# Patient Record
Sex: Female | Born: 1985 | Race: White | Hispanic: No | Marital: Single | Smoking: Current some day smoker
Health system: Southern US, Community
[De-identification: ages and names within clinical notes are randomized; demographics above are authoritative.]

## PROBLEM LIST (undated history)

## (undated) DIAGNOSIS — F319 Bipolar disorder, unspecified: Secondary | ICD-10-CM

## (undated) DIAGNOSIS — J449 Chronic obstructive pulmonary disease, unspecified: Secondary | ICD-10-CM

## (undated) DIAGNOSIS — I509 Heart failure, unspecified: Secondary | ICD-10-CM

## (undated) DIAGNOSIS — F17200 Nicotine dependence, unspecified, uncomplicated: Secondary | ICD-10-CM

## (undated) DIAGNOSIS — M5417 Radiculopathy, lumbosacral region: Secondary | ICD-10-CM

## (undated) DIAGNOSIS — M5030 Other cervical disc degeneration, unspecified cervical region: Secondary | ICD-10-CM

## (undated) DIAGNOSIS — F411 Generalized anxiety disorder: Secondary | ICD-10-CM

## (undated) DIAGNOSIS — J45909 Unspecified asthma, uncomplicated: Secondary | ICD-10-CM

## (undated) DIAGNOSIS — B1920 Unspecified viral hepatitis C without hepatic coma: Secondary | ICD-10-CM

## (undated) DIAGNOSIS — G5600 Carpal tunnel syndrome, unspecified upper limb: Secondary | ICD-10-CM

## (undated) DIAGNOSIS — M5136 Other intervertebral disc degeneration, lumbar region: Secondary | ICD-10-CM

## (undated) HISTORY — PX: TONSILLECTOMY: SUR1361

## (undated) HISTORY — PX: TUBAL LIGATION: SHX77

## (undated) HISTORY — DX: Carpal tunnel syndrome, unspecified upper limb: G56.00

## (undated) HISTORY — DX: Radiculopathy, lumbosacral region: M54.17

## (undated) HISTORY — DX: Generalized anxiety disorder: F41.1

## (undated) HISTORY — DX: Other cervical disc degeneration, unspecified cervical region: M50.30

## (undated) HISTORY — DX: Nicotine dependence, unspecified, uncomplicated: F17.200

## (undated) HISTORY — DX: Other intervertebral disc degeneration, lumbar region: M51.36

## (undated) HISTORY — DX: Unspecified asthma, uncomplicated: J45.909

## (undated) HISTORY — DX: Unspecified viral hepatitis C without hepatic coma: B19.20

## (undated) HISTORY — PX: CERVICAL DISC ARTHROPLASTY: SHX587

## (undated) HISTORY — DX: Bipolar disorder, unspecified: F31.9

---
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
## 2009-09-03 DIAGNOSIS — F411 Generalized anxiety disorder: Secondary | ICD-10-CM

## 2009-09-03 HISTORY — DX: Generalized anxiety disorder: F41.1

## 2012-09-06 DIAGNOSIS — F17200 Nicotine dependence, unspecified, uncomplicated: Secondary | ICD-10-CM

## 2012-09-06 HISTORY — DX: Nicotine dependence, unspecified, uncomplicated: F17.200

## 2012-11-23 DIAGNOSIS — J45909 Unspecified asthma, uncomplicated: Secondary | ICD-10-CM | POA: Insufficient documentation

## 2012-11-23 DIAGNOSIS — M436 Torticollis: Secondary | ICD-10-CM | POA: Insufficient documentation

## 2012-11-23 DIAGNOSIS — M542 Cervicalgia: Secondary | ICD-10-CM | POA: Insufficient documentation

## 2012-12-18 DIAGNOSIS — B1920 Unspecified viral hepatitis C without hepatic coma: Secondary | ICD-10-CM | POA: Insufficient documentation

## 2013-02-09 DIAGNOSIS — F319 Bipolar disorder, unspecified: Secondary | ICD-10-CM

## 2013-02-09 HISTORY — DX: Bipolar disorder, unspecified: F31.9

## 2013-03-02 DIAGNOSIS — F329 Major depressive disorder, single episode, unspecified: Secondary | ICD-10-CM | POA: Insufficient documentation

## 2013-03-02 DIAGNOSIS — G8929 Other chronic pain: Secondary | ICD-10-CM | POA: Insufficient documentation

## 2013-04-20 ENCOUNTER — Emergency Department: Payer: Self-pay | Admitting: Emergency Medicine

## 2013-04-20 LAB — CBC WITH DIFFERENTIAL/PLATELET
Basophil #: 0.1 10*3/uL (ref 0.0–0.1)
Basophil %: 0.5 %
Eosinophil #: 0.1 10*3/uL (ref 0.0–0.7)
Eosinophil %: 1 %
HCT: 38.2 % (ref 35.0–47.0)
HGB: 13.5 g/dL (ref 12.0–16.0)
Lymphocyte #: 3 10*3/uL (ref 1.0–3.6)
Lymphocyte %: 21.9 %
MCH: 33.5 pg (ref 26.0–34.0)
MCHC: 35.2 g/dL (ref 32.0–36.0)
MCV: 95 fL (ref 80–100)
Monocyte #: 0.9 x10 3/mm (ref 0.2–0.9)
Monocyte %: 6.4 %
Neutrophil #: 9.4 10*3/uL — ABNORMAL HIGH (ref 1.4–6.5)
Neutrophil %: 70.2 %
Platelet: 169 10*3/uL (ref 150–440)
RBC: 4.02 10*6/uL (ref 3.80–5.20)
RDW: 12.8 % (ref 11.5–14.5)
WBC: 13.4 10*3/uL — ABNORMAL HIGH (ref 3.6–11.0)

## 2013-04-20 LAB — BASIC METABOLIC PANEL
Anion Gap: 4 — ABNORMAL LOW (ref 7–16)
BUN: 10 mg/dL (ref 7–18)
Calcium, Total: 8.6 mg/dL (ref 8.5–10.1)
Chloride: 108 mmol/L — ABNORMAL HIGH (ref 98–107)
Co2: 27 mmol/L (ref 21–32)
Creatinine: 0.86 mg/dL (ref 0.60–1.30)
EGFR (African American): >60
EGFR (Non-African Amer.): >60
Glucose: 101 mg/dL — ABNORMAL HIGH (ref 65–99)
Osmolality: 277 (ref 275–301)
Potassium: 3.8 mmol/L (ref 3.5–5.1)
Sodium: 139 mmol/L (ref 136–145)

## 2013-04-20 LAB — GC/CHLAMYDIA PROBE AMP

## 2013-04-21 ENCOUNTER — Emergency Department: Payer: Self-pay | Admitting: Emergency Medicine

## 2013-04-25 LAB — CULTURE, BLOOD (SINGLE)

## 2013-09-03 ENCOUNTER — Ambulatory Visit: Payer: Self-pay | Admitting: Physician Assistant

## 2013-12-27 ENCOUNTER — Other Ambulatory Visit: Payer: Self-pay

## 2013-12-27 LAB — SEDIMENTATION RATE: ERYTHROCYTE SED RATE: 5 mm/h (ref 0–20)

## 2013-12-27 LAB — CBC WITH DIFFERENTIAL/PLATELET
BASOS ABS: 0.1 10*3/uL (ref 0.0–0.1)
Basophil %: 0.6 %
EOS ABS: 0.2 10*3/uL (ref 0.0–0.7)
Eosinophil %: 1.4 %
HCT: 39.6 % (ref 35.0–47.0)
HGB: 13.4 g/dL (ref 12.0–16.0)
LYMPHS ABS: 3.8 10*3/uL — AB (ref 1.0–3.6)
Lymphocyte %: 32.8 %
MCH: 33 pg (ref 26.0–34.0)
MCHC: 33.9 g/dL (ref 32.0–36.0)
MCV: 97 fL (ref 80–100)
MONO ABS: 0.6 x10 3/mm (ref 0.2–0.9)
Monocyte %: 5.4 %
Neutrophil #: 7 10*3/uL — ABNORMAL HIGH (ref 1.4–6.5)
Neutrophil %: 59.8 %
Platelet: 210 10*3/uL (ref 150–440)
RBC: 4.08 10*6/uL (ref 3.80–5.20)
RDW: 13.2 % (ref 11.5–14.5)
WBC: 11.6 10*3/uL — ABNORMAL HIGH (ref 3.6–11.0)

## 2014-03-15 ENCOUNTER — Emergency Department: Payer: Self-pay | Admitting: Emergency Medicine

## 2014-03-22 ENCOUNTER — Emergency Department: Payer: Self-pay | Admitting: Internal Medicine

## 2014-04-25 ENCOUNTER — Emergency Department: Payer: Self-pay | Admitting: Emergency Medicine

## 2014-05-08 ENCOUNTER — Ambulatory Visit: Payer: Self-pay | Admitting: Pain Medicine

## 2014-05-16 ENCOUNTER — Emergency Department: Payer: Self-pay | Admitting: Emergency Medicine

## 2014-06-03 ENCOUNTER — Ambulatory Visit: Payer: Self-pay | Admitting: Pain Medicine

## 2014-06-04 ENCOUNTER — Ambulatory Visit: Payer: Self-pay | Admitting: Pain Medicine

## 2014-07-09 ENCOUNTER — Ambulatory Visit: Payer: Self-pay | Admitting: Pain Medicine

## 2014-07-24 ENCOUNTER — Ambulatory Visit: Payer: Self-pay | Admitting: Pain Medicine

## 2014-08-12 ENCOUNTER — Emergency Department: Payer: Self-pay | Admitting: Student

## 2014-08-12 LAB — CBC WITH DIFFERENTIAL/PLATELET
BASOS ABS: 0 10*3/uL (ref 0.0–0.1)
Basophil %: 0.3 %
EOS ABS: 0.3 10*3/uL (ref 0.0–0.7)
Eosinophil %: 1.7 %
HCT: 41.1 % (ref 35.0–47.0)
HGB: 14.1 g/dL (ref 12.0–16.0)
Lymphocyte #: 3.3 10*3/uL (ref 1.0–3.6)
Lymphocyte %: 20.6 %
MCH: 32.8 pg (ref 26.0–34.0)
MCHC: 34.3 g/dL (ref 32.0–36.0)
MCV: 96 fL (ref 80–100)
MONO ABS: 1.1 x10 3/mm — AB (ref 0.2–0.9)
Monocyte %: 6.6 %
NEUTROS ABS: 11.3 10*3/uL — AB (ref 1.4–6.5)
Neutrophil %: 70.8 %
Platelet: 173 10*3/uL (ref 150–440)
RBC: 4.3 10*6/uL (ref 3.80–5.20)
RDW: 12.3 % (ref 11.5–14.5)
WBC: 16 10*3/uL — ABNORMAL HIGH (ref 3.6–11.0)

## 2014-08-12 LAB — COMPREHENSIVE METABOLIC PANEL
ALK PHOS: 64 U/L (ref 46–116)
ANION GAP: 8 (ref 7–16)
AST: 60 U/L — AB (ref 15–37)
Albumin: 3.8 g/dL (ref 3.4–5.0)
BILIRUBIN TOTAL: 0.6 mg/dL (ref 0.2–1.0)
BUN: 16 mg/dL (ref 7–18)
CALCIUM: 9.3 mg/dL (ref 8.5–10.1)
Chloride: 107 mmol/L (ref 98–107)
Co2: 27 mmol/L (ref 21–32)
Creatinine: 0.9 mg/dL (ref 0.60–1.30)
EGFR (African American): >60
EGFR (Non-African Amer.): >60
Glucose: 98 mg/dL (ref 65–99)
Osmolality: 284 (ref 275–301)
Potassium: 4 mmol/L (ref 3.5–5.1)
SGPT (ALT): 172 U/L — ABNORMAL HIGH (ref 14–63)
Sodium: 142 mmol/L (ref 136–145)
Total Protein: 7.2 g/dL (ref 6.4–8.2)

## 2014-08-12 LAB — WET PREP, GENITAL

## 2014-08-12 LAB — HCG, QUANTITATIVE, PREGNANCY: Beta Hcg, Quant.: <1 m[IU]/mL — ABNORMAL LOW

## 2014-08-12 LAB — TROPONIN I

## 2014-08-13 LAB — GC/CHLAMYDIA PROBE AMP

## 2014-09-19 ENCOUNTER — Ambulatory Visit: Payer: Self-pay | Admitting: Pain Medicine

## 2014-09-30 ENCOUNTER — Ambulatory Visit: Payer: Self-pay | Admitting: Pain Medicine

## 2014-10-10 DIAGNOSIS — G5600 Carpal tunnel syndrome, unspecified upper limb: Secondary | ICD-10-CM | POA: Insufficient documentation

## 2014-10-10 HISTORY — DX: Carpal tunnel syndrome, unspecified upper limb: G56.00

## 2014-10-15 ENCOUNTER — Other Ambulatory Visit
Admit: 2014-10-15 | Disposition: A | Discharge status: Home / Self Care | Payer: Self-pay | Attending: Urgent Care | Admitting: Urgent Care

## 2014-10-15 LAB — CBC WITH DIFFERENTIAL/PLATELET
BASOS ABS: 0.1 10*3/uL (ref 0.0–0.1)
Basophil %: 0.6 %
EOS ABS: 0.2 10*3/uL (ref 0.0–0.7)
EOS PCT: 1.3 %
HCT: 39.1 % (ref 35.0–47.0)
HGB: 13 g/dL (ref 12.0–16.0)
LYMPHS PCT: 23.7 %
Lymphocyte #: 3 10*3/uL (ref 1.0–3.6)
MCH: 32.7 pg (ref 26.0–34.0)
MCHC: 33.4 g/dL (ref 32.0–36.0)
MCV: 98 fL (ref 80–100)
MONOS PCT: 5.5 %
Monocyte #: 0.7 x10 3/mm (ref 0.2–0.9)
Neutrophil #: 8.8 10*3/uL — ABNORMAL HIGH (ref 1.4–6.5)
Neutrophil %: 68.9 %
PLATELETS: 175 10*3/uL (ref 150–440)
RBC: 3.99 10*6/uL (ref 3.80–5.20)
RDW: 13 % (ref 11.5–14.5)
WBC: 12.7 10*3/uL — ABNORMAL HIGH (ref 3.6–11.0)

## 2014-10-15 LAB — HEPATIC FUNCTION PANEL A (ARMC)
AST: 27 U/L
Albumin: 3.8 g/dL
Alkaline Phosphatase: 70 U/L
Bilirubin,Total: 0.2 mg/dL — ABNORMAL LOW
Indirect Bilirubin: 0.2
SGPT (ALT): 40 U/L
Total Protein: 6.8 g/dL

## 2014-10-31 ENCOUNTER — Ambulatory Visit
Admit: 2014-10-31 | Disposition: A | Discharge status: Home / Self Care | Payer: Self-pay | Attending: Urgent Care | Admitting: Urgent Care

## 2014-10-31 ENCOUNTER — Ambulatory Visit
Admit: 2014-10-31 | Disposition: A | Discharge status: Home / Self Care | Payer: Self-pay | Attending: Pain Medicine | Admitting: Pain Medicine

## 2014-11-13 ENCOUNTER — Encounter: Payer: Self-pay | Admitting: Pain Medicine

## 2014-11-13 ENCOUNTER — Ambulatory Visit: Payer: BLUE CROSS/BLUE SHIELD | Attending: Pain Medicine | Admitting: Pain Medicine

## 2014-11-13 VITALS — BP 107/70 | HR 75 | Temp 98.4°F | Resp 16 | Ht 63.0 in | Wt 145.0 lb

## 2014-11-13 DIAGNOSIS — R0782 Intercostal pain: Secondary | ICD-10-CM

## 2014-11-13 DIAGNOSIS — M5417 Radiculopathy, lumbosacral region: Secondary | ICD-10-CM

## 2014-11-13 DIAGNOSIS — M19111 Post-traumatic osteoarthritis, right shoulder: Secondary | ICD-10-CM

## 2014-11-13 DIAGNOSIS — M5136 Other intervertebral disc degeneration, lumbar region: Secondary | ICD-10-CM

## 2014-11-13 DIAGNOSIS — M5030 Other cervical disc degeneration, unspecified cervical region: Secondary | ICD-10-CM | POA: Insufficient documentation

## 2014-11-13 DIAGNOSIS — M5442 Lumbago with sciatica, left side: Secondary | ICD-10-CM | POA: Diagnosis not present

## 2014-11-13 DIAGNOSIS — M51369 Other intervertebral disc degeneration, lumbar region without mention of lumbar back pain or lower extremity pain: Secondary | ICD-10-CM

## 2014-11-13 DIAGNOSIS — M545 Low back pain: Secondary | ICD-10-CM | POA: Diagnosis present

## 2014-11-13 HISTORY — DX: Other intervertebral disc degeneration, lumbar region without mention of lumbar back pain or lower extremity pain: M51.369

## 2014-11-13 HISTORY — DX: Other cervical disc degeneration, unspecified cervical region: M50.30

## 2014-11-13 HISTORY — DX: Radiculopathy, lumbosacral region: M54.17

## 2014-11-13 HISTORY — DX: Other intervertebral disc degeneration, lumbar region: M51.36

## 2014-11-13 MED ORDER — LIDOCAINE HCL (PF) 1 % IJ SOLN
INTRAMUSCULAR | Status: AC
  Administered 2014-11-13: 12:00:00
  Filled 2014-11-13: qty 5

## 2014-11-13 MED ORDER — ORPHENADRINE CITRATE 30 MG/ML IJ SOLN
INTRAMUSCULAR | Status: AC
  Administered 2014-11-13: 12:00:00
  Filled 2014-11-13: qty 2

## 2014-11-13 MED ORDER — TRIAMCINOLONE ACETONIDE 40 MG/ML IJ SUSP
INTRAMUSCULAR | Status: AC
  Administered 2014-11-13: 12:00:00
  Filled 2014-11-13: qty 1

## 2014-11-13 MED ORDER — BUPIVACAINE HCL (PF) 0.25 % IJ SOLN: 30.0000 mL | Freq: Once | INTRAMUSCULAR | Status: DC

## 2014-11-13 MED ORDER — FENTANYL CITRATE (PF) 100 MCG/2ML IJ SOLN: 100.0000 ug | INTRAMUSCULAR | Status: DC

## 2014-11-13 MED ORDER — BUPIVACAINE HCL (PF) 0.25 % IJ SOLN
INTRAMUSCULAR | Status: AC
  Administered 2014-11-13: 12:00:00
  Filled 2014-11-13: qty 30

## 2014-11-13 MED ORDER — FENTANYL CITRATE (PF) 100 MCG/2ML IJ SOLN
INTRAMUSCULAR | Status: AC
  Administered 2014-11-13: 100 ug
  Filled 2014-11-13: qty 2

## 2014-11-13 MED ORDER — LIDOCAINE HCL (PF) 1 % IJ SOLN: 5.0000 mL | Freq: Once | INTRAMUSCULAR | Status: DC

## 2014-11-13 MED ORDER — ORPHENADRINE CITRATE 30 MG/ML IJ SOLN: 60.0000 mg | Freq: Once | INTRAMUSCULAR | Status: DC

## 2014-11-13 MED ORDER — MIDAZOLAM HCL 5 MG/5ML IJ SOLN
INTRAMUSCULAR | Status: AC
  Administered 2014-11-13: 3 mg
  Filled 2014-11-13: qty 5

## 2014-11-13 MED ORDER — SODIUM CHLORIDE 0.9 % IJ SOLN: 10.0000 mL | Freq: Once | INTRAMUSCULAR | Status: DC

## 2014-11-13 MED ORDER — TRIAMCINOLONE ACETONIDE 40 MG/ML IJ SUSP
INTRAMUSCULAR | Status: AC
  Filled 2014-11-13: qty 1

## 2014-11-13 MED ORDER — LACTATED RINGERS IV SOLN: 1000.0000 mL | INTRAVENOUS | Status: DC

## 2014-11-13 MED ORDER — TRIAMCINOLONE ACETONIDE 40 MG/ML IJ SUSP: 40.0000 mg | Freq: Once | INTRAMUSCULAR | Status: DC

## 2014-11-13 MED ORDER — SODIUM CHLORIDE 0.9 % IJ SOLN
INTRAMUSCULAR | Status: AC
  Administered 2014-11-13: 20 mL
  Filled 2014-11-13: qty 20

## 2014-11-13 MED ORDER — MIDAZOLAM HCL 5 MG/5ML IJ SOLN: 5.0000 mg | INTRAMUSCULAR | Status: DC

## 2014-11-13 NOTE — Procedures (Signed)
PROCEDURE PERFORMED: Lumbar epidural steroid injection   NOTE: The patient is a 29 y.o.-year-old female who [facility] and treatment of pain involving the lumbar and lower extremity region. MRI studies have revealed the patient to be withvIMPRESSION 1. L2-3: MILD FACET PROMINENCE. 2. L3-4: BILATERAL FACET AND LIGAMENTAL FLAVAL PROMINENCE. 3. L4-5: BROAD BASED ANNULAR BULGE. 4. L5-S1: CENTRAL BROAD BASED BULGE.IMPRESSION 1. L2-3: MILD FACET PROMINENCE. 2. L3-4: BILATERAL FACET AND LIGAMENTAL FLAVAL PROMINENCE. 3. L4-5: BROAD BASED ANNULAR BULGE. 4. L5-S1: CENTRAL BROAD BASED BULGE. Marland Kitchen The risks, benefits, and expectations of the procedure have been discussed and explained to the patient who was understanding and in agreement with suggested treatment plan. We will proceed with interventional treatment as discussed and explained to the patient who is willing to proceed with procedure as planned.   DESCRIPTION OF PROCEDURE: Lumbar epidural steroid injection with IV Versed, IV fentanyl conscious sedation, EKG, blood pressure, pulse, and pulse oximetry monitoring. The procedure was performed with the patient in the prone position under fluoroscopic guidance. A local anesthetic skin wheal of 1.5% plain lidocaine was accomplished at proposed entry site. An 18-gauge Tuohy epidural needle was inserted at the L 5 vertebral body level left of the midline via loss-of-resistance technique with negative heme and negative CSF return. A total of 4 mL of Preservative-Free normal saline with 40 mg of Kenalog injected incrementally via epidurally placed needle. Needle removed. The patient tolerated the injection well.   PLAN:   1. Medications: We will continue presently prescribed medications. 2. Will consider modification of treatment regimen pending response to treatment rendered on today's visit and follow-up evaluation. 3. The patient is to follow-up  with primary care physician regarding blood pressure and general medical condition status post lumbar epidural steroid injection performed on today's visit. 4. Surgical evaluation. 5. Neurological evaluation. 6. The patient may be a candidate for radiofrequency procedures, implantation device, and other treatment pending response to treatment and follow-up evaluation. 7. The patient has been advised to adhere to proper body mechanics and avoid activities which appear to aggravate condition. 8. The patient has been advised to call the [facility] prior to scheduled return appointment should there be significant change in condition or should there be significant  1. Medications: We will continue presently prescribed medications.  2. Will consider modification of treatment regimen pending response to treatment rendered on today's visit and follow-up evaluation.  3. The patient is to follow-up with primary care physician regarding blood pressure and general medical condition status post lumbar epidural steroid injection performed on today's visit.  4. Surgical evaluation.  5. Neurological evaluation. 6. The patient may be a candidate for radiofrequency procedures, implantation device, and other treatment pending response to treatment and follow-up evaluation.  7. The patient has been advised to adhere to proper body mechanics and avoid activities which appear to aggravate condition.  8. The patient has been advised to call the [facility] prior to scheduled return appointment should there be significant change in condition or should should patient have other concerns regarding condition prior to scheduled return appointment.  The patient is understanding and in agreement with suggested treatment plan.

## 2014-11-13 NOTE — Progress Notes (Signed)
   Subjective:    Patient ID: Jennifer Vasquez, female    DOB: [birth year], 29 y.o.   MRN: [id number]  HPI    Review of Systems     Objective:   Physical Exam        Assessment & Plan:

## 2014-11-13 NOTE — Progress Notes (Signed)
   Subjective:    Patient ID: Jennifer Vasquez, female    DOB: 02/15/1986, 28 y.o.   MRN: [id number]  HPI    Review of Systems     Objective:   Physical Exam        Assessment & Plan:   

## 2014-11-13 NOTE — Patient Instructions (Addendum)
F/U  DR E TURNER FOR EVAL BP AND GENERAL  CONDITIONS  F/U SURGICAL AND NEUROLOGICAL EVAL   AS DISCUSSED  F/U PSYCH EVAL  AS DISCUSSED  F/U EVAL IN 3 - 4 WKS    Epidural Steroid Injection, Care After  Refer to this sheet in the next few weeks. These instructions provide you with information on caring for yourself after your procedure. Your health care provider may also give you more specific instructions. Your treatment has been planned according to current medical practices, but problems sometimes occur. Call your health care provider if you have any problems or questions after your procedure. WHAT TO EXPECT AFTER THE PROCEDURE After your procedure, it is typical to have minimal discomfort at the injection site. HOME CARE INSTRUCTIONS   Avoid the use of heat on the injection site for 1 day.  Do not take a tub bath or soak in water the day of the procedure.  Remove the bandage on the day after the procedure.  Resume your normal activities the day after the procedure.  Apply ice to reduce the soreness around the injection site:  Put ice in a plastic bag.  Place a towel between your skin and the bag.  Leave the ice on for 20 minutes, 2-3 times a day.  Follow up with your health care provider 7-10 days after the procedure. SEEK MEDICAL CARE IF:   You have a fever.  You continue to have pain and soreness around the injection site, even after taking medicines.  You have significant nausea or vomiting.  You have a severe headache. SEEK IMMEDIATE MEDICAL CARE IF:   You have severe pain at the injection site, which is not relieved by medicines.  You have a severe headache, stiff neck, or sensitivity to light.  You have any new numbness or weakness in your legs or arms.  You lose control over your bladder or bowel movements.  You have difficulty breathing. Document Released: 10/13/2010 Document Revised: 02/28/2013 Document Reviewed: 12/15/2012 [facility] Patient Information  2015 [place], [place]. This information is not intended to replace advice given to you by your health care provider. Make sure you discuss any questions you have with your health care provider.

## 2014-11-13 NOTE — Progress Notes (Signed)
Discharged to home via w/c with mother-in-law at 12:35 pm. Tolerating po fluids. Instructions given; teachback x3.

## 2014-11-14 ENCOUNTER — Telehealth: Payer: Self-pay | Admitting: *Deleted

## 2014-11-14 NOTE — Telephone Encounter (Signed)
Left voice mail

## 2014-12-15 ENCOUNTER — Other Ambulatory Visit: Payer: Self-pay | Admitting: Pain Medicine

## 2014-12-15 DIAGNOSIS — M5136 Other intervertebral disc degeneration, lumbar region: Secondary | ICD-10-CM

## 2014-12-15 DIAGNOSIS — M5030 Other cervical disc degeneration, unspecified cervical region: Secondary | ICD-10-CM

## 2014-12-15 DIAGNOSIS — M5417 Radiculopathy, lumbosacral region: Secondary | ICD-10-CM

## 2014-12-17 ENCOUNTER — Emergency Department: Payer: BLUE CROSS/BLUE SHIELD

## 2014-12-17 ENCOUNTER — Encounter: Payer: Self-pay | Admitting: *Deleted

## 2014-12-17 ENCOUNTER — Emergency Department
Admission: EM | Admit: 2014-12-17 | Discharge: 2014-12-17 | Disposition: A | Discharge status: Home / Self Care | Payer: BLUE CROSS/BLUE SHIELD | Attending: Emergency Medicine | Admitting: Emergency Medicine

## 2014-12-17 DIAGNOSIS — Z3202 Encounter for pregnancy test, result negative: Secondary | ICD-10-CM | POA: Diagnosis not present

## 2014-12-17 DIAGNOSIS — Z79899 Other long term (current) drug therapy: Secondary | ICD-10-CM | POA: Diagnosis not present

## 2014-12-17 DIAGNOSIS — Z880 Allergy status to penicillin: Secondary | ICD-10-CM | POA: Insufficient documentation

## 2014-12-17 DIAGNOSIS — Z720 Tobacco use: Secondary | ICD-10-CM | POA: Insufficient documentation

## 2014-12-17 DIAGNOSIS — N760 Acute vaginitis: Secondary | ICD-10-CM | POA: Insufficient documentation

## 2014-12-17 DIAGNOSIS — R109 Unspecified abdominal pain: Secondary | ICD-10-CM

## 2014-12-17 DIAGNOSIS — N938 Other specified abnormal uterine and vaginal bleeding: Secondary | ICD-10-CM | POA: Diagnosis not present

## 2014-12-17 DIAGNOSIS — B9689 Other specified bacterial agents as the cause of diseases classified elsewhere: Secondary | ICD-10-CM

## 2014-12-17 LAB — CHLAMYDIA/NGC RT PCR (ARMC ONLY)
CHLAMYDIA TR: NOT DETECTED
N gonorrhoeae: NOT DETECTED

## 2014-12-17 LAB — WET PREP, GENITAL
TRICH WET PREP: NONE SEEN
Yeast Wet Prep HPF POC: NONE SEEN

## 2014-12-17 LAB — POCT PREGNANCY, URINE: Preg Test, Ur: NEGATIVE

## 2014-12-17 MED ORDER — MORPHINE SULFATE 4 MG/ML IJ SOLN
4.0000 mg | Freq: Once | INTRAMUSCULAR | Status: AC
  Administered 2014-12-17: 4 mg via INTRAMUSCULAR

## 2014-12-17 MED ORDER — METRONIDAZOLE 500 MG PO TABS: 500.0000 mg | ORAL_TABLET | Freq: Two times a day (BID) | ORAL | Status: DC

## 2014-12-17 MED ORDER — NAPROXEN 500 MG PO TABS: 500.0000 mg | ORAL_TABLET | Freq: Two times a day (BID) | ORAL | Status: DC

## 2014-12-17 MED ORDER — OXYCODONE HCL 5 MG PO TABS
5.0000 mg | ORAL_TABLET | Freq: Once | ORAL | Status: AC
  Administered 2014-12-17: 5 mg via ORAL

## 2014-12-17 MED ORDER — MORPHINE SULFATE 4 MG/ML IJ SOLN
INTRAMUSCULAR | Status: AC
  Administered 2014-12-17: 4 mg via INTRAMUSCULAR
  Filled 2014-12-17: qty 1

## 2014-12-17 MED ORDER — OXYCODONE HCL 5 MG PO TABS
ORAL_TABLET | ORAL | Status: AC
  Administered 2014-12-17: 5 mg via ORAL
  Filled 2014-12-17: qty 1

## 2014-12-17 NOTE — Discharge Instructions (Signed)
Pelvic Pain Pelvic pain is pain felt below the belly button and between your hips. It can be caused by many different things. It is important to get help right away. This is especially true for severe, sharp, or unusual pain that comes on suddenly.  HOME CARE  Only take medicine as told by your doctor.  Rest as told by your doctor.  Eat a healthy diet, such as fruits, vegetables, and lean meats.  Drink enough fluids to keep your pee (urine) clear or pale yellow, or as told.  Avoid sex (intercourse) if it causes pain.  Apply warm or cold packs to your lower belly (abdomen). Use the type of pack that helps the pain.  Avoid situations that cause you stress.  Keep a journal to track your pain. Write down:  When the pain started.  Where it is located.  If there are things that seem to be related to the pain, such as food or your period.  Follow up with your doctor as told. GET HELP RIGHT AWAY IF:   You have heavy bleeding from the vagina.  You have more pelvic pain.  You feel lightheaded or pass out (faint).  You have chills.  You have pain when you pee or have blood in your pee.  You cannot stop having watery poop (diarrhea).  You cannot stop throwing up (vomiting).  You have a fever or lasting symptoms for more than 3 days.  You have a fever and your symptoms suddenly get worse.  You are being physically or sexually abused.  Your medicine does not help your pain.  You have fluid (discharge) coming from your vagina that is not normal. MAKE SURE YOU:  Understand these instructions.  Will watch your condition.  Will get help if you are not doing well or get worse. Document Released: 12/15/2007 Document Revised: 12/28/2011 Document Reviewed: 10/18/2011 [employer] Patient Information 2015 [employer]. This information is not intended to replace advice given to you by your health care provider. Make sure you discuss any questions you have with your health care  provider.  

## 2014-12-17 NOTE — ED Notes (Signed)
Pt informed to return if any life threatening symptoms occur.  

## 2014-12-17 NOTE — ED Provider Notes (Signed)
[facility] Emergency Department Provider Note  ____________________________________________  Time seen: Approximately 1250 PM  I have reviewed the triage vital signs and the nursing notes.   HISTORY  Chief Complaint Vaginal Bleeding    HPI SERI KIMMER is a 29 y.o. female history of chronic back pain presents today with 1 day of vaginal discharge and bleeding with suprapubic abdominal pain. She said she was having a white discharge yesterday and then had cramping to the lower abdomen with bleeding and passing clots today. She says that she has a regular period with only certain the 24th of the month. She usually does not have any abnormal bleeding. She says that her mother had to have her uterus out at about this age but does not know why. Has one sexual partner which she has been exclusive with for the past 10 years. No history of STDs.Mild pain with urination.   Past Medical History  Diagnosis Date  . Asthma   . Hepatitis C     Patient Active Problem List   Diagnosis Date Noted  . DDD (degenerative disc disease), lumbar 11/13/2014  . DDD (degenerative disc disease), cervical 11/13/2014  . Lumbosacral radiculopathy 11/13/2014    Past Surgical History  Procedure Laterality Date  . Tonsillectomy    . Tubal ligation      Current Outpatient Rx  Name  Route  Sig  Dispense  Refill  . albuterol-ipratropium (COMBIVENT) 18-103 MCG/ACT inhaler   Inhalation   Inhale 2 puffs into the lungs 3 (three) times daily.         Marland Kitchen ibuprofen (ADVIL,MOTRIN) 200 MG tablet   Oral   Take 800 mg by mouth every 6 (six) hours as needed.         . traZODone (DESYREL) 50 MG tablet   Oral   Take 50 mg by mouth at bedtime as needed for sleep.           Allergies Acetaminophen; Amoxicillin-pot clavulanate; Gabapentin; Tramadol; and Shellfish allergy  Family History  Problem Relation Age of Onset  . Arthritis Mother   . Asthma Mother   . Diabetes Mother    . Hyperlipidemia Mother   . Vision loss Mother   . Alcohol abuse Father   . Asthma Father   . Diabetes Father   . Heart disease Father   . Depression Father   . COPD Father   . Cancer Father   . Hypertension Father   . Kidney disease Father   . Vision loss Father     Social History History  Substance Use Topics  . Smoking status: Current Some Day Smoker -- 5.00 packs/day    Types: Cigarettes  . Smokeless tobacco: Not on file  . Alcohol Use: No    Review of Systems Constitutional: No fever/chills Eyes: No visual changes. ENT: No sore throat. Cardiovascular: Denies chest pain. Respiratory: Denies shortness of breath. Gastrointestinal: No abdominal pain.  No nausea, no vomiting.  No diarrhea.  No constipation. Genitourinary: As above  Musculoskeletal: Negative for back pain. Skin: Negative for rash. Neurological: Negative for headaches, focal weakness or numbness.  10-point ROS otherwise negative.  ____________________________________________   PHYSICAL EXAM:  VITAL SIGNS: ED Triage Vitals  Enc Vitals Group     BP 12/17/14 0902 138/78 mmHg     Pulse Rate 12/17/14 0902 92     Resp 12/17/14 0902 18     Temp 12/17/14 0902 98.2 F (36.8 C)     Temp Source 12/17/14 0902  Oral     SpO2 12/17/14 0902 97 %     Weight 12/17/14 0902 145 lb (65.772 kg)     Height 12/17/14 0902 5\' 3"  (1.6 m)     Head Cir --      Peak Flow --      Pain Score 12/17/14 0905 6     Pain Loc --      Pain Edu? --      Excl. in GC? --     Constitutional: Alert and oriented. Well appearing and in no acute distress. Eyes: Conjunctivae are normal. PERRL. EOMI. Head: Atraumatic. Nose: No congestion/rhinnorhea. Mouth/Throat: Mucous membranes are moist.  Oropharynx non-erythematous. Neck: No stridor.   Cardiovascular: Normal rate, regular rhythm. Grossly normal heart sounds.  Good peripheral circulation. Respiratory: Normal respiratory effort.  No retractions. Lungs CTAB. Gastrointestinal:  Soft with tenderness to the suprapubic area. No right lower quadrant left lower quadrant tenderness. There is no tenderness over McBurney's point.. No distention. No abdominal bruits. No CVA tenderness. Genitourinary: Normal external exam. Speculum exam with small amount of blood in the vault. There is no active bleeding. Bimanual exam without CMT. There is uterine tenderness without any adnexal tenderness. No masses. Musculoskeletal: No lower extremity tenderness nor edema.  No joint effusions. Neurologic:  Normal speech and language. No gross focal neurologic deficits are appreciated. Speech is normal. No gait instability. Skin:  Skin is warm, dry and intact. No rash noted. Psychiatric: Mood and affect are normal. Speech and behavior are normal.  ____________________________________________   LABS (all labs ordered are listed, but only abnormal results are displayed)  Labs Reviewed  WET PREP, GENITAL - Abnormal; Notable for the following:    Clue Cells Wet Prep HPF POC MODERATE (*)    WBC, Wet Prep HPF POC FEW (*)    All other components within normal limits  CHLAMYDIA/NGC RT PCR ([facility] ONLY)  POC URINE PREG, ED  POCT PREGNANCY, URINE   ____________________________________________  EKG   ____________________________________________  RADIOLOGY  Pending ultrasound of the pelvis. ____________________________________________   PROCEDURES    ____________________________________________   INITIAL IMPRESSION / ASSESSMENT AND PLAN / ED COURSE  Pertinent labs & imaging results that were available during my care of the patient were reviewed by me and considered in my medical decision making (see chart for details).  ----------------------------------------- 3:45 PM on 12/17/2014 -----------------------------------------  Patient with likely bacterial vaginosis which is symptomatic. Patient pending ultrasound rate at this time. Dr. 02/16/2015 to follow-up with ultrasound  reviewed and reassess and disposition accordingly. He did require IM morphine in ultrasound to tolerate the procedure. ____________________________________________   FINAL CLINICAL IMPRESSION(S) / ED DIAGNOSES  Acute bacterial vaginosis. Acute suprapubic pain. Acute vaginal bleeding. Initial visit.    Cyril Loosen, MD 12/17/14 [phone]

## 2014-12-17 NOTE — ED Provider Notes (Signed)
Ultrasound results reviewed with the patient. Discussed ovarian cysts and bacterial vaginosis. She will be treated with antibiotics and pain medication. Discussed return precautions.  Jene Every, MD 12/17/14 [phone]

## 2014-12-17 NOTE — ED Notes (Signed)
Pt complains of vaginal bleeding/ pink/white discharge, pt reports pelvic pain, last cycle ended on 12/10/14

## 2014-12-19 ENCOUNTER — Ambulatory Visit: Payer: BLUE CROSS/BLUE SHIELD | Admitting: Pain Medicine

## 2015-02-24 ENCOUNTER — Ambulatory Visit
Admission: RE | Admit: 2015-02-24 | Discharge: 2015-02-24 | Disposition: A | Discharge status: Home / Self Care | Payer: Disability Insurance | Source: Ambulatory Visit | Attending: Thoracic Surgery | Admitting: Thoracic Surgery

## 2015-02-24 ENCOUNTER — Other Ambulatory Visit: Payer: Self-pay | Admitting: Thoracic Surgery

## 2015-02-24 DIAGNOSIS — M549 Dorsalgia, unspecified: Secondary | ICD-10-CM

## 2015-02-24 DIAGNOSIS — M542 Cervicalgia: Secondary | ICD-10-CM | POA: Diagnosis present

## 2015-02-24 DIAGNOSIS — M5146 Schmorl's nodes, lumbar region: Secondary | ICD-10-CM | POA: Insufficient documentation

## 2015-02-24 DIAGNOSIS — M069 Rheumatoid arthritis, unspecified: Secondary | ICD-10-CM

## 2015-03-12 ENCOUNTER — Emergency Department
Admission: EM | Admit: 2015-03-12 | Discharge: 2015-03-12 | Disposition: A | Discharge status: Home / Self Care | Payer: BLUE CROSS/BLUE SHIELD | Attending: Emergency Medicine | Admitting: Emergency Medicine

## 2015-03-12 ENCOUNTER — Emergency Department: Payer: BLUE CROSS/BLUE SHIELD

## 2015-03-12 ENCOUNTER — Encounter: Payer: Self-pay | Admitting: Student

## 2015-03-12 DIAGNOSIS — Z792 Long term (current) use of antibiotics: Secondary | ICD-10-CM | POA: Diagnosis not present

## 2015-03-12 DIAGNOSIS — Z791 Long term (current) use of non-steroidal anti-inflammatories (NSAID): Secondary | ICD-10-CM | POA: Diagnosis not present

## 2015-03-12 DIAGNOSIS — Y92008 Other place in unspecified non-institutional (private) residence as the place of occurrence of the external cause: Secondary | ICD-10-CM | POA: Diagnosis not present

## 2015-03-12 DIAGNOSIS — Z880 Allergy status to penicillin: Secondary | ICD-10-CM | POA: Insufficient documentation

## 2015-03-12 DIAGNOSIS — Z720 Tobacco use: Secondary | ICD-10-CM | POA: Diagnosis not present

## 2015-03-12 DIAGNOSIS — Z79899 Other long term (current) drug therapy: Secondary | ICD-10-CM | POA: Diagnosis not present

## 2015-03-12 DIAGNOSIS — W1839XA Other fall on same level, initial encounter: Secondary | ICD-10-CM | POA: Diagnosis not present

## 2015-03-12 DIAGNOSIS — Y9389 Activity, other specified: Secondary | ICD-10-CM | POA: Diagnosis not present

## 2015-03-12 DIAGNOSIS — Y998 Other external cause status: Secondary | ICD-10-CM | POA: Diagnosis not present

## 2015-03-12 DIAGNOSIS — S199XXA Unspecified injury of neck, initial encounter: Secondary | ICD-10-CM | POA: Diagnosis not present

## 2015-03-12 DIAGNOSIS — Z9889 Other specified postprocedural states: Secondary | ICD-10-CM | POA: Diagnosis not present

## 2015-03-12 DIAGNOSIS — M542 Cervicalgia: Secondary | ICD-10-CM

## 2015-03-12 DIAGNOSIS — Y92009 Unspecified place in unspecified non-institutional (private) residence as the place of occurrence of the external cause: Secondary | ICD-10-CM

## 2015-03-12 DIAGNOSIS — W19XXXA Unspecified fall, initial encounter: Secondary | ICD-10-CM

## 2015-03-12 MED ORDER — HYDROMORPHONE HCL 1 MG/ML IJ SOLN
INTRAMUSCULAR | Status: AC
  Filled 2015-03-12: qty 1

## 2015-03-12 MED ORDER — IPRATROPIUM-ALBUTEROL 0.5-2.5 (3) MG/3ML IN SOLN
3.0000 mL | Freq: Once | RESPIRATORY_TRACT | Status: AC
  Administered 2015-03-12: 3 mL via RESPIRATORY_TRACT

## 2015-03-12 MED ORDER — IPRATROPIUM-ALBUTEROL 0.5-2.5 (3) MG/3ML IN SOLN
RESPIRATORY_TRACT | Status: AC
  Filled 2015-03-12: qty 3

## 2015-03-12 MED ORDER — HYDROMORPHONE HCL 1 MG/ML IJ SOLN
1.0000 mg | Freq: Once | INTRAMUSCULAR | Status: AC
  Administered 2015-03-12: 1 mg via INTRAMUSCULAR

## 2015-03-12 NOTE — ED Provider Notes (Signed)
[facility] Emergency Department Provider Note  ____________________________________________  Time seen: Approximately 10:46 AM  I have reviewed the triage vital signs and the nursing notes.   HISTORY  Chief Complaint Fall    HPI Jennifer Vasquez is a 29 y.o. female who reports that she fell about this morning being awakened by the fall. Patient states that she's had previous neck surgery with disc replacement last month. Patient states that she's complaining of neck pain and is concerned about the surgery. In addition she is requesting albuterol treatment because she left her medication on file and will be short of breath.   Past Medical History  Diagnosis Date  . Asthma   . Hepatitis C     Patient Active Problem List   Diagnosis Date Noted  . DDD (degenerative disc disease), lumbar 11/13/2014  . DDD (degenerative disc disease), cervical 11/13/2014  . Lumbosacral radiculopathy 11/13/2014    Past Surgical History  Procedure Laterality Date  . Tonsillectomy    . Tubal ligation    . Cervical disc arthroplasty      Current Outpatient Rx  Name  Route  Sig  Dispense  Refill  . albuterol-ipratropium (COMBIVENT) 18-103 MCG/ACT inhaler   Inhalation   Inhale 2 puffs into the lungs 3 (three) times daily.         Marland Kitchen ibuprofen (ADVIL,MOTRIN) 200 MG tablet   Oral   Take 800 mg by mouth every 6 (six) hours as needed.         . metroNIDAZOLE (FLAGYL) 500 MG tablet   Oral   Take 1 tablet (500 mg total) by mouth 2 (two) times daily after a meal.   14 tablet   0   . naproxen (NAPROSYN) 500 MG tablet   Oral   Take 1 tablet (500 mg total) by mouth 2 (two) times daily with a meal.   20 tablet   2   . traZODone (DESYREL) 50 MG tablet   Oral   Take 50 mg by mouth at bedtime as needed for sleep.           Allergies Acetaminophen; Amoxicillin-pot clavulanate; Gabapentin; Tramadol; and Shellfish allergy  Family History  Problem Relation Age of  Onset  . Arthritis Mother   . Asthma Mother   . Diabetes Mother   . Hyperlipidemia Mother   . Vision loss Mother   . Alcohol abuse Father   . Asthma Father   . Diabetes Father   . Heart disease Father   . Depression Father   . COPD Father   . Cancer Father   . Hypertension Father   . Kidney disease Father   . Vision loss Father     Social History Social History  Substance Use Topics  . Smoking status: Current Some Day Smoker -- 5.00 packs/day    Types: Cigarettes  . Smokeless tobacco: Never Used  . Alcohol Use: No    Review of Systems Constitutional: No fever/chills Eyes: No visual changes. ENT: No sore throat. Cardiovascular: Denies chest pain. Respiratory: Mild shortness of breath with minimal wheezing. Gastrointestinal: No abdominal pain.  No nausea, no vomiting.  No diarrhea.  No constipation. Genitourinary: Negative for dysuria. Musculoskeletal: Positive for cervical spinal pain Skin: Negative for rash. Neurological: Negative for headaches, focal weakness or numbness.  10-point ROS otherwise negative.  ____________________________________________   PHYSICAL EXAM:  VITAL SIGNS: ED Triage Vitals  Enc Vitals Group     BP 03/12/15 1017 121/80 mmHg  Pulse Rate 03/12/15 1017 111     Resp 03/12/15 1017 18     Temp 03/12/15 1017 98.6 F (37 C)     Temp Source 03/12/15 1017 Oral     SpO2 03/12/15 1017 99 %     Weight 03/12/15 1017 155 lb (70.308 kg)     Height 03/12/15 1017 5\' 3"  (1.6 m)     Head Cir --      Peak Flow --      Pain Score 03/12/15 1018 8     Pain Loc --      Pain Edu? --      Excl. in GC? --     Constitutional: Alert and oriented. Well appearing and in no acute distress. Eyes: Conjunctivae are normal. PERRL. EOMI. Head: Atraumatic. Nose: No congestion/rhinnorhea. Mouth/Throat: Mucous membranes are moist.  Oropharynx non-erythematous. Neck: No stridor. Minimal cervical spinal tenderness. Full range of motion   Cardiovascular: Normal  rate, regular rhythm. Grossly normal heart sounds.  Good peripheral circulation. Respiratory: Normal respiratory effort.  No retractions. Lungs coarse breath sounds bilaterally with occasional wheezing noted Musculoskeletal: No lower extremity tenderness nor edema.  No joint effusions. Neurologic:  Normal speech and language. No gross focal neurologic deficits are appreciated. No gait instability. Skin:  Skin is warm, dry and intact. No rash noted. Psychiatric: Mood and affect are normal. Speech and behavior are normal.  ____________________________________________   LABS (all labs ordered are listed, but only abnormal results are displayed)  Labs Reviewed - No data to display ____________________________________________   RADIOLOGY  Cervical CT cage intact with screws intact. No evidence of posttraumatic falls. Interpreted by radiologist. ____________________________________________   PROCEDURES  Procedure(s) performed: None  Critical Care performed: No  ____________________________________________   INITIAL IMPRESSION / ASSESSMENT AND PLAN / ED COURSE  Pertinent labs & imaging results that were available during my care of the patient were reviewed by me and considered in my medical decision making (see chart for details).  Postoperative cervical spinal surgery with fall. CT was negative for trauma. Discharge back to neurosurgery and PCP. ____________________________________________   FINAL CLINICAL IMPRESSION(S) / ED DIAGNOSES  Final diagnoses:  Fall as cause of accidental injury in home as place of occurrence  Acute neck pain      03/14/15, PA-C 03/12/15 1205  03/14/15, MD 03/12/15 [phone]

## 2015-03-12 NOTE — ED Notes (Signed)
Pt reports falling out of bed and being awakened by the fall. States that she had back surgery in July and she is in more pain than in past. She has switched to a soft cervical collar after disc replacement of C4-6. Pt requests albuterol as she left her meds at home. NAD noted.

## 2015-03-12 NOTE — ED Notes (Signed)
Pt states fell out of bed past two nights. Denies any LOC.  Pt had disc replacement surgery 01/23/15 and is wearing neck brace.  Pt states now has a pinching sensation in shoulder. Pt talked to surgeon who recommended she come to ED.

## 2015-03-12 NOTE — Discharge Instructions (Signed)

## 2015-05-23 ENCOUNTER — Ambulatory Visit
Admission: RE | Admit: 2015-05-23 | Discharge: 2015-05-23 | Disposition: A | Discharge status: Home / Self Care | Payer: BLUE CROSS/BLUE SHIELD | Source: Ambulatory Visit | Attending: Neurosurgery | Admitting: Neurosurgery

## 2015-05-23 ENCOUNTER — Other Ambulatory Visit: Payer: Self-pay | Admitting: Neurosurgery

## 2015-05-23 DIAGNOSIS — Z981 Arthrodesis status: Secondary | ICD-10-CM | POA: Diagnosis not present

## 2015-05-23 DIAGNOSIS — M542 Cervicalgia: Secondary | ICD-10-CM | POA: Insufficient documentation

## 2015-08-31 ENCOUNTER — Emergency Department
Admission: EM | Admit: 2015-08-31 | Discharge: 2015-08-31 | Disposition: A | Discharge status: Home / Self Care | Payer: BLUE CROSS/BLUE SHIELD | Attending: Emergency Medicine | Admitting: Emergency Medicine

## 2015-08-31 DIAGNOSIS — Z791 Long term (current) use of non-steroidal anti-inflammatories (NSAID): Secondary | ICD-10-CM | POA: Insufficient documentation

## 2015-08-31 DIAGNOSIS — F17210 Nicotine dependence, cigarettes, uncomplicated: Secondary | ICD-10-CM | POA: Diagnosis not present

## 2015-08-31 DIAGNOSIS — K029 Dental caries, unspecified: Secondary | ICD-10-CM | POA: Insufficient documentation

## 2015-08-31 DIAGNOSIS — K0381 Cracked tooth: Secondary | ICD-10-CM | POA: Insufficient documentation

## 2015-08-31 DIAGNOSIS — K0889 Other specified disorders of teeth and supporting structures: Secondary | ICD-10-CM | POA: Insufficient documentation

## 2015-08-31 DIAGNOSIS — Z880 Allergy status to penicillin: Secondary | ICD-10-CM | POA: Insufficient documentation

## 2015-08-31 DIAGNOSIS — Z79899 Other long term (current) drug therapy: Secondary | ICD-10-CM | POA: Insufficient documentation

## 2015-08-31 MED ORDER — AMOXICILLIN-POT CLAVULANATE 875-125 MG PO TABS
1.0000 | ORAL_TABLET | Freq: Once | ORAL | Status: AC
  Administered 2015-08-31: 1 via ORAL
  Filled 2015-08-31: qty 1

## 2015-08-31 MED ORDER — LIDOCAINE-EPINEPHRINE 2 %-1:100000 IJ SOLN
1.7000 mL | Freq: Once | INTRAMUSCULAR | Status: AC
  Administered 2015-08-31: 1.7 mL
  Filled 2015-08-31: qty 1.7

## 2015-08-31 MED ORDER — LIDOCAINE VISCOUS 2 % MT SOLN
15.0000 mL | Freq: Once | OROMUCOSAL | Status: AC
  Administered 2015-08-31: 15 mL via OROMUCOSAL
  Filled 2015-08-31: qty 15

## 2015-08-31 MED ORDER — PENICILLIN V POTASSIUM 500 MG PO TABS: 500.0000 mg | ORAL_TABLET | Freq: Four times a day (QID) | ORAL | Status: DC

## 2015-08-31 NOTE — ED Notes (Signed)
Pt states that she cracked her bottom rt tooth a couple days ago and states that her top rt tooth is cracked and hurting worse

## 2015-08-31 NOTE — Discharge Instructions (Signed)
Dental Care and Dentist Visits °Dental care supports good overall health. Regular dental visits can also help you avoid dental pain, bleeding, infection, and other more serious health problems in the future. It is important to keep the mouth healthy because diseases in the teeth, gums, and other oral tissues can spread to other areas of the body. Some problems, such as diabetes, heart disease, and pre-term labor have been associated with poor oral health.  °See your dentist every 6 months. If you experience emergency problems such as a toothache or broken tooth, go to the dentist right away. If you see your dentist regularly, you may catch problems early. It is easier to be treated for problems in the early stages.  °WHAT TO EXPECT AT A DENTIST VISIT  °Your dentist will look for many common oral health problems and recommend proper treatment. At your regular dental visit, you can expect: °· Gentle cleaning of the teeth and gums. This includes scraping and polishing. This helps to remove the sticky substance around the teeth and gums (plaque). Plaque forms in the mouth shortly after eating. Over time, plaque hardens on the teeth as tartar. If tartar is not removed regularly, it can cause problems. Cleaning also helps remove stains. °· Periodic X-rays. These pictures of the teeth and supporting bone will help your dentist assess the health of your teeth. °· Periodic fluoride treatments. Fluoride is a natural mineral shown to help strengthen teeth. Fluoride treatment involves applying a fluoride gel or varnish to the teeth. It is most commonly done in children. °· Examination of the mouth, tongue, jaws, teeth, and gums to look for any oral health problems, such as: °· Cavities (dental caries). This is decay on the tooth caused by plaque, sugar, and acid in the mouth. It is best to catch a cavity when it is small. °· Inflammation of the gums caused by plaque buildup (gingivitis). °· Problems with the mouth or malformed  or misaligned teeth. °· Oral cancer or other diseases of the soft tissues or jaws.  °KEEP YOUR TEETH AND GUMS HEALTHY °For healthy teeth and gums, follow these general guidelines as well as your dentist's specific advice: °· Have your teeth professionally cleaned at the dentist every 6 months. °· Brush twice daily with a fluoride toothpaste. °· Floss your teeth daily.  °· Ask your dentist if you need fluoride supplements, treatments, or fluoride toothpaste. °· Eat a healthy diet. Reduce foods and drinks with added sugar. °· Avoid smoking. °TREATMENT FOR ORAL HEALTH PROBLEMS °If you have oral health problems, treatment varies depending on the conditions present in your teeth and gums. °· Your caregiver will most likely recommend good oral hygiene at each visit. °· For cavities, gingivitis, or other oral health disease, your caregiver will perform a procedure to treat the problem. This is typically done at a separate appointment. Sometimes your caregiver will refer you to another dental specialist for specific tooth problems or for surgery. °SEEK IMMEDIATE DENTAL CARE IF: °· You have pain, bleeding, or soreness in the gum, tooth, jaw, or mouth area. °· A permanent tooth becomes loose or separated from the gum socket. °· You experience a blow or injury to the mouth or jaw area. °  °This information is not intended to replace advice given to you by your health care provider. Make sure you discuss any questions you have with your health care provider. °  °Document Released: 03/10/2011 Document Revised: 09/20/2011 Document Reviewed: 03/10/2011 °Elsevier Interactive Patient Education ©2016 Elsevier Inc. ° °Dental Caries °Dental   caries is tooth decay. This decay can cause a hole in teeth (cavity) that can get bigger and deeper over time. HOME CARE  Brush and floss your teeth. Do this at least two times a day.  Use a fluoride toothpaste.  Use a mouth rinse if told by your dentist or doctor.  Eat less sugary and  starchy foods. Drink less sugary drinks.  Avoid snacking often on sugary and starchy foods. Avoid sipping often on sugary drinks.  Keep regular checkups and cleanings with your dentist.  Use fluoride supplements if told by your dentist or doctor.  Allow fluoride to be applied to teeth if told by your dentist or doctor.   This information is not intended to replace advice given to you by your health care provider. Make sure you discuss any questions you have with your health care provider.   Document Released: 04/06/2008 Document Revised: 07/19/2014 Document Reviewed: 06/30/2012 Elsevier Interactive Patient Education 2016 Elsevier Inc.  Dental Pain Dental pain may be caused by many things, including:  Tooth decay (cavities or caries). Cavities cause the nerve of your tooth to be open to air and hot or cold temperatures. This can cause pain or discomfort.  Abscess or infection. A dental abscess is an area that is full of infected pus from a bacterial infection in the inner part of the tooth (pulp). It usually happens at the end of the tooth's root.  Injury.  An unknown reason (idiopathic). Your pain may be mild or severe. It may only happen when:  You are chewing.  You are exposed to hot or cold temperature.  You are eating or drinking sugary foods or beverages, such as:  Soda.  Candy. Your pain may also be there all of the time. HOME CARE Watch your dental pain for any changes. Do these things to lessen your discomfort:  Take medicines only as told by your dentist.  If your dentist tells you to take an antibiotic medicine, finish all of it even if you start to feel better.  Keep all follow-up visits as told by your dentist. This is important.  Do not apply heat to the outside of your face.  Rinse your mouth or gargle with salt water if told by your dentist. This helps with pain and swelling.  You can make salt water by adding  tsp of salt to 1 cup of warm  water.  Apply ice to the painful area of your face:  Put ice in a plastic bag.  Place a towel between your skin and the bag.  Leave the ice on for 20 minutes, 2-3 times per day.  Avoid foods or drinks that cause you pain, such as:  Very hot or very cold foods or drinks.  Sweet or sugary foods or drinks. GET HELP IF:  Your pain is not helped with medicines.  Your symptoms are worse.  You have new symptoms. GET HELP RIGHT AWAY IF:  You cannot open your mouth.  You are having trouble breathing or swallowing.  You have a fever.  Your face, neck, or jaw is puffy (swollen).   This information is not intended to replace advice given to you by your health care provider. Make sure you discuss any questions you have with your health care provider.   Document Released: 12/15/2007 Document Revised: 11/12/2014 Document Reviewed: 06/24/2014 Elsevier Interactive Patient Education 2016 [employer].   You should take the prescription antibiotic as directed until completed. Follow-up with one of the dental clinics  listed below.   OPTIONS FOR DENTAL FOLLOW UP CARE  [place] [facility] - Local Safety Net Dental Clinics [email or web address]   [facility] [phone])  [facility] [phone])  [place] [phone])  [facility] [phone])  [facility] [phone]) This clinic caters to the indigent population and is on a lottery system. Location: [employer] [facility], [employer], [address], [place] Clinic Hours: Wednesdays from 6pm - 9pm, patients seen by a lottery system. For dates, call or go to [email or web address] Services: Cleanings, fillings and simple extractions. Payment Options: DENTAL WORK IS FREE OF CHARGE. Bring proof of income or support. Best way to get seen: Arrive at  5:15 pm - this is a lottery, NOT first come/first serve, so arriving earlier will not increase your chances of being seen.     [facility] [phone] Select option 1 for emergencies   Location: [facility], [place], [address], [place] Clinic Hours: No walk-ins accepted - call the day before to schedule an appointment. Check in times are 9:30 am and 1:30 pm. Services: Simple extractions, temporary fillings, pulpectomy/pulp debridement, uncomplicated abscess drainage. Payment Options: PAYMENT IS DUE AT THE TIME OF SERVICE.  Fee is usually $100-200, additional surgical procedures (e.g. abscess drainage) may be extra. Cash, checks, Visa/MasterCard accepted.  Can file Medicaid if patient is covered for dental - patient should call case worker to check. No discount for [facility] patients. Best way to get seen: MUST call the day before and get onto the schedule. Can usually be seen the next 1-2 days. No walk-ins accepted.     [facility] [phone]   Location: [facility], [address] [place] Clinic Hours: M, W, Th, F 8am or 1:30pm, Tues 9a or 1:30 - first come/first served. Services: Simple extractions, temporary fillings, uncomplicated abscess drainage.  You do not need to be an [facility] resident. Payment Options: PAYMENT IS DUE AT THE TIME OF SERVICE. Dental insurance, otherwise sliding scale - bring proof of income or support. Depending on income and treatment needed, cost is usually $50-200. Best way to get seen: Arrive early as it is first come/first served.     [facility] [phone]   Location: [address] Clinic Hours: Mon-Thu 8a-5p Services: Most basic dental services including extractions and fillings. Payment Options: PAYMENT IS DUE AT THE TIME OF SERVICE. Sliding scale, up to 50% off - bring proof if  income or support. Medicaid with dental option accepted. Best way to get seen: Call to schedule an appointment, can usually be seen within 2 weeks OR they will try to see walk-ins - show up at 8a or 2p (you may have to wait).     [facility] [phone] [place] RESIDENTS ONLY   Location: [facility], [address] [place], [place] [address] Clinic Hours: By appointment only. Monday - Thursday 8am-5pm, Friday 8am-12pm Services: Cleanings, fillings, extractions. Payment Options: PAYMENT IS DUE AT THE TIME OF SERVICE. Cash, Visa or MasterCard. Sliding scale - $30 minimum per service. Best way to get seen: Come in to office, complete packet and make an appointment - need proof of income or support monies for each household member and proof of [facility] residence. Usually takes about a month to get in.     [facility] [phone]  Location: [address], [employer] Clinic Hours: Walk-in Urgent Care Dental Services are offered Monday-Friday mornings only. The numbers of emergencies accepted daily is limited to the number of providers available. Maximum 15 - Mondays, Wednesdays & Thursdays Maximum 10 - Tuesdays & Fridays Services: You do not need to be a [facility] resident to be seen for a dental emergency. Emergencies are defined as pain, swelling, abnormal bleeding, or dental trauma. Walkins will receive x-rays if needed. NOTE: Dental cleaning is not an emergency. Payment Options: PAYMENT IS DUE AT THE TIME OF SERVICE. Minimum co-pay is $40.00 for uninsured patients. Minimum co-pay is $3.00 for Medicaid with dental coverage. Dental Insurance is accepted and must be presented at time of visit. Medicare does not cover dental. Forms of payment: Cash, credit card, checks. Best way to get seen: If not previously registered with the clinic, walk-in dental registration begins at 7:15 am and is on a  first come/first serve basis. If previously registered with the clinic, call to make an appointment.     [facility] [phone] [place] RESIDENTS ONLY   Location: [address] [place], [place] Clinic Hours: Mon-Thu 10a-2p Services: Extractions only! Payment Options: FREE (donations accepted) - bring proof of income or support Best way to get seen: Call and schedule an appointment OR come at 8am on the 1st Monday of every month (except for holidays) when it is first come/first served.     [facility] [phone]   Location: [address] [place], [place] Clinic Hours: Friday mornings Services, Payment Options, Best way to get seen: Call for info

## 2015-08-31 NOTE — ED Provider Notes (Signed)
[facility] Emergency Department Provider Note ____________________________________________  Time seen: 2220  I have reviewed the triage vital signs and the nursing notes.  HISTORY  Chief Complaint  Dental Pain  HPI Jennifer Vasquez is a 30 y.o. female presents to the ED for evaluation of pain to the right lower jaw and the right upper jaw after recent dental trauma. She describes it teeth have been cracked for some time, but about 2 nights ago she actually did into a small piece of bone and a pork chop she was eating, and caused another chip to her molar teeth. Since that time she's had increased pain has not responded well to ibuprofen,, Aleve, and Orajel applications. As any fevers, chills, sweats, or purulent drainage from the gums or teeth.She rates the pain in her teeth at a 10/10 in triage.  Past Medical History  Diagnosis Date  . Asthma   . Hepatitis C     Patient Active Problem List   Diagnosis Date Noted  . DDD (degenerative disc disease), lumbar 11/13/2014  . DDD (degenerative disc disease), cervical 11/13/2014  . Lumbosacral radiculopathy 11/13/2014    Past Surgical History  Procedure Laterality Date  . Tonsillectomy    . Tubal ligation    . Cervical disc arthroplasty      Current Outpatient Rx  Name  Route  Sig  Dispense  Refill  . albuterol-ipratropium (COMBIVENT) 18-103 MCG/ACT inhaler   Inhalation   Inhale 2 puffs into the lungs 3 (three) times daily.         Marland Kitchen ibuprofen (ADVIL,MOTRIN) 200 MG tablet   Oral   Take 800 mg by mouth every 6 (six) hours as needed.         . metroNIDAZOLE (FLAGYL) 500 MG tablet   Oral   Take 1 tablet (500 mg total) by mouth 2 (two) times daily after a meal.   14 tablet   0   . naproxen (NAPROSYN) 500 MG tablet   Oral   Take 1 tablet (500 mg total) by mouth 2 (two) times daily with a meal.   20 tablet   2   . penicillin v potassium (VEETID) 500 MG tablet   Oral   Take 1 tablet (500 mg  total) by mouth 4 (four) times daily.   40 tablet   0   . traZODone (DESYREL) 50 MG tablet   Oral   Take 50 mg by mouth at bedtime as needed for sleep.         Allergies Acetaminophen; Amoxicillin-pot clavulanate; Gabapentin; Tramadol; and Shellfish allergy  Family History  Problem Relation Age of Onset  . Arthritis Mother   . Asthma Mother   . Diabetes Mother   . Hyperlipidemia Mother   . Vision loss Mother   . Alcohol abuse Father   . Asthma Father   . Diabetes Father   . Heart disease Father   . Depression Father   . COPD Father   . Cancer Father   . Hypertension Father   . Kidney disease Father   . Vision loss Father    Social History Social History  Substance Use Topics  . Smoking status: Current Some Day Smoker -- 5.00 packs/day    Types: Cigarettes  . Smokeless tobacco: Never Used  . Alcohol Use: No   Review of Systems  Constitutional: Negative for fever. Eyes: Negative for visual changes. ENT: Negative for sore throat. Dental pain as above. Cardiovascular: Negative for chest pain. Respiratory: Negative for  shortness of breath. Musculoskeletal: Negative for back pain. Skin: Negative for rash. Neurological: Negative for headaches, focal weakness or numbness. ____________________________________________  PHYSICAL EXAM:  VITAL SIGNS: ED Triage Vitals  Enc Vitals Group     BP 08/31/15 2136 149/95 mmHg     Pulse Rate 08/31/15 2136 92     Resp 08/31/15 2136 18     Temp 08/31/15 2136 99.1 F (37.3 C)     Temp Source 08/31/15 2136 Oral     SpO2 08/31/15 2136 97 %     Weight 08/31/15 2136 164 lb (74.39 kg)     Height 08/31/15 2136 5\' 3"  (1.6 m)     Head Cir --      Peak Flow --      Pain Score 08/31/15 2137 10     Pain Loc --      Pain Edu? --      Excl. in GC? --    Constitutional: Alert and oriented. Well appearing and in no distress. Head: Normocephalic and atraumatic.      Eyes: Conjunctivae are normal. PERRL. Normal extraocular movements       Ears: Canals clear. TMs intact bilaterally.   Nose: No congestion/rhinorrhea.   Mouth/Throat: Mucous membranes are moist. Uvula is midline tonsils are flat and without erythema, edema, or exudate. Patient noted to have 2 chronically fractured molars, one to the upper right jaw and one to the lower right jaw. There is a cavity noted at both molars. There is no appreciable gum tenderness, swelling, fluctuance, or pointing.   Neck: Supple. No thyromegaly. Hematological/Lymphatic/Immunological: No cervical lymphadenopathy. Cardiovascular: Normal rate, regular rhythm.  Respiratory: Normal respiratory effort.  Musculoskeletal: Nontender with normal range of motion in all extremities.  Neurologic:  Normal gait without ataxia. Normal speech and language. No gross focal neurologic deficits are appreciated. Skin:  Skin is warm, dry and intact. No rash noted. Psychiatric: Mood and affect are normal. Patient exhibits appropriate insight and judgment. ____________________________________________  PROCEDURES Viscous lidocaine 2% gargle Augmentin 875 mg PO  DENTAL BLOCK  Performed by: 2138 Consent: Verbal consent obtained. Required items: devices and special equipment available Time out: Immediately prior to procedure a "time out" was called to verify the correct patient, procedure, equipment, support staff and site/side marked as required.  Indication: pain Nerve block body site: upper & lower right molars  Preparation: Patient was prepped and draped in the usual sterile fashion. Needle gauge: 27 G Location technique: anatomical landmarks  Local anesthetic: lidocaine-EPINEPHrine (XYLOCAINE W/EPI) 2 %-1:100000   Anesthetic total: 1.5 ml  Outcome: pain improved Patient tolerance: Patient tolerated the procedure well with no immediate complications. ____________________________________________  INITIAL IMPRESSION / ASSESSMENT AND PLAN / ED COURSE  Patient with  acute dental pain secondary to dental caries and fracture to the right upper and lower molars. The fracture secondary to the underlying dental caries in this particular case. Patient is discharged with her prophylactic perception for Pen-Vee K to manage any potential dental infection or abscess. She is provided with a list of dental care providers with which she may follow-up. ____________________________________________  FINAL CLINICAL IMPRESSION(S) / ED DIAGNOSES  Final diagnoses:  Pain due to dental caries  Broken or cracked tooth, nontraumatic      Lissa Hoard, PA-C 08/31/15 2352  09/02/15, MD 09/03/15 1357

## 2015-10-26 ENCOUNTER — Emergency Department
Admission: EM | Admit: 2015-10-26 | Discharge: 2015-10-26 | Disposition: A | Discharge status: Home / Self Care | Payer: BLUE CROSS/BLUE SHIELD | Attending: Emergency Medicine | Admitting: Emergency Medicine

## 2015-10-26 ENCOUNTER — Encounter: Payer: Self-pay | Admitting: Emergency Medicine

## 2015-10-26 DIAGNOSIS — B1920 Unspecified viral hepatitis C without hepatic coma: Secondary | ICD-10-CM | POA: Diagnosis not present

## 2015-10-26 DIAGNOSIS — F17210 Nicotine dependence, cigarettes, uncomplicated: Secondary | ICD-10-CM | POA: Diagnosis not present

## 2015-10-26 DIAGNOSIS — J4520 Mild intermittent asthma, uncomplicated: Secondary | ICD-10-CM | POA: Insufficient documentation

## 2015-10-26 DIAGNOSIS — Z7951 Long term (current) use of inhaled steroids: Secondary | ICD-10-CM | POA: Insufficient documentation

## 2015-10-26 DIAGNOSIS — Z791 Long term (current) use of non-steroidal anti-inflammatories (NSAID): Secondary | ICD-10-CM | POA: Diagnosis not present

## 2015-10-26 DIAGNOSIS — R0981 Nasal congestion: Secondary | ICD-10-CM | POA: Diagnosis present

## 2015-10-26 MED ORDER — PREDNISONE 20 MG PO TABS
20.0000 mg | ORAL_TABLET | Freq: Once | ORAL | Status: AC
  Administered 2015-10-26: 20 mg via ORAL
  Filled 2015-10-26: qty 1

## 2015-10-26 MED ORDER — FLUTICASONE PROPIONATE 50 MCG/ACT NA SUSP: 2.0000 | Freq: Every day | NASAL | Status: DC

## 2015-10-26 MED ORDER — PREDNISONE 10 MG PO TABS: 10.0000 mg | ORAL_TABLET | Freq: Two times a day (BID) | ORAL | Status: DC

## 2015-10-26 NOTE — ED Notes (Signed)
C/O sinus and chest congestion x 1 week.

## 2015-10-26 NOTE — ED Notes (Signed)
NAD noted at time of D/C. Pt denies questions or concerns. Pt ambulatory to the lobby at this time.  

## 2015-10-26 NOTE — Discharge Instructions (Signed)
Asthma, Acute Bronchospasm °Acute bronchospasm caused by asthma is also referred to as an asthma attack. Bronchospasm means your air passages become narrowed. The narrowing is caused by inflammation and tightening of the muscles in the air tubes (bronchi) in your lungs. This can make it hard to breathe or cause you to wheeze and cough. °CAUSES °Possible triggers are: °· Animal dander from the skin, hair, or feathers of animals. °· Dust mites contained in house dust. °· Cockroaches. °· Pollen from trees or grass. °· Mold. °· Cigarette or tobacco smoke. °· Air pollutants such as dust, household cleaners, hair sprays, aerosol sprays, paint fumes, strong chemicals, or strong odors. °· Cold air or weather changes. Cold air may trigger inflammation. Winds increase molds and pollens in the air. °· Strong emotions such as crying or laughing hard. °· Stress. °· Certain medicines such as aspirin or beta-blockers. °· Sulfites in foods and drinks, such as dried fruits and wine. °· Infections or inflammatory conditions, such as a flu, cold, or inflammation of the nasal membranes (rhinitis). °· Gastroesophageal reflux disease (GERD). GERD is a condition where stomach acid backs up into your esophagus. °· Exercise or strenuous activity. °SIGNS AND SYMPTOMS  °· Wheezing. °· Excessive coughing, particularly at night. °· Chest tightness. °· Shortness of breath. °DIAGNOSIS  °Your health care provider will ask you about your medical history and perform a physical exam. A chest X-ray or blood testing may be performed to look for other causes of your symptoms or other conditions that may have triggered your asthma attack.  °TREATMENT  °Treatment is aimed at reducing inflammation and opening up the airways in your lungs.  Most asthma attacks are treated with inhaled medicines. These include quick relief or rescue medicines (such as bronchodilators) and controller medicines (such as inhaled corticosteroids). These medicines are sometimes  given through an inhaler or a nebulizer. Systemic steroid medicine taken by mouth or given through an IV tube also can be used to reduce the inflammation when an attack is moderate or severe. Antibiotic medicines are only used if a bacterial infection is present.  °HOME CARE INSTRUCTIONS  °· Rest. °· Drink plenty of liquids. This helps the mucus to remain thin and be easily coughed up. Only use caffeine in moderation and do not use alcohol until you have recovered from your illness. °· Do not smoke. Avoid being exposed to secondhand smoke. °· You play a critical role in keeping yourself in good health. Avoid exposure to things that cause you to wheeze or to have breathing problems. °· Keep your medicines up-to-date and available. Carefully follow your health care provider's treatment plan. °· Take your medicine exactly as prescribed. °· When pollen or pollution is bad, keep windows closed and use an air conditioner or go to places with air conditioning. °· Asthma requires careful medical care. See your health care provider for a follow-up as advised. If you are more than [redacted] weeks pregnant and you were prescribed any new medicines, let your obstetrician know about the visit and how you are doing. Follow up with your health care provider as directed. °· After you have recovered from your asthma attack, make an appointment with your outpatient doctor to talk about ways to reduce the likelihood of future attacks. If you do not have a doctor who manages your asthma, make an appointment with a primary care doctor to discuss your asthma. °SEEK IMMEDIATE MEDICAL CARE IF:  °· You are getting worse. °· You have trouble breathing. If severe, call your local   emergency services (911 in the [place]  You develop chest pain or discomfort.  You are vomiting.  You are not able to keep fluids down.  You are coughing up yellow, green, brown, or bloody sputum.  You have a fever and your symptoms suddenly get worse.  You have  trouble swallowing. MAKE SURE YOU:   Understand these instructions.  Will watch your condition.  Will get help right away if you are not doing well or get worse.   This information is not intended to replace advice given to you by your health care provider. Make sure you discuss any questions you have with your health care provider.   Document Released: 10/13/2006 Document Revised: 07/03/2013 Document Reviewed: 01/03/2013 Elsevier Interactive Patient Education [employer]   Your exam seems to show asthma flared by seasonal allergies. Take the prednisone as directed. Start the [employer] daily. Also use OTC nasal saline washes for sinus dryness and irritation. Continue a daily allergy medicine as discussed. Follow-up with your provider as needed.

## 2015-10-26 NOTE — ED Notes (Signed)
Pts reports to ED w/ c/o cough/congestion x 1 week.  Pt sts that she has hx of asthma and been using inhaler more freq.  Reports coughing up yellow thick secretions.

## 2015-10-26 NOTE — ED Provider Notes (Signed)
[facility] Emergency Department Provider Note ____________________________________________  Time seen: 1544  I have reviewed the triage vital signs and the nursing notes.  HISTORY  Chief Complaint  sinus congestion  and chest congestion    HPI Jennifer Vasquez is a 30 y.o. female presents to the ED for evaluation of symptoms which she thinks are an early bronchitis. She reports about a week to week and a half of intermittent cough, watery eyes, regular, and sneezing. She's been dosing her regular asthma and bronchitis medications including Combivent and throat air. She's also been using over-the-counter nasal sprays like Afrin and Neo-Synephrine she denies any interim fever, chills, sweats. She also denies any significant shortness of breath or wheezing.  Past Medical History  Diagnosis Date  . Asthma   . Hepatitis C     Patient Active Problem List   Diagnosis Date Noted  . DDD (degenerative disc disease), lumbar 11/13/2014  . DDD (degenerative disc disease), cervical 11/13/2014  . Lumbosacral radiculopathy 11/13/2014    Past Surgical History  Procedure Laterality Date  . Tonsillectomy    . Tubal ligation    . Cervical disc arthroplasty      Current Outpatient Rx  Name  Route  Sig  Dispense  Refill  . albuterol-ipratropium (COMBIVENT) 18-103 MCG/ACT inhaler   Inhalation   Inhale 2 puffs into the lungs 3 (three) times daily.         . fluticasone (FLONASE) 50 MCG/ACT nasal spray   Each Nare   Place 2 sprays into both nostrils daily.   16 g   0   . ibuprofen (ADVIL,MOTRIN) 200 MG tablet   Oral   Take 800 mg by mouth every 6 (six) hours as needed.         . metroNIDAZOLE (FLAGYL) 500 MG tablet   Oral   Take 1 tablet (500 mg total) by mouth 2 (two) times daily after a meal.   14 tablet   0   . naproxen (NAPROSYN) 500 MG tablet   Oral   Take 1 tablet (500 mg total) by mouth 2 (two) times daily with a meal.   20 tablet   2   .  penicillin v potassium (VEETID) 500 MG tablet   Oral   Take 1 tablet (500 mg total) by mouth 4 (four) times daily.   40 tablet   0   . predniSONE (DELTASONE) 10 MG tablet   Oral   Take 1 tablet (10 mg total) by mouth 2 (two) times daily with a meal.   10 tablet   0   . traZODone (DESYREL) 50 MG tablet   Oral   Take 50 mg by mouth at bedtime as needed for sleep.          Allergies Acetaminophen; Amoxicillin-pot clavulanate; Gabapentin; Tramadol; and Shellfish allergy  Family History  Problem Relation Age of Onset  . Arthritis Mother   . Asthma Mother   . Diabetes Mother   . Hyperlipidemia Mother   . Vision loss Mother   . Alcohol abuse Father   . Asthma Father   . Diabetes Father   . Heart disease Father   . Depression Father   . COPD Father   . Cancer Father   . Hypertension Father   . Kidney disease Father   . Vision loss Father     Social History Social History  Substance Use Topics  . Smoking status: Current Some Day Smoker -- 5.00 packs/day  Types: Cigarettes  . Smokeless tobacco: Never Used  . Alcohol Use: No   Review of Systems  Constitutional: Negative for fever. Eyes: Negative for visual changes. Watery & itchy eyes as above. ENT: Negative for sore throat. Runny nose and sneezing as above. Cardiovascular: Negative for chest pain. Respiratory: Negative for shortness of breath. Reports intermittently productive cough. Gastrointestinal: Negative for abdominal pain, vomiting and diarrhea. Genitourinary: Negative for dysuria. Musculoskeletal: Negative for back pain. Skin: Negative for rash. Neurological: Negative for headaches, focal weakness or numbness. ____________________________________________  PHYSICAL EXAM:  VITAL SIGNS: ED Triage Vitals  Enc Vitals Group     BP 10/26/15 1501 130/69 mmHg     Pulse Rate 10/26/15 1501 89     Resp 10/26/15 1501 18     Temp 10/26/15 1501 98.3 F (36.8 C)     Temp Source 10/26/15 1501 Oral     SpO2  10/26/15 1501 97 %     Weight 10/26/15 1501 173 lb (78.472 kg)     Height 10/26/15 1501 5\' 3"  (1.6 m)     Head Cir --      Peak Flow --      Pain Score 10/26/15 1503 0     Pain Loc --      Pain Edu? --      Excl. in GC? --    Constitutional: Alert and oriented. Well appearing and in no distress. Head: Normocephalic and atraumatic.      Eyes: Conjunctivae are normal. PERRL. Normal extraocular movements      Ears: Canals clear. TMs intact bilaterally.   Nose: No congestion/rhinorrhea. Flat, dry, erythematous nasal mucosa.    Mouth/Throat: Mucous membranes are moist. Uvula midline and tonsils are absent.    Neck: Supple. No thyromegaly. Hematological/Lymphatic/Immunological: No cervical lymphadenopathy. Cardiovascular: Normal rate, regular rhythm.  Respiratory: Normal respiratory effort. No rales/rhonchi. Diffuse end-expiratory wheezing from the apices to bases bilaterally.  Musculoskeletal: Nontender with normal range of motion in all extremities.  Neurologic:  Normal gait without ataxia. Normal speech and language. No gross focal neurologic deficits are appreciated. Skin:  Skin is warm, dry and intact. No rash noted. Psychiatric: Mood and affect are normal. Patient exhibits appropriate insight and judgment. ___________________________________________  PROCEDURES  Prednisone 20 mg PO ____________________________________________  INITIAL IMPRESSION / ASSESSMENT AND PLAN / ED COURSE  Patient with an exam does not indicate any acute history distress. She likely swelling with an asthma flare secondary to seasonal allergy triggers including pollen. The patient is encouraged to continue to dose over-the-counter anti-histamines as necessary. She is also going to be encouraged to use nasal saline to wash her sinuses daily. She is advised to discontinue use of over-the-counter nasal decongestants like Neo-Synephrine and Afrin. She'll be provided with a prescription for Flonase and  prednisone pills to dose over the next 5 days. She will follow-up with primary care provider for ongoing symptom management. She should return to the ED for acutely worsening symptoms. ____________________________________________  FINAL CLINICAL IMPRESSION(S) / ED DIAGNOSES  Final diagnoses:  Bronchitis, allergic, mild intermittent, uncomplicated     10/28/15, PA-C 10/26/15 1611  10/28/15, MD 10/27/15 [phone]

## 2015-12-10 ENCOUNTER — Other Ambulatory Visit: Payer: Self-pay | Admitting: Neurosurgery

## 2015-12-10 DIAGNOSIS — M79605 Pain in left leg: Principal | ICD-10-CM

## 2015-12-10 DIAGNOSIS — G8929 Other chronic pain: Secondary | ICD-10-CM

## 2015-12-16 ENCOUNTER — Ambulatory Visit: Payer: Self-pay | Admitting: Gastroenterology

## 2015-12-22 ENCOUNTER — Other Ambulatory Visit: Payer: Self-pay

## 2015-12-30 ENCOUNTER — Ambulatory Visit
Admission: RE | Admit: 2015-12-30 | Discharge: 2015-12-30 | Disposition: A | Discharge status: Home / Self Care | Payer: BLUE CROSS/BLUE SHIELD | Source: Ambulatory Visit | Attending: Neurosurgery | Admitting: Neurosurgery

## 2015-12-30 DIAGNOSIS — G8929 Other chronic pain: Secondary | ICD-10-CM | POA: Insufficient documentation

## 2015-12-30 DIAGNOSIS — M47816 Spondylosis without myelopathy or radiculopathy, lumbar region: Secondary | ICD-10-CM | POA: Insufficient documentation

## 2015-12-30 DIAGNOSIS — M79605 Pain in left leg: Secondary | ICD-10-CM | POA: Diagnosis present

## 2016-01-19 ENCOUNTER — Ambulatory Visit: Payer: Self-pay | Admitting: Gastroenterology

## 2016-02-16 ENCOUNTER — Other Ambulatory Visit: Payer: Self-pay

## 2016-02-17 ENCOUNTER — Ambulatory Visit: Payer: Self-pay | Admitting: Gastroenterology

## 2016-04-23 ENCOUNTER — Emergency Department
Admission: EM | Admit: 2016-04-23 | Discharge: 2016-04-23 | Disposition: A | Discharge status: Home / Self Care | Payer: BLUE CROSS/BLUE SHIELD | Attending: Emergency Medicine | Admitting: Emergency Medicine

## 2016-04-23 ENCOUNTER — Encounter: Payer: Self-pay | Admitting: Emergency Medicine

## 2016-04-23 DIAGNOSIS — Z79899 Other long term (current) drug therapy: Secondary | ICD-10-CM | POA: Insufficient documentation

## 2016-04-23 DIAGNOSIS — F17210 Nicotine dependence, cigarettes, uncomplicated: Secondary | ICD-10-CM | POA: Insufficient documentation

## 2016-04-23 DIAGNOSIS — J45909 Unspecified asthma, uncomplicated: Secondary | ICD-10-CM | POA: Diagnosis not present

## 2016-04-23 DIAGNOSIS — R112 Nausea with vomiting, unspecified: Secondary | ICD-10-CM | POA: Diagnosis present

## 2016-04-23 DIAGNOSIS — Z791 Long term (current) use of non-steroidal anti-inflammatories (NSAID): Secondary | ICD-10-CM | POA: Diagnosis not present

## 2016-04-23 DIAGNOSIS — K529 Noninfective gastroenteritis and colitis, unspecified: Secondary | ICD-10-CM | POA: Insufficient documentation

## 2016-04-23 LAB — COMPREHENSIVE METABOLIC PANEL
ALBUMIN: 4.6 g/dL (ref 3.5–5.0)
ALT: 33 U/L (ref 14–54)
AST: 27 U/L (ref 15–41)
Alkaline Phosphatase: 48 U/L (ref 38–126)
Anion gap: 10 (ref 5–15)
BUN: 9 mg/dL (ref 6–20)
CHLORIDE: 104 mmol/L (ref 101–111)
CO2: 27 mmol/L (ref 22–32)
Calcium: 9.7 mg/dL (ref 8.9–10.3)
Creatinine, Ser: 0.81 mg/dL (ref 0.44–1.00)
GFR calc Af Amer: >60 mL/min (ref >60)
GFR calc non Af Amer: >60 mL/min (ref >60)
GLUCOSE: 97 mg/dL (ref 65–99)
POTASSIUM: 4 mmol/L (ref 3.5–5.1)
SODIUM: 141 mmol/L (ref 135–145)
Total Bilirubin: 0.4 mg/dL (ref 0.3–1.2)
Total Protein: 8.2 g/dL — ABNORMAL HIGH (ref 6.5–8.1)

## 2016-04-23 LAB — CBC WITH DIFFERENTIAL/PLATELET
BASOS ABS: 0 10*3/uL (ref 0–0.1)
BASOS PCT: 0 %
EOS PCT: 1 %
Eosinophils Absolute: 0.1 10*3/uL (ref 0–0.7)
HEMATOCRIT: 42.6 % (ref 35.0–47.0)
Hemoglobin: 15.3 g/dL (ref 12.0–16.0)
Lymphocytes Relative: 16 %
Lymphs Abs: 2.2 10*3/uL (ref 1.0–3.6)
MCH: 34.5 pg — ABNORMAL HIGH (ref 26.0–34.0)
MCHC: 36 g/dL (ref 32.0–36.0)
MCV: 95.8 fL (ref 80.0–100.0)
MONO ABS: 0.8 10*3/uL (ref 0.2–0.9)
Monocytes Relative: 6 %
NEUTROS ABS: 10.7 10*3/uL — AB (ref 1.4–6.5)
Neutrophils Relative %: 77 %
PLATELETS: 210 10*3/uL (ref 150–440)
RBC: 4.44 MIL/uL (ref 3.80–5.20)
RDW: 12.6 % (ref 11.5–14.5)
WBC: 13.7 10*3/uL — ABNORMAL HIGH (ref 3.6–11.0)

## 2016-04-23 LAB — LIPASE, BLOOD: Lipase: 22 U/L (ref 11–51)

## 2016-04-23 MED ORDER — IBUPROFEN 400 MG PO TABS
400.0000 mg | ORAL_TABLET | Freq: Once | ORAL | Status: AC
  Administered 2016-04-23: 400 mg via ORAL
  Filled 2016-04-23: qty 1

## 2016-04-23 MED ORDER — ONDANSETRON HCL 4 MG/2ML IJ SOLN
4.0000 mg | Freq: Once | INTRAMUSCULAR | Status: AC
  Administered 2016-04-23: 4 mg via INTRAVENOUS

## 2016-04-23 MED ORDER — SODIUM CHLORIDE 0.9 % IV SOLN
1000.0000 mL | Freq: Once | INTRAVENOUS | Status: AC
  Administered 2016-04-23: 1000 mL via INTRAVENOUS

## 2016-04-23 MED ORDER — ONDANSETRON HCL 4 MG PO TABS: 4.0000 mg | ORAL_TABLET | Freq: Every day | ORAL | 1 refills | Status: DC | PRN

## 2016-04-23 MED ORDER — LORAZEPAM 2 MG/ML IJ SOLN
INTRAMUSCULAR | Status: AC
  Filled 2016-04-23: qty 1

## 2016-04-23 NOTE — ED Notes (Signed)
Patient sleeping

## 2016-04-23 NOTE — ED Notes (Signed)
Patient states that she has had RUQ, LUQ abdominal pain, N/V since about 2 am. Patient states that when she got here she had 1 episode of diarrhea. Patient states that she has not been able to keep fluids down.

## 2016-04-23 NOTE — ED Triage Notes (Signed)
Pt presents with n/v/d started this am.

## 2016-04-23 NOTE — ED Provider Notes (Signed)
[facility] Emergency Department Provider Note   ____________________________________________    I have reviewed the triage vital signs and the nursing notes.   HISTORY  Chief Complaint Emesis; Nausea; and Diarrhea     HPI Jennifer Vasquez is a 30 y.o. female who presents with complaints of nausea vomiting and diarrhea which started approximately 2 AM this morning. She reports frequent episodes of nonbilious nonbloody vomiting and then developed diarrhea approximately 1-2 hours ago. She denies sick contacts. No recent travel. No unusual food choices. Denies fevers or chills. Intermittent Mild diffuse abdominal cramping but no abdominal pain currently.   Past Medical History:  Diagnosis Date  . Acute carpal tunnel syndrome 10/10/2014  . Anxiety state 09/03/2009  . Asthma   . Bipolar disorder (HCC) 02/09/2013  . DDD (degenerative disc disease), cervical 11/13/2014  . DDD (degenerative disc disease), lumbar 11/13/2014  . Hepatitis C   . Lumbosacral radiculopathy 11/13/2014  . Tobacco use disorder 09/06/2012    Patient Active Problem List   Diagnosis Date Noted  . DDD (degenerative disc disease), lumbar 11/13/2014  . DDD (degenerative disc disease), cervical 11/13/2014  . Lumbosacral radiculopathy 11/13/2014  . Acute carpal tunnel syndrome 10/10/2014  . Chronic pain 03/02/2013  . Depression 03/02/2013  . Bipolar disorder (HCC) 02/09/2013  . Hepatitis C 12/18/2012  . Asthma 11/23/2012  . Neck pain 11/23/2012  . Torticollis 11/23/2012  . Tobacco use disorder 09/06/2012  . Anxiety state 09/03/2009    Past Surgical History:  Procedure Laterality Date  . CERVICAL DISC ARTHROPLASTY    . TONSILLECTOMY    . TUBAL LIGATION      Prior to Admission medications   Medication Sig Start Date End Date Taking? Authorizing Provider  albuterol (PROVENTIL HFA;VENTOLIN HFA) 108 (90 Base) MCG/ACT inhaler Inhale 1 puff into the lungs every 6 (six) hours as needed for  wheezing or shortness of breath.   Yes Historical Provider, MD  albuterol-ipratropium (COMBIVENT) 18-103 MCG/ACT inhaler Inhale 2 puffs into the lungs 3 (three) times daily.   Yes Historical Provider, MD  Fluticasone-Salmeterol (ADVAIR DISKUS) 250-50 MCG/DOSE AEPB 1 inhalation 2 (two) times daily. 04/01/14  Yes Historical Provider, MD  HYDROcodone-acetaminophen (NORCO) 10-325 MG tablet Take 1 tablet by mouth every 6 (six) hours as needed.   Yes Historical Provider, MD  ibuprofen (ADVIL,MOTRIN) 200 MG tablet Take 800 mg by mouth every 6 (six) hours as needed.   Yes Historical Provider, MD  levothyroxine (SYNTHROID, LEVOTHROID) 25 MCG tablet Take 25 mcg by mouth daily before breakfast.   Yes Historical Provider, MD  naproxen (NAPROSYN) 500 MG tablet Take 1 tablet (500 mg total) by mouth 2 (two) times daily with a meal. 12/17/14  Yes Jene Every, MD  traZODone (DESYREL) 50 MG tablet Take 50 mg by mouth at bedtime as needed for sleep.   Yes Historical Provider, MD  ondansetron (ZOFRAN) 4 MG tablet Take 1 tablet (4 mg total) by mouth daily as needed for nausea or vomiting. 04/23/16   Jene Every, MD     Allergies Acetaminophen; Amoxicillin-pot clavulanate; Gabapentin; Tramadol; and Shellfish allergy  Family History  Problem Relation Age of Onset  . Arthritis Mother   . Asthma Mother   . Diabetes Mother   . Hyperlipidemia Mother   . Vision loss Mother   . Alcohol abuse Father   . Asthma Father   . Diabetes Father   . Heart disease Father   . Depression Father   . COPD Father   .  Cancer Father   . Hypertension Father   . Kidney disease Father   . Vision loss Father     Social History Social History  Substance Use Topics  . Smoking status: Current Some Day Smoker    Packs/day: 5.00    Types: Cigarettes  . Smokeless tobacco: Never Used  . Alcohol use No    Review of Systems  Constitutional: No fever/chills   Cardiovascular: Denies chest pain. Respiratory: Denies  Cough Gastrointestinal: As above  Genitourinary: Negative for dysuria. Musculoskeletal: Negative for joint pain Skin: Negative for rash. Neurological: Negative for headaches   10-point ROS otherwise negative.  ____________________________________________   PHYSICAL EXAM:  VITAL SIGNS: ED Triage Vitals [04/23/16 0834]  Enc Vitals Group     BP 130/87     Pulse Rate 87     Resp 18     Temp 98.1 F (36.7 C)     Temp Source Oral     SpO2 96 %     Weight 188 lb 1.6 oz (85.3 kg)     Height 5\' 3"  (1.6 m)     Head Circumference      Peak Flow      Pain Score 10     Pain Loc      Pain Edu?      Excl. in GC?     Constitutional: Alert and oriented. No acute distress.  Eyes: Conjunctivae are normal.   Nose: No congestion/rhinnorhea. Mouth/Throat: Mucous membranes are moist.    Cardiovascular: Normal rate, regular rhythm. Grossly normal heart sounds.  Good peripheral circulation. Respiratory: Normal respiratory effort.  No retractions. Lungs CTAB. Gastrointestinal: Soft and nontender. No distention.  No CVA tenderness. Genitourinary: deferred Musculoskeletal: No lower extremity tenderness nor edema.  Warm and well perfused Neurologic:  Normal speech and language. No gross focal neurologic deficits are appreciated.  Skin:  Skin is warm, dry and intact. No rash noted. Psychiatric: Mood and affect are normal. Speech and behavior are normal.  ____________________________________________   LABS (all labs ordered are listed, but only abnormal results are displayed)  Labs Reviewed  CBC WITH DIFFERENTIAL/PLATELET - Abnormal; Notable for the following:       Result Value   WBC 13.7 (*)    MCH 34.5 (*)    Neutro Abs 10.7 (*)    All other components within normal limits  COMPREHENSIVE METABOLIC PANEL - Abnormal; Notable for the following:    Total Protein 8.2 (*)    All other components within normal limits  LIPASE, BLOOD    ____________________________________________  EKG  None ____________________________________________  RADIOLOGY  None ____________________________________________   PROCEDURES  Procedure(s) performed: No    Critical Care performed: No ____________________________________________   INITIAL IMPRESSION / ASSESSMENT AND PLAN / ED COURSE  Pertinent labs & imaging results that were available during my care of the patient were reviewed by me and considered in my medical decision making (see chart for details).  Patient overall well-appearing and in no distress. Her vital signs are unremarkable. We'll check labs, give IV fluids, Zofran and reevaluate. She has had a tubal ligation. I suspect viral gastroenteritis which is rampant in the community at this time  Clinical Course  Patient had significant improvement after fluids and Zofran. Lab work is overall unremarkable, mild elevation in white blood cell count nonspecific and likely related to viral gastroenteritis. ____________________________________________   FINAL CLINICAL IMPRESSION(S) / ED DIAGNOSES  Final diagnoses:  Gastroenteritis      NEW MEDICATIONS STARTED DURING THIS VISIT:  Discharge Medication List as of 04/23/2016 10:15 AM    START taking these medications   Details  ondansetron (ZOFRAN) 4 MG tablet Take 1 tablet (4 mg total) by mouth daily as needed for nausea or vomiting., Starting Fri 04/23/2016, Print         Note:  This document was prepared using Dragon voice recognition software and may include unintentional dictation errors.    Jene Every, MD 04/23/16 1332

## 2016-05-09 ENCOUNTER — Encounter: Payer: Self-pay | Admitting: Emergency Medicine

## 2016-05-09 ENCOUNTER — Emergency Department
Admission: EM | Admit: 2016-05-09 | Discharge: 2016-05-09 | Disposition: A | Discharge status: Home / Self Care | Payer: BLUE CROSS/BLUE SHIELD | Attending: Emergency Medicine | Admitting: Emergency Medicine

## 2016-05-09 DIAGNOSIS — J45909 Unspecified asthma, uncomplicated: Secondary | ICD-10-CM | POA: Diagnosis not present

## 2016-05-09 DIAGNOSIS — T192XXA Foreign body in vulva and vagina, initial encounter: Secondary | ICD-10-CM | POA: Insufficient documentation

## 2016-05-09 DIAGNOSIS — F17210 Nicotine dependence, cigarettes, uncomplicated: Secondary | ICD-10-CM | POA: Insufficient documentation

## 2016-05-09 DIAGNOSIS — Y939 Activity, unspecified: Secondary | ICD-10-CM | POA: Diagnosis not present

## 2016-05-09 DIAGNOSIS — X58XXXA Exposure to other specified factors, initial encounter: Secondary | ICD-10-CM | POA: Insufficient documentation

## 2016-05-09 DIAGNOSIS — Y929 Unspecified place or not applicable: Secondary | ICD-10-CM | POA: Diagnosis not present

## 2016-05-09 DIAGNOSIS — Y999 Unspecified external cause status: Secondary | ICD-10-CM | POA: Diagnosis not present

## 2016-05-09 DIAGNOSIS — Z5321 Procedure and treatment not carried out due to patient leaving prior to being seen by health care provider: Secondary | ICD-10-CM | POA: Insufficient documentation

## 2016-05-09 DIAGNOSIS — Z79899 Other long term (current) drug therapy: Secondary | ICD-10-CM | POA: Diagnosis not present

## 2016-05-09 DIAGNOSIS — Z791 Long term (current) use of non-steroidal anti-inflammatories (NSAID): Secondary | ICD-10-CM | POA: Insufficient documentation

## 2016-05-09 NOTE — ED Triage Notes (Signed)
Patient states that she put a tampon in 2 days ago and has been unable to remove it.

## 2016-06-09 IMAGING — CR SACRUM AND COCCYX - 2+ VIEW
1 series · 3 of 3 positions shown · non-contrast
Comparison: 12/27/2013

CLINICAL DATA: Sacrum and coccyx pain after falling out of bed this
morning.

EXAM:
SACRUM AND COCCYX - 2+ VIEW

[Series 1: dxr sacrum and coccyx · 0.14mm/px · 3 of 3 slices shown]
[im 1/3]
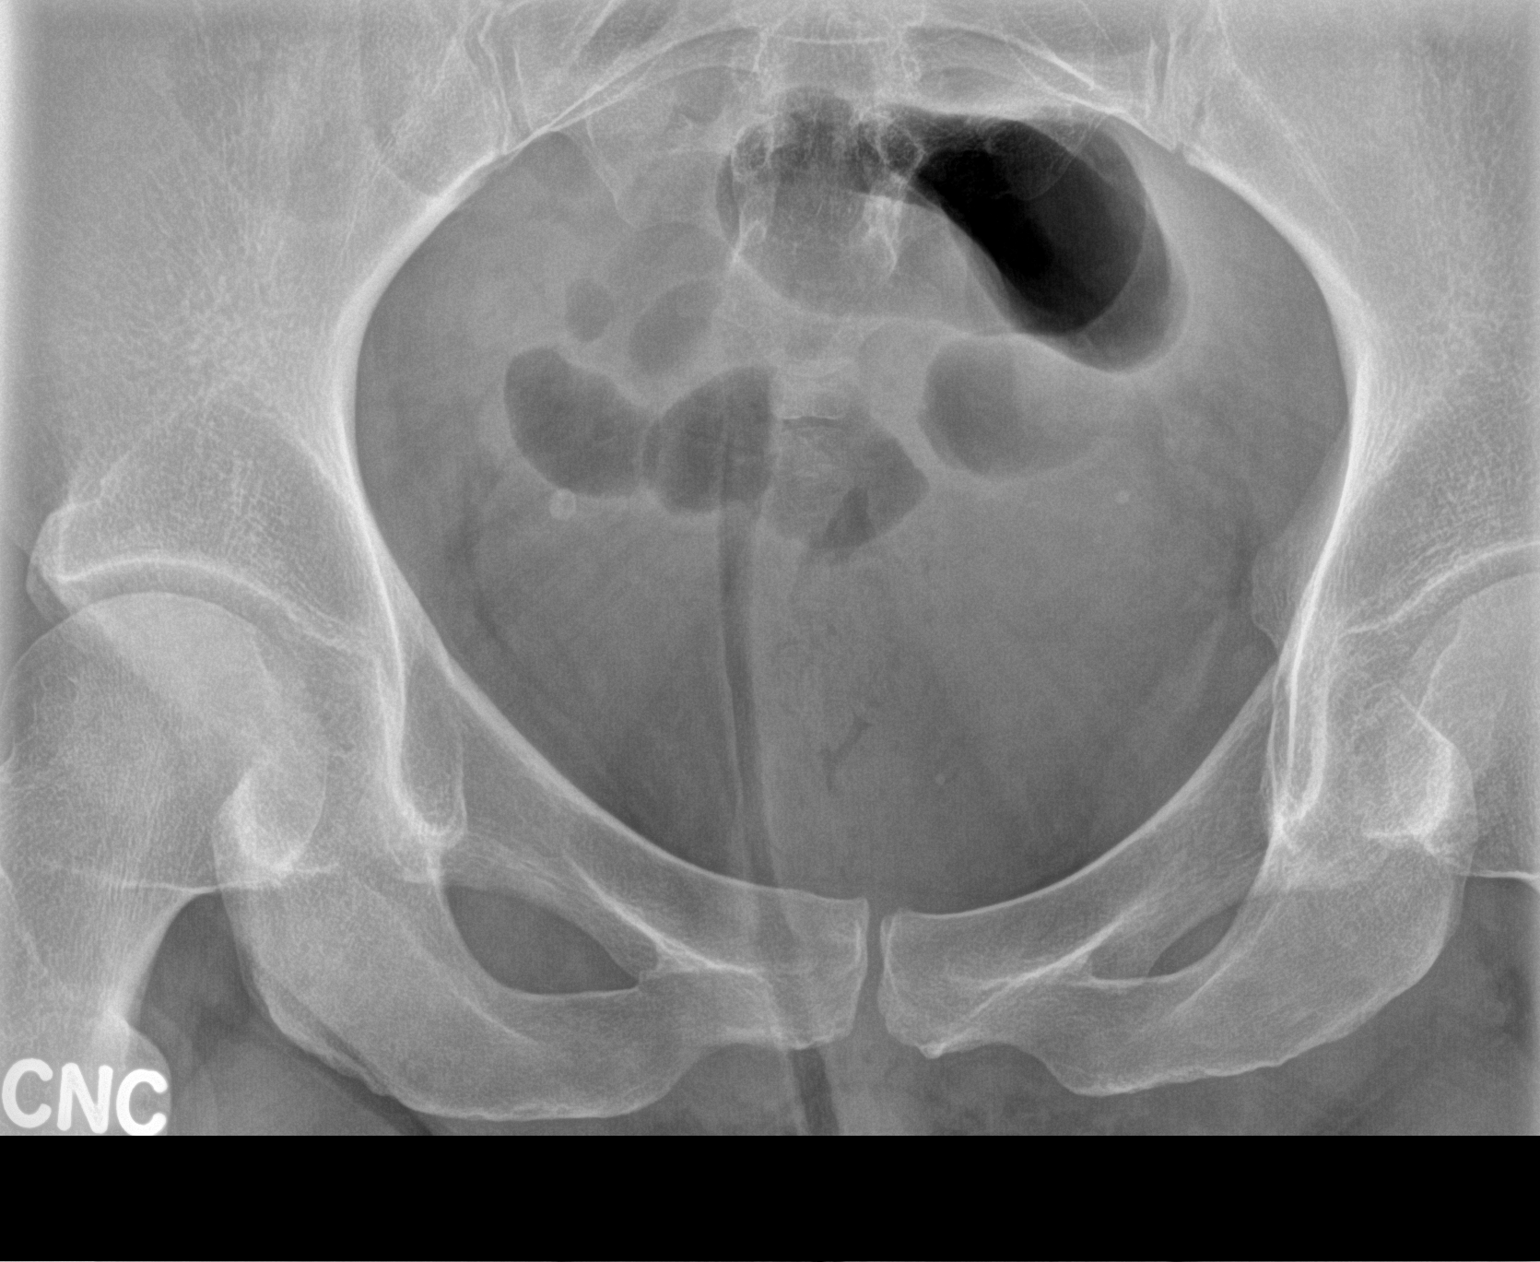
[im 2/3]
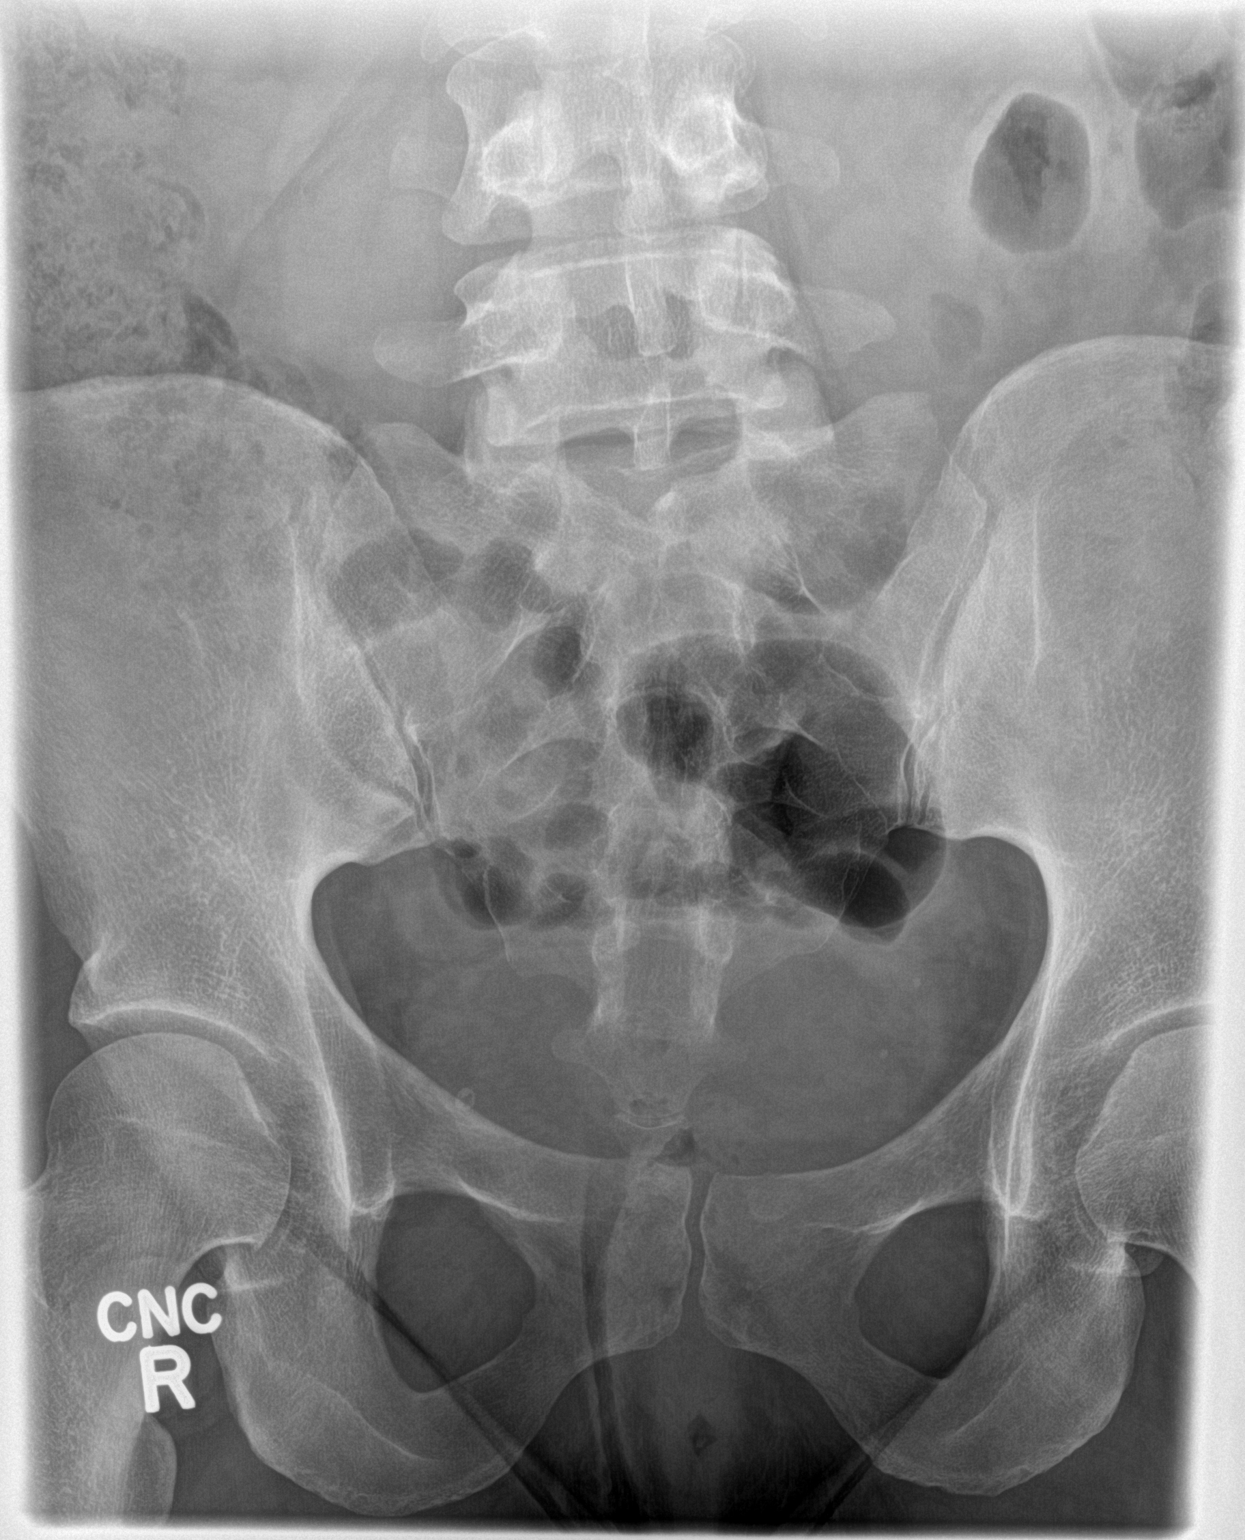
[im 3/3]
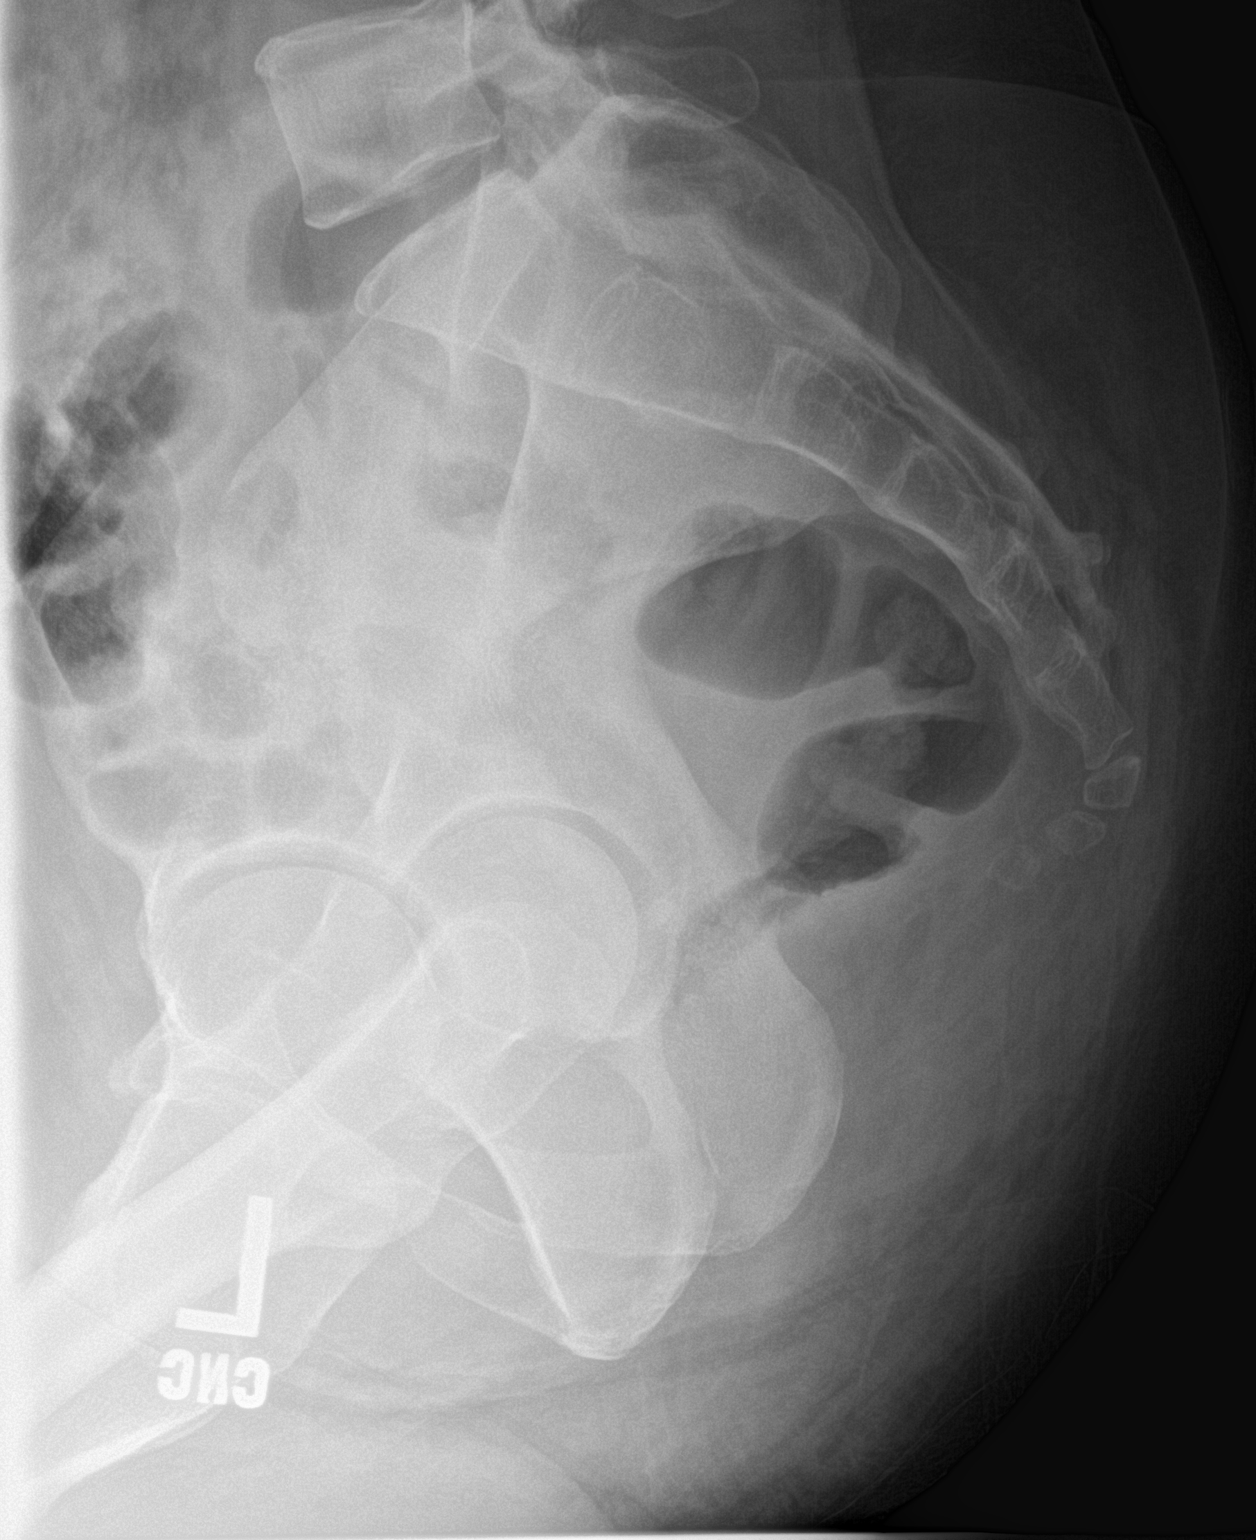

[3 of 3 positions shown; findings below may reference images not displayed]

FINDINGS: There is no evidence of fracture or other focal bone lesions.
IMPRESSION: Normal exam.

## 2016-06-22 ENCOUNTER — Other Ambulatory Visit: Payer: Self-pay

## 2016-06-22 ENCOUNTER — Encounter: Payer: Self-pay | Admitting: Gastroenterology

## 2016-06-22 ENCOUNTER — Ambulatory Visit (INDEPENDENT_AMBULATORY_CARE_PROVIDER_SITE_OTHER): Payer: BLUE CROSS/BLUE SHIELD | Admitting: Gastroenterology

## 2016-06-22 ENCOUNTER — Other Ambulatory Visit: Payer: Self-pay | Admitting: Gastroenterology

## 2016-06-22 VITALS — BP 121/69 | HR 93 | Temp 98.9°F | Ht 63.0 in | Wt 193.0 lb

## 2016-06-22 DIAGNOSIS — B182 Chronic viral hepatitis C: Secondary | ICD-10-CM | POA: Diagnosis not present
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
## 2016-06-29 LAB — HCV RT-PCR, QUANT (NON-GRAPH)
HCV QUANT LOG: 5.892 {Log_copies}/mL
HCV QUANTITATIVE BASELINE: 312000 [IU]/mL
HCV Quantitative: 780000 Copies/mL
IU LOG10: 5.494 {Log_IU}/mL

## 2016-07-06 ENCOUNTER — Encounter: Payer: Self-pay | Admitting: Emergency Medicine

## 2016-07-06 ENCOUNTER — Emergency Department: Payer: BLUE CROSS/BLUE SHIELD

## 2016-07-06 ENCOUNTER — Emergency Department
Admission: EM | Admit: 2016-07-06 | Discharge: 2016-07-06 | Disposition: A | Discharge status: Home / Self Care | Payer: BLUE CROSS/BLUE SHIELD | Attending: Emergency Medicine | Admitting: Emergency Medicine

## 2016-07-06 DIAGNOSIS — F32A Depression, unspecified: Secondary | ICD-10-CM

## 2016-07-06 DIAGNOSIS — Y92410 Unspecified street and highway as the place of occurrence of the external cause: Secondary | ICD-10-CM | POA: Insufficient documentation

## 2016-07-06 DIAGNOSIS — S59912A Unspecified injury of left forearm, initial encounter: Secondary | ICD-10-CM | POA: Diagnosis present

## 2016-07-06 DIAGNOSIS — Z79899 Other long term (current) drug therapy: Secondary | ICD-10-CM | POA: Insufficient documentation

## 2016-07-06 DIAGNOSIS — S5012XA Contusion of left forearm, initial encounter: Secondary | ICD-10-CM | POA: Insufficient documentation

## 2016-07-06 DIAGNOSIS — F329 Major depressive disorder, single episode, unspecified: Secondary | ICD-10-CM

## 2016-07-06 DIAGNOSIS — F17210 Nicotine dependence, cigarettes, uncomplicated: Secondary | ICD-10-CM | POA: Insufficient documentation

## 2016-07-06 DIAGNOSIS — F418 Other specified anxiety disorders: Secondary | ICD-10-CM | POA: Diagnosis not present

## 2016-07-06 DIAGNOSIS — Z791 Long term (current) use of non-steroidal anti-inflammatories (NSAID): Secondary | ICD-10-CM | POA: Diagnosis not present

## 2016-07-06 DIAGNOSIS — F10920 Alcohol use, unspecified with intoxication, uncomplicated: Secondary | ICD-10-CM

## 2016-07-06 DIAGNOSIS — F10120 Alcohol abuse with intoxication, uncomplicated: Secondary | ICD-10-CM | POA: Insufficient documentation

## 2016-07-06 DIAGNOSIS — Y999 Unspecified external cause status: Secondary | ICD-10-CM | POA: Insufficient documentation

## 2016-07-06 DIAGNOSIS — Y939 Activity, unspecified: Secondary | ICD-10-CM | POA: Diagnosis not present

## 2016-07-06 DIAGNOSIS — F419 Anxiety disorder, unspecified: Secondary | ICD-10-CM

## 2016-07-06 DIAGNOSIS — J45909 Unspecified asthma, uncomplicated: Secondary | ICD-10-CM | POA: Insufficient documentation

## 2016-07-06 LAB — COMPREHENSIVE METABOLIC PANEL
ALT: 72 U/L — AB (ref 14–54)
AST: 58 U/L — AB (ref 15–41)
Albumin: 4.2 g/dL (ref 3.5–5.0)
Alkaline Phosphatase: 58 U/L (ref 38–126)
Anion gap: 8 (ref 5–15)
BILIRUBIN TOTAL: 0.3 mg/dL (ref 0.3–1.2)
BUN: 9 mg/dL (ref 6–20)
CALCIUM: 8.6 mg/dL — AB (ref 8.9–10.3)
CO2: 22 mmol/L (ref 22–32)
CREATININE: 0.7 mg/dL (ref 0.44–1.00)
Chloride: 112 mmol/L — ABNORMAL HIGH (ref 101–111)
GFR calc Af Amer: >60 mL/min (ref >60)
Glucose, Bld: 101 mg/dL — ABNORMAL HIGH (ref 65–99)
Potassium: 3.9 mmol/L (ref 3.5–5.1)
Sodium: 142 mmol/L (ref 135–145)
TOTAL PROTEIN: 7.4 g/dL (ref 6.5–8.1)

## 2016-07-06 LAB — CBC WITH DIFFERENTIAL/PLATELET
BASOS ABS: 0 10*3/uL (ref 0–0.1)
Basophils Relative: 0 %
Eosinophils Absolute: 0.1 10*3/uL (ref 0–0.7)
Eosinophils Relative: 1 %
HEMATOCRIT: 39.9 % (ref 35.0–47.0)
Hemoglobin: 14.1 g/dL (ref 12.0–16.0)
LYMPHS PCT: 27 %
Lymphs Abs: 3.2 10*3/uL (ref 1.0–3.6)
MCH: 33.6 pg (ref 26.0–34.0)
MCHC: 35.3 g/dL (ref 32.0–36.0)
MCV: 95.2 fL (ref 80.0–100.0)
MONO ABS: 0.6 10*3/uL (ref 0.2–0.9)
Monocytes Relative: 6 %
NEUTROS ABS: 7.7 10*3/uL — AB (ref 1.4–6.5)
Neutrophils Relative %: 66 %
Platelets: 252 10*3/uL (ref 150–440)
RBC: 4.2 MIL/uL (ref 3.80–5.20)
RDW: 12.4 % (ref 11.5–14.5)
WBC: 11.7 10*3/uL — AB (ref 3.6–11.0)

## 2016-07-06 LAB — ETHANOL: Alcohol, Ethyl (B): 206 mg/dL — ABNORMAL HIGH (ref <5)

## 2016-07-06 LAB — SALICYLATE LEVEL: Salicylate Lvl: <7 mg/dL (ref 2.8–30.0)

## 2016-07-06 LAB — ACETAMINOPHEN LEVEL

## 2016-07-06 MED ORDER — LEVOTHYROXINE SODIUM 25 MCG PO TABS
25.0000 ug | ORAL_TABLET | Freq: Every day | ORAL | Status: DC
  Administered 2016-07-06: 25 ug via ORAL
  Filled 2016-07-06: qty 2

## 2016-07-06 MED ORDER — SODIUM CHLORIDE 0.9 % IV BOLUS (SEPSIS)
1000.0000 mL | Freq: Once | INTRAVENOUS | Status: AC
  Administered 2016-07-06: 1000 mL via INTRAVENOUS

## 2016-07-06 MED ORDER — IPRATROPIUM-ALBUTEROL 18-103 MCG/ACT IN AERO
2.0000 | INHALATION_SPRAY | Freq: Three times a day (TID) | RESPIRATORY_TRACT | Status: DC
  Administered 2016-07-06: 2 via RESPIRATORY_TRACT
  Filled 2016-07-06: qty 14.7

## 2016-07-06 MED ORDER — IBUPROFEN 800 MG PO TABS
ORAL_TABLET | ORAL | Status: AC
  Administered 2016-07-06: 800 mg via ORAL
  Filled 2016-07-06: qty 1

## 2016-07-06 MED ORDER — BUPROPION HCL ER (SR) 100 MG PO TB12
100.0000 mg | ORAL_TABLET | Freq: Two times a day (BID) | ORAL | Status: DC
  Administered 2016-07-06: 100 mg via ORAL
  Filled 2016-07-06 (×2): qty 1

## 2016-07-06 MED ORDER — HYDROCODONE-ACETAMINOPHEN 5-325 MG PO TABS
1.0000 | ORAL_TABLET | Freq: Two times a day (BID) | ORAL | Status: DC | PRN
  Administered 2016-07-06: 1 via ORAL
  Filled 2016-07-06: qty 1

## 2016-07-06 MED ORDER — FLUTICASONE PROPIONATE 50 MCG/ACT NA SUSP
1.0000 | Freq: Every day | NASAL | Status: DC
  Administered 2016-07-06: 1 via NASAL
  Filled 2016-07-06: qty 16

## 2016-07-06 MED ORDER — ZIPRASIDONE MESYLATE 20 MG IM SOLR
INTRAMUSCULAR | Status: AC
  Administered 2016-07-06: 10 mg via INTRAMUSCULAR
  Filled 2016-07-06: qty 20

## 2016-07-06 MED ORDER — ALBUTEROL SULFATE HFA 108 (90 BASE) MCG/ACT IN AERS
1.0000 | INHALATION_SPRAY | Freq: Four times a day (QID) | RESPIRATORY_TRACT | Status: DC | PRN
  Administered 2016-07-06: 1 via RESPIRATORY_TRACT
  Filled 2016-07-06 (×2): qty 6.7

## 2016-07-06 MED ORDER — LORAZEPAM 2 MG/ML IJ SOLN
1.0000 mg | Freq: Once | INTRAMUSCULAR | Status: DC
  Filled 2016-07-06: qty 1

## 2016-07-06 MED ORDER — ZIPRASIDONE MESYLATE 20 MG IM SOLR
10.0000 mg | Freq: Once | INTRAMUSCULAR | Status: AC
  Administered 2016-07-06: 10 mg via INTRAMUSCULAR

## 2016-07-06 MED ORDER — IBUPROFEN 800 MG PO TABS
800.0000 mg | ORAL_TABLET | Freq: Once | ORAL | Status: AC
  Administered 2016-07-06: 800 mg via ORAL

## 2016-07-06 MED ORDER — FLUTICASONE FUROATE-VILANTEROL 200-25 MCG/INH IN AEPB
1.0000 | INHALATION_SPRAY | Freq: Every day | RESPIRATORY_TRACT | Status: DC
  Filled 2016-07-06: qty 28

## 2016-07-06 NOTE — ED Notes (Signed)
Dressed pt out in burgundy scrubs without incident.  Black hair tie and 3 rings.  1st ring is silver in color with 2 clear stones.  Second ring is silver colored with one round clear stone.  3rd ring is silver colored with light blue stone.  All 3 rings placed in cup, labelled, and put with her belongings.
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
## 2016-07-06 NOTE — ED Notes (Signed)
Pt legs released from shackles by ODS

## 2016-07-06 NOTE — ED Notes (Signed)
Pt given water upon request. Resting comfortably
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
## 2016-07-06 NOTE — Discharge Instructions (Signed)
Return to the emergency department for any worsening condition including worsening depression, any thoughts of wanting to hurt herself or others, or any other symptoms concerning to you.
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
## 2016-07-06 NOTE — ED Provider Notes (Signed)
Patient asked for Norco, she has a history of chronic back pain for which she apparently does take chronic narcotics. Pharmacy tech stated she spoke with the pharmacist regarding Norco 10/325 which was last prescribed 100 tablets in August. Patient states that she often will take 1 per day although is prescribed as needed every 6 hours.  I did place an order for when necessary twice daily while here in the emergency department, in accordance with her home medication.   Governor Rooks, MD 07/06/16 [phone]
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
## 2016-07-06 NOTE — ED Notes (Signed)
SOC    CALLED 

## 2016-07-06 NOTE — ED Notes (Signed)
Pt requesting inhaler, resp assessed, Dr Dolores Frame notified

## 2016-07-06 NOTE — ED Notes (Signed)
Pt escorted to br with security and charge nurse

## 2016-07-06 NOTE — ED Notes (Signed)
Per pharmacy instructions she was given her used inhalers and non used one returned

## 2016-07-06 NOTE — ED Notes (Addendum)
informed by staff that pt removed IV, bleeding has abated, remainder of NS thrown away, Beth, NT present, site appears clean and dry
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
## 2016-07-06 NOTE — Progress Notes (Signed)
TTS  Was unable to speak with the patient as she became highly emotionally volatile. Patient was crying, kicking, screaming, and became unmanageable.  Patient was sedated at this time.

## 2016-07-06 NOTE — ED Notes (Signed)
Pt boyfriend and bf's mother escorted back to pt in order to facilitate to cooperation with treatment
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
## 2016-07-07 ENCOUNTER — Telehealth: Payer: Self-pay

## 2016-07-07 NOTE — Telephone Encounter (Signed)
-----   Message from Midge Minium, MD sent at 07/06/2016  7:53 AM EST ----- Let the patient know the virus was positive and she needs the full workup prior to treatment.

## 2016-07-07 NOTE — Telephone Encounter (Signed)
Tried contacting pt but cell number was not working. Tried contacting pt on home number but she wasn't there per father. Will try again later.

## 2016-09-02 ENCOUNTER — Telehealth: Payer: Self-pay

## 2016-09-02 NOTE — Telephone Encounter (Signed)
Tried again to contact pt but phone was still not working. Mailed results.

## 2016-09-02 NOTE — Telephone Encounter (Signed)
-----   Message from Midge Minium, MD sent at 07/06/2016  7:53 AM EST ----- Let the patient know the virus was positive and she needs the full workup prior to treatment.

## 2016-09-02 NOTE — Telephone Encounter (Signed)
Multiple attempts made to contact pt. Mailed letter with results and requested a return call.

## 2016-09-08 ENCOUNTER — Telehealth: Payer: Self-pay

## 2016-09-08 ENCOUNTER — Other Ambulatory Visit: Payer: Self-pay

## 2016-09-08 DIAGNOSIS — B182 Chronic viral hepatitis C: Secondary | ICD-10-CM

## 2016-09-08 NOTE — Telephone Encounter (Signed)
Pt returned call and has been scheduled for a RUQ abdominal US/Tissue elastography at [facility] on Wednesday, March 7th @ 8:00am. Pt has been notified and instructed [facility]45 and to be NPO after midnight. She has also been directed to get additional labs done.

## 2016-09-15 ENCOUNTER — Ambulatory Visit: Admission: RE | Admit: 2016-09-15 | Payer: BLUE CROSS/BLUE SHIELD | Source: Ambulatory Visit

## 2016-09-20 ENCOUNTER — Ambulatory Visit: Payer: BLUE CROSS/BLUE SHIELD

## 2016-09-20 ENCOUNTER — Ambulatory Visit
Admission: RE | Admit: 2016-09-20 | Discharge: 2016-09-20 | Disposition: A | Discharge status: Home / Self Care | Payer: BLUE CROSS/BLUE SHIELD | Source: Ambulatory Visit | Attending: Gastroenterology | Admitting: Gastroenterology

## 2016-09-20 ENCOUNTER — Other Ambulatory Visit
Admission: RE | Admit: 2016-09-20 | Discharge: 2016-09-20 | Disposition: A | Discharge status: Home / Self Care | Payer: BLUE CROSS/BLUE SHIELD | Source: Ambulatory Visit | Attending: Gastroenterology | Admitting: Gastroenterology

## 2016-09-20 DIAGNOSIS — B182 Chronic viral hepatitis C: Secondary | ICD-10-CM | POA: Diagnosis not present

## 2016-09-20 LAB — HEPATIC FUNCTION PANEL
ALBUMIN: 4.1 g/dL (ref 3.5–5.0)
ALK PHOS: 51 U/L (ref 38–126)
ALT: 37 U/L (ref 14–54)
AST: 31 U/L (ref 15–41)
Bilirubin, Direct: <0.1 mg/dL — ABNORMAL LOW (ref 0.1–0.5)
TOTAL PROTEIN: 7.3 g/dL (ref 6.5–8.1)
Total Bilirubin: 0.5 mg/dL (ref 0.3–1.2)

## 2016-09-20 LAB — PROTIME-INR
INR: 0.97
Prothrombin Time: 12.9 seconds (ref 11.4–15.2)

## 2016-09-21 ENCOUNTER — Ambulatory Visit
Admission: RE | Admit: 2016-09-21 | Discharge: 2016-09-21 | Disposition: A | Discharge status: Home / Self Care | Payer: BLUE CROSS/BLUE SHIELD | Source: Ambulatory Visit | Attending: Gastroenterology | Admitting: Gastroenterology

## 2016-09-21 DIAGNOSIS — B182 Chronic viral hepatitis C: Secondary | ICD-10-CM | POA: Insufficient documentation

## 2016-09-24 ENCOUNTER — Telehealth: Payer: Self-pay

## 2016-09-24 NOTE — Telephone Encounter (Signed)
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

## 2016-09-24 NOTE — Telephone Encounter (Signed)
-----   Message from Midge Minium, MD sent at 09/20/2016 10:42 AM EDT ----- Let the patient know that her LFT's are back to normal.

## 2016-09-24 NOTE — Telephone Encounter (Signed)
Tried contacting pt but vm box was full. Not able to leave message.

## 2016-09-27 ENCOUNTER — Other Ambulatory Visit: Payer: BLUE CROSS/BLUE SHIELD

## 2016-09-28 ENCOUNTER — Telehealth: Payer: Self-pay | Admitting: Gastroenterology

## 2016-09-28 ENCOUNTER — Other Ambulatory Visit: Payer: Self-pay

## 2016-09-28 DIAGNOSIS — B182 Chronic viral hepatitis C: Secondary | ICD-10-CM

## 2016-09-28 NOTE — Telephone Encounter (Signed)
Tried contacting pt again regarding labs. VM box was still full. Not able to leave message.

## 2016-09-28 NOTE — Telephone Encounter (Signed)
Patient called wanting her results. She left a message on our voicemail. Thanks!

## 2016-09-28 NOTE — Telephone Encounter (Signed)
[phone] Results

## 2016-09-28 NOTE — Telephone Encounter (Signed)
Patient called and is ready to start treatment.

## 2016-09-29 ENCOUNTER — Other Ambulatory Visit: Payer: Self-pay

## 2016-09-29 DIAGNOSIS — B182 Chronic viral hepatitis C: Secondary | ICD-10-CM

## 2016-09-29 NOTE — Telephone Encounter (Signed)
Pt returned call and was notified of [place] and lab results. Advised pt [facility] lab only drew 2 labs that were ordered. Pt will return to the lab this week to have the additional labs done.

## 2016-09-29 NOTE — Telephone Encounter (Signed)
LVM for pt to return my call.

## 2016-09-30 ENCOUNTER — Other Ambulatory Visit
Admission: RE | Admit: 2016-09-30 | Discharge: 2016-09-30 | Disposition: A | Discharge status: Home / Self Care | Payer: BLUE CROSS/BLUE SHIELD | Source: Ambulatory Visit | Attending: Gastroenterology | Admitting: Gastroenterology

## 2016-09-30 DIAGNOSIS — B182 Chronic viral hepatitis C: Secondary | ICD-10-CM | POA: Diagnosis not present

## 2016-09-30 LAB — CBC WITH DIFFERENTIAL/PLATELET
Basophils Absolute: 0.1 K/uL (ref 0–0.1)
Basophils Relative: 1 %
Eosinophils Absolute: 0.2 K/uL (ref 0–0.7)
Eosinophils Relative: 1 %
HCT: 38.1 % (ref 35.0–47.0)
Hemoglobin: 13.7 g/dL (ref 12.0–16.0)
Lymphocytes Relative: 28 %
Lymphs Abs: 3.4 K/uL (ref 1.0–3.6)
MCH: 33.9 pg (ref 26.0–34.0)
MCHC: 35.8 g/dL (ref 32.0–36.0)
MCV: 94.5 fL (ref 80.0–100.0)
Monocytes Absolute: 1 K/uL — ABNORMAL HIGH (ref 0.2–0.9)
Monocytes Relative: 8 %
Neutro Abs: 7.5 K/uL — ABNORMAL HIGH (ref 1.4–6.5)
Neutrophils Relative %: 62 %
Platelets: 207 K/uL (ref 150–440)
RBC: 4.03 MIL/uL (ref 3.80–5.20)
RDW: 12.5 % (ref 11.5–14.5)
WBC: 12.1 K/uL — ABNORMAL HIGH (ref 3.6–11.0)

## 2016-10-01 LAB — CERULOPLASMIN: Ceruloplasmin: 30.5 mg/dL (ref 19.0–39.0)

## 2016-10-01 LAB — HEPATITIS A ANTIBODY, TOTAL: Hep A Total Ab: NEGATIVE

## 2016-10-01 LAB — HEPATITIS C ANTIBODY: HCV Ab: >11 s/co ratio — ABNORMAL HIGH (ref 0.0–0.9)

## 2016-10-01 LAB — HEPATITIS B SURFACE ANTIGEN: Hepatitis B Surface Ag: NEGATIVE

## 2016-10-01 LAB — ALPHA-1-ANTITRYPSIN: A-1 Antitrypsin, Ser: 121 mg/dL (ref 90–200)

## 2016-10-01 LAB — HEPATITIS B SURFACE ANTIBODY,QUALITATIVE: Hep B S Ab: REACTIVE

## 2016-10-05 ENCOUNTER — Telehealth: Payer: Self-pay | Admitting: Gastroenterology

## 2016-10-05 NOTE — Telephone Encounter (Signed)
Patient received correspondence from [facility] denied her testing prior to treatment for hep C.. [employer] told Jennifer Vasquez to resubmit that its medically necessary to have all test to progress with treatment.

## 2016-10-06 LAB — MITOCHONDRIAL ANTIBODIES: Mitochondrial M2 Ab, IgG: 12.3 Units (ref 0.0–20.0)

## 2016-10-06 LAB — ANTI-SMOOTH MUSCLE ANTIBODY, IGG: F-ACTIN AB IGG: 11 U (ref 0–19)

## 2016-10-06 NOTE — Telephone Encounter (Signed)
[employer] 1- [phone]

## 2016-10-07 LAB — HCV RT-PCR, QUANT (NON-GRAPH)
HCV Log 10: 5.903 Log10copy/mL
HCV QUANTITATIVE BASELINE: 320000 [IU]/mL
Hep C Qn: 800000 Copies/mL
IU LOG10: 5.505 {Log_IU}/mL

## 2016-10-07 LAB — HEPATITIS C GENOTYPE

## 2016-10-11 ENCOUNTER — Other Ambulatory Visit: Payer: Self-pay

## 2016-10-11 ENCOUNTER — Telehealth: Payer: Self-pay | Admitting: Gastroenterology

## 2016-10-11 NOTE — Telephone Encounter (Signed)
results

## 2016-10-11 NOTE — Telephone Encounter (Signed)
Pt notified of results. Paperwork completed and faxed to [facility] pharmacy.

## 2016-10-19 ENCOUNTER — Telehealth: Payer: Self-pay | Admitting: Gastroenterology

## 2016-10-19 ENCOUNTER — Other Ambulatory Visit: Payer: Self-pay

## 2016-10-19 MED ORDER — ONDANSETRON 4 MG PO TBDP: 4.0000 mg | ORAL_TABLET | Freq: Three times a day (TID) | ORAL | 2 refills | Status: DC | PRN

## 2016-10-19 NOTE — Telephone Encounter (Signed)
Patient called and she received her medication Harvoni and she has a couple questions. She is feeling very nauseous from the the medication. Please call her back.

## 2016-10-20 ENCOUNTER — Encounter: Payer: Self-pay | Admitting: Gastroenterology

## 2016-10-20 NOTE — Telephone Encounter (Signed)
Pt was given Zofran for her nausea. Ok'd by Dr. Servando Snare as she is being treated for Hep C.

## 2016-10-20 NOTE — Telephone Encounter (Signed)
Error

## 2016-10-21 ENCOUNTER — Telehealth: Payer: Self-pay | Admitting: Gastroenterology

## 2016-10-21 NOTE — Telephone Encounter (Signed)
Patient LVM and is having a problem with her new medication. She is having severe racing heart. Please call and thinks she may need a medication change.

## 2016-10-21 NOTE — Telephone Encounter (Signed)
No she should continue the Harvoni.

## 2016-10-21 NOTE — Telephone Encounter (Signed)
Pt is currently being treated with Harvoni. Spoke with her regarding her symptoms. Pt is having light headedness, headache and her heart is racing. She also just started taking Lyrica a week and a half ago. I advised her to contact the pharmacy to see if there was an interaction between the two and they said no. Should I have her to discontinue Harvoni and send in a request for a different Hep C medication?

## 2016-10-22 NOTE — Telephone Encounter (Signed)
Pt returned call stating it was not the Harvoni causing her symptoms. It was her pain medications. Pt will continue Harvoni.

## 2016-10-24 ENCOUNTER — Emergency Department
Admission: EM | Admit: 2016-10-24 | Discharge: 2016-10-25 | Disposition: A | Discharge status: Home / Self Care | Payer: BLUE CROSS/BLUE SHIELD | Attending: Emergency Medicine | Admitting: Emergency Medicine

## 2016-10-24 ENCOUNTER — Emergency Department: Payer: BLUE CROSS/BLUE SHIELD

## 2016-10-24 DIAGNOSIS — R609 Edema, unspecified: Secondary | ICD-10-CM

## 2016-10-24 DIAGNOSIS — J441 Chronic obstructive pulmonary disease with (acute) exacerbation: Secondary | ICD-10-CM | POA: Insufficient documentation

## 2016-10-24 DIAGNOSIS — F17210 Nicotine dependence, cigarettes, uncomplicated: Secondary | ICD-10-CM | POA: Diagnosis not present

## 2016-10-24 DIAGNOSIS — Y999 Unspecified external cause status: Secondary | ICD-10-CM | POA: Diagnosis not present

## 2016-10-24 DIAGNOSIS — Y929 Unspecified place or not applicable: Secondary | ICD-10-CM | POA: Insufficient documentation

## 2016-10-24 DIAGNOSIS — X58XXXA Exposure to other specified factors, initial encounter: Secondary | ICD-10-CM | POA: Insufficient documentation

## 2016-10-24 DIAGNOSIS — R0602 Shortness of breath: Secondary | ICD-10-CM

## 2016-10-24 DIAGNOSIS — Z791 Long term (current) use of non-steroidal anti-inflammatories (NSAID): Secondary | ICD-10-CM | POA: Diagnosis not present

## 2016-10-24 DIAGNOSIS — Z79899 Other long term (current) drug therapy: Secondary | ICD-10-CM | POA: Insufficient documentation

## 2016-10-24 DIAGNOSIS — S20111A Abrasion of breast, right breast, initial encounter: Secondary | ICD-10-CM | POA: Diagnosis not present

## 2016-10-24 DIAGNOSIS — Y939 Activity, unspecified: Secondary | ICD-10-CM | POA: Insufficient documentation

## 2016-10-24 LAB — BASIC METABOLIC PANEL
Anion gap: 8 (ref 5–15)
BUN: 11 mg/dL (ref 6–20)
CHLORIDE: 108 mmol/L (ref 101–111)
CO2: 22 mmol/L (ref 22–32)
CREATININE: 0.92 mg/dL (ref 0.44–1.00)
Calcium: 9.1 mg/dL (ref 8.9–10.3)
GFR calc Af Amer: >60 mL/min (ref >60)
GLUCOSE: 127 mg/dL — AB (ref 65–99)
POTASSIUM: 3.6 mmol/L (ref 3.5–5.1)
SODIUM: 138 mmol/L (ref 135–145)

## 2016-10-24 LAB — CBC
HEMATOCRIT: 35.5 % (ref 35.0–47.0)
Hemoglobin: 12.6 g/dL (ref 12.0–16.0)
MCH: 33.6 pg (ref 26.0–34.0)
MCHC: 35.4 g/dL (ref 32.0–36.0)
MCV: 95 fL (ref 80.0–100.0)
PLATELETS: 200 10*3/uL (ref 150–440)
RBC: 3.74 MIL/uL — ABNORMAL LOW (ref 3.80–5.20)
RDW: 12.7 % (ref 11.5–14.5)
WBC: 10.6 10*3/uL (ref 3.6–11.0)

## 2016-10-24 LAB — TROPONIN I: Troponin I: <0.03 ng/mL (ref <0.03)

## 2016-10-24 LAB — POCT PREGNANCY, URINE: Preg Test, Ur: NEGATIVE

## 2016-10-24 LAB — BRAIN NATRIURETIC PEPTIDE: B Natriuretic Peptide: 20 pg/mL (ref 0.0–100.0)

## 2016-10-24 MED ORDER — IPRATROPIUM-ALBUTEROL 0.5-2.5 (3) MG/3ML IN SOLN
3.0000 mL | Freq: Once | RESPIRATORY_TRACT | Status: AC
  Administered 2016-10-24: 3 mL via RESPIRATORY_TRACT
  Filled 2016-10-24: qty 3

## 2016-10-24 MED ORDER — PREDNISONE 20 MG PO TABS
60.0000 mg | ORAL_TABLET | Freq: Once | ORAL | Status: AC
  Administered 2016-10-24: 60 mg via ORAL
  Filled 2016-10-24: qty 3

## 2016-10-24 NOTE — ED Notes (Signed)
Report from shannin, rn.  

## 2016-10-24 NOTE — ED Triage Notes (Signed)
Patient ambulatory to triage with complaints of chest pain (left side radiating to neck 5/10 - sharp) since 9am today with SOB.    Pt denies N/V/D/HA/chills/sweating/fever. Pt reports starting Harvoni  for HepC approx 1 week ago and c/o swelling "all over" and blood coming from her right nipple.  Speaking in complete coherent sentences. No acute breathing distress noted.

## 2016-10-24 NOTE — ED Notes (Signed)
Aunt and first cousin with hx of breast cancer. Last examination done around age 31.Pt denies mammogram and imaging since then

## 2016-10-25 LAB — HEPATIC FUNCTION PANEL
ALT: 19 U/L (ref 14–54)
AST: 20 U/L (ref 15–41)
Albumin: 4 g/dL (ref 3.5–5.0)
Alkaline Phosphatase: 52 U/L (ref 38–126)
Total Bilirubin: 0.3 mg/dL (ref 0.3–1.2)
Total Protein: 7 g/dL (ref 6.5–8.1)

## 2016-10-25 MED ORDER — PREDNISONE 20 MG PO TABS
60.0000 mg | ORAL_TABLET | Freq: Every day | ORAL | 0 refills | Status: DC
Start: 2016-10-25 — End: 2016-11-15

## 2016-10-25 MED ORDER — FUROSEMIDE 20 MG PO TABS: 20.0000 mg | ORAL_TABLET | Freq: Every day | ORAL | 0 refills | Status: DC

## 2016-10-25 NOTE — Discharge Instructions (Signed)
The blood work we evaluated is unremarkable. I feel that if shortness of breath may be due to his COPD since he had some wheezing on exam. I will place her on steroids. Please continue taking your inhalers and nebulizer treatments as needed. I will also give you a fluid pill to help with some of the swelling. At this time I think you should contact her physician before restarting your Harvoni. Please return with any worsening conditions or any concerning symptoms.

## 2016-10-25 NOTE — ED Provider Notes (Signed)
[facility] Emergency Department Provider Note   ____________________________________________   First MD Initiated Contact with Patient 10/24/16 2310     (approximate)  I have reviewed the triage vital signs and the nursing notes.   HISTORY  Chief Complaint Chest Pain and Allergic Reaction    HPI Jennifer Vasquez is a 31 y.o. female who comes into the hospital today with shortness of breath. The patient reports she is also had some swelling to her body and thinks he may be a reaction to some new medication. The patient started taking her Harvoni on Tuesday and reports that she started taking Lyrica approximately 5 days before that. She reports that she's had some shortness of breath for the past 3 days. She's been using her inhalers at home but she reports that has not been helping. Tonight she went into the shower and her right nipple started bleeding which is why she came into the hospital. The patient reports that she has had some wheezing as well. The patient has a cough that is nonproductive. She is use to much albuterol she states that she's had a headache. The patient reports that the shortness of breath is worse at night when she lays down. She reports that she did contact her physician and was told that if it gets worse she should come to the emergency department. She thinks that she is retaining fluid and has had some intermittent chest pains. The patient has had some nausea with vomiting although nothing currently. She denies any abdominal pain. The patient reports that on Friday her heart rate was elevated blood pressure was elevated as well but currently does not she reports that her shortness of breath is not severe. He is here today for evaluation.   Past Medical History:  Diagnosis Date  . Acute carpal tunnel syndrome 10/10/2014  . Anxiety state 09/03/2009  . Asthma   . Bipolar disorder (HCC) 02/09/2013  . DDD (degenerative disc disease), cervical  11/13/2014  . DDD (degenerative disc disease), lumbar 11/13/2014  . Hepatitis C   . Lumbosacral radiculopathy 11/13/2014  . Tobacco use disorder 09/06/2012    Patient Active Problem List   Diagnosis Date Noted  . DDD (degenerative disc disease), lumbar 11/13/2014  . DDD (degenerative disc disease), cervical 11/13/2014  . Lumbosacral radiculopathy 11/13/2014  . Acute carpal tunnel syndrome 10/10/2014  . Chronic pain 03/02/2013  . Depression 03/02/2013  . Bipolar disorder (HCC) 02/09/2013  . Hepatitis C 12/18/2012  . Asthma 11/23/2012  . Neck pain 11/23/2012  . Torticollis 11/23/2012  . Tobacco use disorder 09/06/2012  . Anxiety state 09/03/2009    Past Surgical History:  Procedure Laterality Date  . CERVICAL DISC ARTHROPLASTY    . TONSILLECTOMY    . TUBAL LIGATION      Prior to Admission medications   Medication Sig Start Date End Date Taking? Authorizing Provider  albuterol (PROVENTIL HFA;VENTOLIN HFA) 108 (90 Base) MCG/ACT inhaler Inhale 1 puff into the lungs every 6 (six) hours as needed for wheezing or shortness of breath.    Historical Provider, MD  albuterol-ipratropium (COMBIVENT) 18-103 MCG/ACT inhaler Inhale 2 puffs into the lungs 3 (three) times daily.    Historical Provider, MD  buPROPion (WELLBUTRIN SR) 100 MG 12 hr tablet Take 100 mg by mouth 2 (two) times daily.     Historical Provider, MD  cyclobenzaprine (FLEXERIL) 10 MG tablet  06/04/16   Historical Provider, MD  fluticasone (FLONASE) 50 MCG/ACT nasal spray  06/11/16  Historical Provider, MD  Fluticasone-Salmeterol (ADVAIR DISKUS) 250-50 MCG/DOSE AEPB 1 inhalation 2 (two) times daily. 04/01/14   Historical Provider, MD  furosemide (LASIX) 20 MG tablet Take 1 tablet (20 mg total) by mouth daily. 10/25/16 10/25/17  Rebecka Apley, MD  HYDROcodone-acetaminophen (NORCO) 10-325 MG tablet Take 1 tablet by mouth every 6 (six) hours as needed.    Historical Provider, MD  ibuprofen (ADVIL,MOTRIN) 800 MG tablet Take 800 mg by  mouth every 6 (six) hours as needed for fever or mild pain.     Historical Provider, MD  levothyroxine (SYNTHROID, LEVOTHROID) 25 MCG tablet Take 25 mcg by mouth daily before breakfast.    Historical Provider, MD  naproxen (NAPROSYN) 500 MG tablet Take 1 tablet (500 mg total) by mouth 2 (two) times daily with a meal. Patient not taking: Reported on 07/06/2016 12/17/14   Jene Every, MD  ondansetron (ZOFRAN ODT) 4 MG disintegrating tablet Take 1 tablet (4 mg total) by mouth every 8 (eight) hours as needed for nausea or vomiting. 10/19/16   Midge Minium, MD  ondansetron (ZOFRAN) 4 MG tablet Take 1 tablet (4 mg total) by mouth daily as needed for nausea or vomiting. 04/23/16   Jene Every, MD  predniSONE (DELTASONE) 20 MG tablet Take 3 tablets (60 mg total) by mouth daily. 10/25/16   Rebecka Apley, MD  traZODone (DESYREL) 50 MG tablet Take 50 mg by mouth at bedtime as needed for sleep.    Historical Provider, MD    Allergies Acetaminophen; Amoxicillin-pot clavulanate; Gabapentin; Tetanus toxoids; Tramadol; and Shellfish allergy  Family History  Problem Relation Age of Onset  . Arthritis Mother   . Asthma Mother   . Diabetes Mother   . Hyperlipidemia Mother   . Vision loss Mother   . Alcohol abuse Father   . Asthma Father   . Diabetes Father   . Heart disease Father   . Depression Father   . COPD Father   . Cancer Father   . Hypertension Father   . Kidney disease Father   . Vision loss Father     Social History Social History  Substance Use Topics  . Smoking status: Current Some Day Smoker    Packs/day: 5.00    Types: Cigarettes  . Smokeless tobacco: Never Used  . Alcohol use No    Review of Systems Constitutional: No fever/chills Eyes: No visual changes. ENT: No sore throat. Cardiovascular: Intermittent chest pain. Respiratory:  shortness of breath. Gastrointestinal: Nausea and vomiting with No abdominal pain.  No diarrhea.  No constipation. Genitourinary: Negative  for dysuria. Musculoskeletal: Negative for back pain. Skin: Negative for rash. Neurological: Negative for headaches, focal weakness or numbness. Lymph: swelling to entire body  10-point ROS otherwise negative.  ____________________________________________   PHYSICAL EXAM:  VITAL SIGNS: ED Triage Vitals  Enc Vitals Group     BP 10/24/16 2146 (!) 128/91     Pulse Rate 10/24/16 2146 (!) 108     Resp 10/24/16 2146 18     Temp 10/24/16 2146 99.1 F (37.3 C)     Temp Source 10/24/16 2146 Oral     SpO2 10/24/16 2146 94 %     Weight 10/24/16 2147 176 lb (79.8 kg)     Height 10/24/16 2147 5\' 3"  (1.6 m)     Head Circumference --      Peak Flow --      Pain Score 10/24/16 2146 5     Pain Loc --  Pain Edu? --      Excl. in GC? --     Constitutional: Alert and oriented. Well appearing and in no acute distress. Eyes: Conjunctivae are normal. PERRL. EOMI. Head: Atraumatic. Nose: No congestion/rhinnorhea. Mouth/Throat: Mucous membranes are moist.  Oropharynx non-erythematous. Cardiovascular: Normal rate, regular rhythm. Grossly normal heart sounds.  Good peripheral circulation. Respiratory: Normal respiratory effort.  No retractions. mild do expiratory wheezes Gastrointestinal: Soft and nontender. No distention. Positive bowel sounds Musculoskeletal: No lower extremity tenderness nor edema.   Neurologic:  Normal speech and language. Skin:  Skin is warm, dry, abrasion with a peeled away scab on the right nipple with no active bleeding. Psychiatric: Mood and affect are normal.   ____________________________________________   LABS (all labs ordered are listed, but only abnormal results are displayed)  Labs Reviewed  BASIC METABOLIC PANEL - Abnormal; Notable for the following:       Result Value   Glucose, Bld 127 (*)    All other components within normal limits  CBC - Abnormal; Notable for the following:    RBC 3.74 (*)    All other components within normal limits  HEPATIC  FUNCTION PANEL - Abnormal; Notable for the following:    Bilirubin, Direct <0.1 (*)    All other components within normal limits  TROPONIN I  BRAIN NATRIURETIC PEPTIDE  POC URINE PREG, ED  POCT PREGNANCY, URINE   ____________________________________________  EKG  ED ECG REPORT I, Rebecka Apley, the attending physician, personally viewed and interpreted this ECG.   Date: 10/25/2016  EKG Time: 2147  Rate: 105  Rhythm: sinus tachycardia  Axis: normal  Intervals:none  ST&T Change: none  ____________________________________________  RADIOLOGY  CXR ____________________________________________   PROCEDURES  Procedure(s) performed: None  Procedures  Critical Care performed: No  ____________________________________________   INITIAL IMPRESSION / ASSESSMENT AND PLAN / ED COURSE  Pertinent labs & imaging results that were available during my care of the patient were reviewed by me and considered in my medical decision making (see chart for details).  This is a 31 year old female who comes into the hospital today with shortness of breath. The patient was sleeping when I went into the room to evaluate her. She reports that her symptoms are not very severe but she wanted to come in to be evaluated. The patient does have some wheezing so I feel her shortness of breath may be contributed to either her asthma or her COPD. I will give the patient a DuoNeb treatment as well as some prednisone. Regarding the patient's nipple bleeding she does have an abrasion to her right nipple. Unable to express any blood from her nipple. I feel that the bleeding from the patient's nipple is from an injury and do not have any concern at this time. The patient's blood work is also unremarkable. I did check a BNP because of the patient's orthopnea while I do not see any significant peripheral edema the patient reports that she has edema and swelling.  Clinical Course as of Oct 26 230  Sun Oct 24, 2016  2303 No active cardiopulmonary disease. DG Chest 2 View [AW]    Clinical Course User Index [AW] Rebecka Apley, MD   The patient's shortness of breath is improved after the DuoNeb. She also reports that she has to go home to relieve her babysitter. She was sleeping comfortably as well. I will give the patient some steroids for home with a concern for COPD exacerbation. I will also give the  patient some Lasix to help with swelling. The patient should follow-up with her doctor. She should hold her new medication until she discusses it with her physician. The patient should return with any other questions or concerns.  ____________________________________________   FINAL CLINICAL IMPRESSION(S) / ED DIAGNOSES  Final diagnoses:  Shortness of breath  COPD exacerbation (HCC)  Edema, unspecified type      NEW MEDICATIONS STARTED DURING THIS VISIT:  Discharge Medication List as of 10/25/2016 12:53 AM    START taking these medications   Details  furosemide (LASIX) 20 MG tablet Take 1 tablet (20 mg total) by mouth daily., Starting Mon 10/25/2016, Until Tue 10/25/2017, Print    predniSONE (DELTASONE) 20 MG tablet Take 3 tablets (60 mg total) by mouth daily., Starting Mon 10/25/2016, Print         Note:  This document was prepared using Dragon voice recognition software and may include unintentional dictation errors.    Rebecka Apley, MD 10/25/16 [phone]

## 2016-10-26 ENCOUNTER — Telehealth: Payer: Self-pay | Admitting: Gastroenterology

## 2016-10-26 NOTE — Telephone Encounter (Signed)
Went to ED 2 days ago, swollen all over, was told to stop taking Harvoni. Having some trouble with medications and other issues. Constipated also. Please call today

## 2016-10-26 NOTE — Telephone Encounter (Signed)
Please advise 

## 2016-11-04 ENCOUNTER — Ambulatory Visit
Admission: RE | Admit: 2016-11-04 | Discharge: 2016-11-04 | Disposition: A | Discharge status: Home / Self Care | Payer: BLUE CROSS/BLUE SHIELD | Source: Ambulatory Visit | Attending: Nurse Practitioner | Admitting: Nurse Practitioner

## 2016-11-04 ENCOUNTER — Other Ambulatory Visit: Payer: Self-pay | Admitting: Nurse Practitioner

## 2016-11-04 DIAGNOSIS — R05 Cough: Secondary | ICD-10-CM

## 2016-11-04 DIAGNOSIS — J189 Pneumonia, unspecified organism: Secondary | ICD-10-CM

## 2016-11-04 DIAGNOSIS — R0602 Shortness of breath: Secondary | ICD-10-CM | POA: Diagnosis not present

## 2016-11-04 DIAGNOSIS — A419 Sepsis, unspecified organism: Secondary | ICD-10-CM | POA: Diagnosis not present

## 2016-11-04 DIAGNOSIS — R059 Cough, unspecified: Secondary | ICD-10-CM

## 2016-11-05 ENCOUNTER — Emergency Department: Payer: BLUE CROSS/BLUE SHIELD

## 2016-11-05 ENCOUNTER — Inpatient Hospital Stay
Admission: EM | Admit: 2016-11-05 | Discharge: 2016-11-15 | DRG: 870 | Disposition: A | Discharge status: Home Health | Payer: BLUE CROSS/BLUE SHIELD | Attending: Internal Medicine | Admitting: Internal Medicine

## 2016-11-05 ENCOUNTER — Encounter: Payer: Self-pay | Admitting: Emergency Medicine

## 2016-11-05 DIAGNOSIS — I509 Heart failure, unspecified: Secondary | ICD-10-CM

## 2016-11-05 DIAGNOSIS — J9690 Respiratory failure, unspecified, unspecified whether with hypoxia or hypercapnia: Secondary | ICD-10-CM | POA: Diagnosis present

## 2016-11-05 DIAGNOSIS — R0602 Shortness of breath: Secondary | ICD-10-CM

## 2016-11-05 DIAGNOSIS — G92 Toxic encephalopathy: Secondary | ICD-10-CM | POA: Diagnosis not present

## 2016-11-05 DIAGNOSIS — Z79899 Other long term (current) drug therapy: Secondary | ICD-10-CM

## 2016-11-05 DIAGNOSIS — J1108 Influenza due to unidentified influenza virus with specified pneumonia: Secondary | ICD-10-CM | POA: Diagnosis not present

## 2016-11-05 DIAGNOSIS — J13 Pneumonia due to Streptococcus pneumoniae: Secondary | ICD-10-CM | POA: Diagnosis present

## 2016-11-05 DIAGNOSIS — F319 Bipolar disorder, unspecified: Secondary | ICD-10-CM | POA: Diagnosis present

## 2016-11-05 DIAGNOSIS — J1008 Influenza due to other identified influenza virus with other specified pneumonia: Secondary | ICD-10-CM | POA: Diagnosis present

## 2016-11-05 DIAGNOSIS — J1289 Other viral pneumonia: Secondary | ICD-10-CM | POA: Diagnosis not present

## 2016-11-05 DIAGNOSIS — J440 Chronic obstructive pulmonary disease with acute lower respiratory infection: Secondary | ICD-10-CM | POA: Diagnosis present

## 2016-11-05 DIAGNOSIS — K219 Gastro-esophageal reflux disease without esophagitis: Secondary | ICD-10-CM | POA: Diagnosis present

## 2016-11-05 DIAGNOSIS — D649 Anemia, unspecified: Secondary | ICD-10-CM | POA: Diagnosis not present

## 2016-11-05 DIAGNOSIS — Z886 Allergy status to analgesic agent status: Secondary | ICD-10-CM

## 2016-11-05 DIAGNOSIS — F17210 Nicotine dependence, cigarettes, uncomplicated: Secondary | ICD-10-CM | POA: Diagnosis present

## 2016-11-05 DIAGNOSIS — J690 Pneumonitis due to inhalation of food and vomit: Secondary | ICD-10-CM | POA: Diagnosis present

## 2016-11-05 DIAGNOSIS — R509 Fever, unspecified: Secondary | ICD-10-CM | POA: Diagnosis not present

## 2016-11-05 DIAGNOSIS — J101 Influenza due to other identified influenza virus with other respiratory manifestations: Secondary | ICD-10-CM | POA: Diagnosis present

## 2016-11-05 DIAGNOSIS — R5381 Other malaise: Secondary | ICD-10-CM | POA: Diagnosis not present

## 2016-11-05 DIAGNOSIS — Z8249 Family history of ischemic heart disease and other diseases of the circulatory system: Secondary | ICD-10-CM

## 2016-11-05 DIAGNOSIS — J80 Acute respiratory distress syndrome: Secondary | ICD-10-CM | POA: Diagnosis present

## 2016-11-05 DIAGNOSIS — Z9851 Tubal ligation status: Secondary | ICD-10-CM | POA: Diagnosis not present

## 2016-11-05 DIAGNOSIS — R000 Tachycardia, unspecified: Secondary | ICD-10-CM | POA: Diagnosis not present

## 2016-11-05 DIAGNOSIS — R0902 Hypoxemia: Secondary | ICD-10-CM

## 2016-11-05 DIAGNOSIS — J9601 Acute respiratory failure with hypoxia: Secondary | ICD-10-CM | POA: Diagnosis not present

## 2016-11-05 DIAGNOSIS — I5031 Acute diastolic (congestive) heart failure: Secondary | ICD-10-CM | POA: Diagnosis present

## 2016-11-05 DIAGNOSIS — Z880 Allergy status to penicillin: Secondary | ICD-10-CM

## 2016-11-05 DIAGNOSIS — J9600 Acute respiratory failure, unspecified whether with hypoxia or hypercapnia: Secondary | ICD-10-CM

## 2016-11-05 DIAGNOSIS — R062 Wheezing: Secondary | ICD-10-CM | POA: Diagnosis not present

## 2016-11-05 DIAGNOSIS — A419 Sepsis, unspecified organism: Secondary | ICD-10-CM | POA: Diagnosis present

## 2016-11-05 DIAGNOSIS — R739 Hyperglycemia, unspecified: Secondary | ICD-10-CM | POA: Diagnosis present

## 2016-11-05 DIAGNOSIS — Z452 Encounter for adjustment and management of vascular access device: Secondary | ICD-10-CM

## 2016-11-05 DIAGNOSIS — Z981 Arthrodesis status: Secondary | ICD-10-CM

## 2016-11-05 DIAGNOSIS — F1910 Other psychoactive substance abuse, uncomplicated: Secondary | ICD-10-CM | POA: Diagnosis present

## 2016-11-05 DIAGNOSIS — I248 Other forms of acute ischemic heart disease: Secondary | ICD-10-CM | POA: Diagnosis present

## 2016-11-05 DIAGNOSIS — J441 Chronic obstructive pulmonary disease with (acute) exacerbation: Secondary | ICD-10-CM | POA: Diagnosis present

## 2016-11-05 DIAGNOSIS — Z7951 Long term (current) use of inhaled steroids: Secondary | ICD-10-CM

## 2016-11-05 DIAGNOSIS — J189 Pneumonia, unspecified organism: Secondary | ICD-10-CM | POA: Diagnosis not present

## 2016-11-05 DIAGNOSIS — R451 Restlessness and agitation: Secondary | ICD-10-CM | POA: Diagnosis present

## 2016-11-05 DIAGNOSIS — Z91013 Allergy to seafood: Secondary | ICD-10-CM

## 2016-11-05 DIAGNOSIS — R410 Disorientation, unspecified: Secondary | ICD-10-CM | POA: Diagnosis present

## 2016-11-05 DIAGNOSIS — B1920 Unspecified viral hepatitis C without hepatic coma: Secondary | ICD-10-CM | POA: Diagnosis present

## 2016-11-05 DIAGNOSIS — E876 Hypokalemia: Secondary | ICD-10-CM | POA: Diagnosis not present

## 2016-11-05 DIAGNOSIS — Z885 Allergy status to narcotic agent status: Secondary | ICD-10-CM

## 2016-11-05 DIAGNOSIS — J9602 Acute respiratory failure with hypercapnia: Secondary | ICD-10-CM | POA: Diagnosis not present

## 2016-11-05 DIAGNOSIS — J1100 Influenza due to unidentified influenza virus with unspecified type of pneumonia: Secondary | ICD-10-CM | POA: Diagnosis not present

## 2016-11-05 DIAGNOSIS — Z887 Allergy status to serum and vaccine status: Secondary | ICD-10-CM

## 2016-11-05 HISTORY — DX: Heart failure, unspecified: I50.9

## 2016-11-05 HISTORY — DX: Chronic obstructive pulmonary disease, unspecified: J44.9

## 2016-11-05 LAB — BLOOD GAS, ARTERIAL
ACID-BASE EXCESS: 3.1 mmol/L — AB (ref 0.0–2.0)
Bicarbonate: 29 mmol/L — ABNORMAL HIGH (ref 20.0–28.0)
FIO2: 1
MECHVT: 500 mL
Mechanical Rate: 20
O2 SAT: 99.8 %
PEEP/CPAP: 5 cmH2O
Patient temperature: 37
pCO2 arterial: 49 mmHg — ABNORMAL HIGH (ref 32.0–48.0)
pH, Arterial: 7.38 (ref 7.350–7.450)
pO2, Arterial: 214 mmHg — ABNORMAL HIGH (ref 83.0–108.0)

## 2016-11-05 LAB — CBC WITH DIFFERENTIAL/PLATELET
BASOS ABS: 0 10*3/uL (ref 0–0.1)
BASOS PCT: 0 %
EOS ABS: 0 10*3/uL (ref 0–0.7)
Eosinophils Relative: 0 %
HEMATOCRIT: 39 % (ref 35.0–47.0)
HEMOGLOBIN: 13.1 g/dL (ref 12.0–16.0)
Lymphocytes Relative: 15 %
Lymphs Abs: 4.1 10*3/uL — ABNORMAL HIGH (ref 1.0–3.6)
MCH: 33.2 pg (ref 26.0–34.0)
MCHC: 33.7 g/dL (ref 32.0–36.0)
MCV: 98.4 fL (ref 80.0–100.0)
MONOS PCT: 4 %
Monocytes Absolute: 1 10*3/uL — ABNORMAL HIGH (ref 0.2–0.9)
NEUTROS ABS: 22.3 10*3/uL — AB (ref 1.4–6.5)
NEUTROS PCT: 81 %
Platelets: 216 10*3/uL (ref 150–440)
RBC: 3.96 MIL/uL (ref 3.80–5.20)
RDW: 13.6 % (ref 11.5–14.5)
WBC: 27.4 10*3/uL — ABNORMAL HIGH (ref 3.6–11.0)

## 2016-11-05 LAB — URINALYSIS, ROUTINE W REFLEX MICROSCOPIC
Bilirubin Urine: NEGATIVE
GLUCOSE, UA: NEGATIVE mg/dL
Hgb urine dipstick: NEGATIVE
Ketones, ur: NEGATIVE mg/dL
LEUKOCYTES UA: NEGATIVE
NITRITE: NEGATIVE
PROTEIN: 100 mg/dL — AB
SPECIFIC GRAVITY, URINE: 1.025 (ref 1.005–1.030)
pH: 5 (ref 5.0–8.0)

## 2016-11-05 LAB — LACTIC ACID, PLASMA: LACTIC ACID, VENOUS: 1.4 mmol/L (ref 0.5–1.9)

## 2016-11-05 LAB — COMPREHENSIVE METABOLIC PANEL
ALK PHOS: 67 U/L (ref 38–126)
ALT: 18 U/L (ref 14–54)
ANION GAP: 9 (ref 5–15)
AST: 31 U/L (ref 15–41)
Albumin: 3.6 g/dL (ref 3.5–5.0)
BUN: 16 mg/dL (ref 6–20)
CALCIUM: 9.2 mg/dL (ref 8.9–10.3)
CO2: 31 mmol/L (ref 22–32)
CREATININE: 0.94 mg/dL (ref 0.44–1.00)
Chloride: 98 mmol/L — ABNORMAL LOW (ref 101–111)
GFR calc Af Amer: >60 mL/min (ref >60)
Glucose, Bld: 109 mg/dL — ABNORMAL HIGH (ref 65–99)
Potassium: 4.1 mmol/L (ref 3.5–5.1)
SODIUM: 138 mmol/L (ref 135–145)
Total Bilirubin: 0.6 mg/dL (ref 0.3–1.2)
Total Protein: 7.6 g/dL (ref 6.5–8.1)

## 2016-11-05 LAB — TROPONIN I: Troponin I: 0.18 ng/mL (ref <0.03)

## 2016-11-05 MED ORDER — FENTANYL CITRATE (PF) 100 MCG/2ML IJ SOLN
INTRAMUSCULAR | Status: AC
  Filled 2016-11-05: qty 2

## 2016-11-05 MED ORDER — FENTANYL CITRATE (PF) 100 MCG/2ML IJ SOLN
100.0000 ug | Freq: Once | INTRAMUSCULAR | Status: AC
Start: 2016-11-05 — End: 2016-11-06
  Administered 2016-11-05: 100 ug via INTRAVENOUS

## 2016-11-05 MED ORDER — SODIUM CHLORIDE 0.9 % IV SOLN
INTRAVENOUS | Status: DC
  Administered 2016-11-06 (×2): via INTRAVENOUS
  Administered 2016-11-08: 100 mL/h via INTRAVENOUS

## 2016-11-05 MED ORDER — PROPOFOL 1000 MG/100ML IV EMUL
5.0000 ug/kg/min | INTRAVENOUS | Status: DC
  Filled 2016-11-05: qty 100

## 2016-11-05 MED ORDER — IPRATROPIUM-ALBUTEROL 0.5-2.5 (3) MG/3ML IN SOLN
3.0000 mL | Freq: Once | RESPIRATORY_TRACT | Status: AC
  Administered 2016-11-05: 3 mL via RESPIRATORY_TRACT

## 2016-11-05 MED ORDER — ENOXAPARIN SODIUM 40 MG/0.4ML ~~LOC~~ SOLN
40.0000 mg | SUBCUTANEOUS | Status: DC
  Administered 2016-11-06 – 2016-11-14 (×9): 40 mg via SUBCUTANEOUS
  Filled 2016-11-05 (×9): qty 0.4

## 2016-11-05 MED ORDER — SODIUM CHLORIDE 0.9 % IV SOLN
1500.0000 mg | Freq: Once | INTRAVENOUS | Status: DC
  Filled 2016-11-05: qty 1500

## 2016-11-05 MED ORDER — SODIUM CHLORIDE 0.9% FLUSH
3.0000 mL | Freq: Two times a day (BID) | INTRAVENOUS | Status: DC
  Administered 2016-11-06 – 2016-11-14 (×19): 3 mL via INTRAVENOUS

## 2016-11-05 MED ORDER — FUROSEMIDE 10 MG/ML IJ SOLN
60.0000 mg | Freq: Once | INTRAMUSCULAR | Status: AC
  Administered 2016-11-05: 60 mg via INTRAVENOUS
  Filled 2016-11-05: qty 8

## 2016-11-05 MED ORDER — FENTANYL CITRATE (PF) 100 MCG/2ML IJ SOLN
100.0000 ug | Freq: Once | INTRAMUSCULAR | Status: AC
  Administered 2016-11-05: 100 ug via INTRAVENOUS

## 2016-11-05 MED ORDER — LEVOFLOXACIN IN D5W 750 MG/150ML IV SOLN
750.0000 mg | Freq: Once | INTRAVENOUS | Status: AC
  Administered 2016-11-05: 750 mg via INTRAVENOUS
  Filled 2016-11-05: qty 150

## 2016-11-05 MED ORDER — ETOMIDATE 2 MG/ML IV SOLN
20.0000 mg | Freq: Once | INTRAVENOUS | Status: AC
  Administered 2016-11-05: 20 mg via INTRAVENOUS

## 2016-11-05 MED ORDER — ONDANSETRON HCL 4 MG/2ML IJ SOLN
4.0000 mg | Freq: Four times a day (QID) | INTRAMUSCULAR | Status: DC | PRN
Start: 2016-11-05 — End: 2016-11-15

## 2016-11-05 MED ORDER — FENTANYL CITRATE (PF) 100 MCG/2ML IJ SOLN
100.0000 ug | Freq: Once | INTRAMUSCULAR | Status: AC
Start: 2016-11-05 — End: 2016-11-05
  Administered 2016-11-05: 100 ug via INTRAVENOUS

## 2016-11-05 MED ORDER — MIDAZOLAM HCL 2 MG/2ML IJ SOLN
2.0000 mg | Freq: Once | INTRAMUSCULAR | Status: AC
  Administered 2016-11-05: 2 mg via INTRAVENOUS

## 2016-11-05 MED ORDER — VECURONIUM BROMIDE 10 MG IV SOLR
4.0000 mg | Freq: Once | INTRAVENOUS | Status: AC
  Administered 2016-11-05: 4 mg via INTRAVENOUS
  Filled 2016-11-05: qty 10

## 2016-11-05 MED ORDER — SUCCINYLCHOLINE CHLORIDE 20 MG/ML IJ SOLN
100.0000 mg | Freq: Once | INTRAMUSCULAR | Status: AC
  Administered 2016-11-05: 100 mg via INTRAVENOUS

## 2016-11-05 MED ORDER — VECURONIUM BROMIDE 10 MG IV SOLR
8.0000 mg | Freq: Once | INTRAVENOUS | Status: AC
  Administered 2016-11-05: 8 mg via INTRAVENOUS
  Filled 2016-11-05: qty 10

## 2016-11-05 MED ORDER — VANCOMYCIN HCL 10 G IV SOLR
1250.0000 mg | Freq: Three times a day (TID) | INTRAVENOUS | Status: DC
  Administered 2016-11-06: 1250 mg via INTRAVENOUS
  Filled 2016-11-05 (×2): qty 1250

## 2016-11-05 MED ORDER — PROPOFOL 1000 MG/100ML IV EMUL
5.0000 ug/kg/min | Freq: Once | INTRAVENOUS | Status: AC
  Administered 2016-11-05: 10 ug/kg/min via INTRAVENOUS

## 2016-11-05 MED ORDER — SODIUM CHLORIDE 0.9 % IV SOLN
1.0000 mg/h | INTRAVENOUS | Status: AC
  Administered 2016-11-05: 1 mg/h via INTRAVENOUS
  Administered 2016-11-06 (×3): 6 mg/h via INTRAVENOUS
  Administered 2016-11-07 (×2): 8 mg/h via INTRAVENOUS
  Administered 2016-11-08: 4 mg/h via INTRAVENOUS
  Administered 2016-11-08 (×2): 8 mg/h via INTRAVENOUS
  Filled 2016-11-05 (×11): qty 10

## 2016-11-05 MED ORDER — ONDANSETRON HCL 4 MG PO TABS: 4.0000 mg | ORAL_TABLET | Freq: Four times a day (QID) | ORAL | Status: DC | PRN

## 2016-11-05 MED ORDER — CHLORHEXIDINE GLUCONATE 0.12% ORAL RINSE (MEDLINE KIT)
15.0000 mL | Freq: Two times a day (BID) | OROMUCOSAL | Status: DC
  Administered 2016-11-06 – 2016-11-13 (×16): 15 mL via OROMUCOSAL
  Filled 2016-11-05: qty 15

## 2016-11-05 MED ORDER — VANCOMYCIN HCL IN DEXTROSE 1-5 GM/200ML-% IV SOLN
1000.0000 mg | Freq: Once | INTRAVENOUS | Status: DC
  Filled 2016-11-05: qty 200

## 2016-11-05 MED ORDER — LEVOFLOXACIN IN D5W 750 MG/150ML IV SOLN
750.0000 mg | INTRAVENOUS | Status: AC
  Administered 2016-11-06 – 2016-11-11 (×6): 750 mg via INTRAVENOUS
  Filled 2016-11-05 (×6): qty 150

## 2016-11-05 MED ORDER — ORAL CARE MOUTH RINSE
15.0000 mL | OROMUCOSAL | Status: DC
  Administered 2016-11-06 – 2016-11-13 (×69): 15 mL via OROMUCOSAL
  Filled 2016-11-05 (×4): qty 15

## 2016-11-05 NOTE — ED Triage Notes (Signed)
Patient from home via [facility]. EMS reports patient having increasing shortness of breath worsening this evening. Patient reports history of COPD and CHF. Patient has cough with clear liquid production. Patient is unsure of fevers at home. Upon arrival, patient is lethargic and awakens to verbal stimuli. Auditory wheezing noted. MD at bedside.

## 2016-11-05 NOTE — H&P (Signed)
[facility] Physicians - [place] at [facility]   PATIENT NAME: Jennifer Vasquez    MR#:  [id number]  DATE OF BIRTH:  1985/11/26  DATE OF ADMISSION:  11/05/2016  PRIMARY CARE PHYSICIAN: Carlean Jews, NP   REQUESTING/REFERRING PHYSICIAN:   CHIEF COMPLAINT:   Chief Complaint  Patient presents with  . Shortness of Breath    HISTORY OF PRESENT ILLNESS: Jennifer Vasquez  is a 31 y.o. female with a known history of Congestive heart failure, bipolar disorder, bronchial asthma, COPD, degenerative joint disease, hepatitis C, tobacco abuse presented to the emergency room with shortness of breath and lethargy. Patient was in respiratory distress and her O2 saturation was around 70% to 80% on room air. Initially she was tried on BiPAP in the emergency room but she could not maintain her saturation and she was very lethargic and also had some cough. Not much history could be obtained on the patient. She was intubated by the ER physician put on ventilator. Patient was put on IV propofol drip and IV Versed drip for sedation. Troponin was elevated and chest x-ray showed left lung pneumonia-and patient was started on IV vancomycin and IV Levaquin antibiotic. Hospitalist service was consulted for further care of the patient. Chest x-ray also showed vascular congestion and fluid overload.  PAST MEDICAL HISTORY:   Past Medical History:  Diagnosis Date  . Acute carpal tunnel syndrome 10/10/2014  . Anxiety state 09/03/2009  . Asthma   . Bipolar disorder (HCC) 02/09/2013  . CHF (congestive heart failure) (HCC)   . COPD (chronic obstructive pulmonary disease) (HCC)   . DDD (degenerative disc disease), cervical 11/13/2014  . DDD (degenerative disc disease), lumbar 11/13/2014  . Hepatitis C   . Lumbosacral radiculopathy 11/13/2014  . Tobacco use disorder 09/06/2012    PAST SURGICAL HISTORY: Past Surgical History:  Procedure Laterality Date  . CERVICAL DISC ARTHROPLASTY    . TONSILLECTOMY    . TUBAL  LIGATION      SOCIAL HISTORY:  Social History  Substance Use Topics  . Smoking status: Current Some Day Smoker    Packs/day: 5.00    Types: Cigarettes  . Smokeless tobacco: Never Used  . Alcohol use No  No use of illicit drugs  FAMILY HISTORY:  Family History  Problem Relation Age of Onset  . Arthritis Mother   . Asthma Mother   . Diabetes Mother   . Hyperlipidemia Mother   . Vision loss Mother   . Alcohol abuse Father   . Asthma Father   . Diabetes Father   . Heart disease Father   . Depression Father   . COPD Father   . Cancer Father   . Hypertension Father   . Kidney disease Father   . Vision loss Father     DRUG ALLERGIES:  Allergies  Allergen Reactions  . Acetaminophen     Other reaction(s): Other (qualifier value) Due to hep c  . Amoxicillin-Pot Clavulanate Diarrhea and Nausea And Vomiting  . Gabapentin Hives  . Tetanus Toxoids   . Tramadol Hives  . Shellfish Allergy Rash    oysters    REVIEW OF SYSTEMS:  Could not be obtained Patient on ventilator  MEDICATIONS AT HOME:  Prior to Admission medications   Medication Sig Start Date End Date Taking? Authorizing Provider  albuterol (PROVENTIL HFA;VENTOLIN HFA) 108 (90 Base) MCG/ACT inhaler Inhale 1 puff into the lungs every 6 (six) hours as needed for wheezing or shortness of breath.    Historical  Provider, MD  albuterol-ipratropium (COMBIVENT) 18-103 MCG/ACT inhaler Inhale 2 puffs into the lungs 3 (three) times daily.    Historical Provider, MD  buPROPion (WELLBUTRIN SR) 100 MG 12 hr tablet Take 100 mg by mouth 2 (two) times daily.     Historical Provider, MD  cyclobenzaprine (FLEXERIL) 10 MG tablet  06/04/16   Historical Provider, MD  fluticasone Aleda Grana) 50 MCG/ACT nasal spray  06/11/16   Historical Provider, MD  Fluticasone-Salmeterol (ADVAIR DISKUS) 250-50 MCG/DOSE AEPB 1 inhalation 2 (two) times daily. 04/01/14   Historical Provider, MD  furosemide (LASIX) 20 MG tablet Take 1 tablet (20 mg total) by  mouth daily. 10/25/16 10/25/17  Rebecka Apley, MD  HYDROcodone-acetaminophen (NORCO) 10-325 MG tablet Take 1 tablet by mouth every 6 (six) hours as needed.    Historical Provider, MD  ibuprofen (ADVIL,MOTRIN) 800 MG tablet Take 800 mg by mouth every 6 (six) hours as needed for fever or mild pain.     Historical Provider, MD  levothyroxine (SYNTHROID, LEVOTHROID) 25 MCG tablet Take 25 mcg by mouth daily before breakfast.    Historical Provider, MD  naproxen (NAPROSYN) 500 MG tablet Take 1 tablet (500 mg total) by mouth 2 (two) times daily with a meal. Patient not taking: Reported on 07/06/2016 12/17/14   Jene Every, MD  ondansetron (ZOFRAN ODT) 4 MG disintegrating tablet Take 1 tablet (4 mg total) by mouth every 8 (eight) hours as needed for nausea or vomiting. 10/19/16   Midge Minium, MD  ondansetron (ZOFRAN) 4 MG tablet Take 1 tablet (4 mg total) by mouth daily as needed for nausea or vomiting. 04/23/16   Jene Every, MD  predniSONE (DELTASONE) 20 MG tablet Take 3 tablets (60 mg total) by mouth daily. 10/25/16   Rebecka Apley, MD  traZODone (DESYREL) 50 MG tablet Take 50 mg by mouth at bedtime as needed for sleep.    Historical Provider, MD      PHYSICAL EXAMINATION:   VITAL SIGNS: Blood pressure (!) 142/95, pulse (!) 102, temperature 98.1 F (36.7 C), temperature source Oral, resp. rate 20, height 5\' 3"  (1.6 m), weight 79.8 kg (176 lb), last menstrual period 10/10/2016, SpO2 98 %.  GENERAL:  31 y.o.-year-old patient lying in the bed On ventilator  EYES: Pupils equal, round, reactive to light and accommodation. No scleral icterus. Extraocular muscles intact.  HEENT: Head atraumatic, normocephalic. Oropharynx and nasopharynx clear.  NECK:  Supple, no jugular venous distention. No thyroid enlargement, no tenderness.  LUNGS: Decreased breath sounds bilaterally, rales heard in the left lung.  CARDIOVASCULAR: S1, S2 normal. No murmurs, rubs, or gallops.  ABDOMEN: Soft, nontender,  nondistended. Bowel sounds present. No organomegaly or mass.  EXTREMITIES: No pedal edema, cyanosis, or clubbing.  NEUROLOGIC: Not oriented to time, place and person. On ventilator. Gait not checked.  Complete exam not possible. PSYCHIATRIC: could not be assessed SKIN: No obvious rash, lesion, or ulcer.   LABORATORY PANEL:   CBC  Recent Labs Lab 11/05/16 2050  WBC 27.4*  HGB 13.1  HCT 39.0  PLT 216  MCV 98.4  MCH 33.2  MCHC 33.7  RDW 13.6  LYMPHSABS 4.1*  MONOABS 1.0*  EOSABS 0.0  BASOSABS 0.0   ------------------------------------------------------------------------------------------------------------------  Chemistries   Recent Labs Lab 11/05/16 2050  NA 138  K 4.1  CL 98*  CO2 31  GLUCOSE 109*  BUN 16  CREATININE 0.94  CALCIUM 9.2  AST 31  ALT 18  ALKPHOS 67  BILITOT 0.6   ------------------------------------------------------------------------------------------------------------------ estimated creatinine  clearance is 86 mL/min (by C-G formula based on SCr of 0.94 mg/dL). ------------------------------------------------------------------------------------------------------------------ No results for input(s): TSH, T4TOTAL, T3FREE, THYROIDAB in the last 72 hours.  Invalid input(s): FREET3   Coagulation profile No results for input(s): INR, PROTIME in the last 168 hours. ------------------------------------------------------------------------------------------------------------------- No results for input(s): DDIMER in the last 72 hours. -------------------------------------------------------------------------------------------------------------------  Cardiac Enzymes  Recent Labs Lab 11/05/16 2050  TROPONINI 0.18*   ------------------------------------------------------------------------------------------------------------------ Invalid input(s):  POCBNP  ---------------------------------------------------------------------------------------------------------------  Urinalysis    Component Value Date/Time   COLORURINE AMBER (A) 11/05/2016 2241   APPEARANCEUR TURBID (A) 11/05/2016 2241   LABSPEC 1.025 11/05/2016 2241   PHURINE 5.0 11/05/2016 2241   GLUCOSEU NEGATIVE 11/05/2016 2241   HGBUR NEGATIVE 11/05/2016 2241   BILIRUBINUR NEGATIVE 11/05/2016 2241   KETONESUR NEGATIVE 11/05/2016 2241   PROTEINUR 100 (A) 11/05/2016 2241   NITRITE NEGATIVE 11/05/2016 2241   LEUKOCYTESUR NEGATIVE 11/05/2016 2241     RADIOLOGY: Dg Chest 2 View  Result Date: 11/04/2016 CLINICAL DATA:  Shortness of breath.  Difficulty breathing . EXAM: CHEST  2 VIEW COMPARISON:  10/24/2016 . FINDINGS: Mediastinum hilar structures normal. Heart size normal. Diffuse left lung infiltrate. No pleural effusion or pneumothorax. No acute bony abnormality. Cervical spine fusion . IMPRESSION: Diffuse left lung infiltrate consistent with pneumonia . Electronically Signed   By: Maisie Fus  Register   On: 11/04/2016 10:46   Dg Abdomen 1 View  Result Date: 11/05/2016 CLINICAL DATA:  Orogastric tube placement EXAM: ABDOMEN - 1 VIEW COMPARISON:  02/24/2015 lumbar spine radiographs FINDINGS: The tip and side port of a gastric tube is seen below the left hemidiaphragm in the expected location of the stomach. Patchy airspace opacities are noted of the visualized left mid and lower consistent pneumonia. Cardiomegaly is seen. Probable small left effusion. IMPRESSION: Gastric tube in the expected location the stomach. Left mid and lower lung airspace disease consistent pneumonia with probable small left effusion. Electronically Signed   By: Tollie Eth M.D.   On: 11/05/2016 22:14   Dg Chest Portable 1 View  Result Date: 11/05/2016 CLINICAL DATA:  Increasing dyspnea this evening. History of COPD and CHF. Cough with clear sputum. EXAM: PORTABLE CHEST 1 VIEW COMPARISON:  11/05/2014 CXR  FINDINGS: Borderline cardiomegaly. Low-lying endotracheal tube tip 2.1 cm above the carina. Pullback 1 cm suggested. ACDF of the visualized cervical spine. Gastric tube is seen coiled beneath the left hemidiaphragm in the expected location of the stomach. Patchy confluent airspace opacities are noted in the left mid and lower lung consistent with pneumonia. Superimposed CHF is seen. No pneumothorax or significant pleural fluid. No acute nor suspicious osseous abnormality. IMPRESSION: 1. Left mid and lower lung airspace opacities suspicious for pneumonia. Superimposed CHF also suspected. 2. Low-lying endotracheal tube approximately 2.1 cm above the carina. Pullback 1 cm suggested. 3. Gastric tube in place within the expected location of the stomach. Electronically Signed   By: Tollie Eth M.D.   On: 11/05/2016 22:12    EKG: Orders placed or performed during the hospital encounter of 11/05/16  . ED EKG 12-Lead  . ED EKG 12-Lead    IMPRESSION AND PLAN: 31 year old female patient with history of COPD, degenerative joint disease, tobacco abuse, congestive heart failure, bipolar disorder presented to the emergency room with increased shortness of breath and confusion. Admitting diagnosis 1. Acute hypoxic respiratory failure 2. Left lung pneumonia 3. Altered mental status 4. Elevated troponin 5. Congestive heart failure 6. Emphysema Treatment plan Admit patient to ICU Start patient on IV vancomycin  and IV Levaquin antibiotics Continue mechanical ventilation Keep patient nothing by mouth Orogastric tube IV fluid hydration IV propofol drip for sedation Elevated troponin could be secondary to demand ischemia will cycle troponin DVT prophylaxis subcutaneous Lovenox Case discussed with intensivist on call Transfer of care to intensivist service and further care according to the ICU team.  All the records are reviewed and case discussed with ED provider. Management plans discussed with the  patient, family and they are in agreement.  CODE STATUS:FULL CODE    Code Status Orders        Start     Ordered   11/05/16 2341  Full code  Continuous     11/05/16 2340    Code Status History    Date Active Date Inactive Code Status Order ID Comments User Context   This patient has a current code status but no historical code status.       TOTAL CRITICAL CARE TIME TAKING CARE OF THIS PATIENT: 55 minutes.    Ihor Austin M.D on 11/05/2016 at 11:48 PM  Between 7am to 6pm - Pager - [phone]  After 6pm go to [email or web address] - password [email or web address] [facility]  [place] [facility]  Office  [phone]  CC: Primary care physician; Carlean Jews, NP

## 2016-11-05 NOTE — Progress Notes (Signed)
Patient was initially placed on bipap 12/8 RR 12 100%.  Patient with low sats as well as difficult to arouse.  ED physician made decision to intubate this patient.  See flowsheet for vent information. Patient tolerated procedure well

## 2016-11-05 NOTE — ED Provider Notes (Signed)
[facility] Emergency Department Provider Note  Time seen: 9:10 PM  I have reviewed the triage vital signs and the nursing notes.   HISTORY  Chief Complaint Shortness of Breath    HPI Jennifer Vasquez is a 31 y.o. female with a past medical history of asthma, bipolar, CHF, COPD, hepatitis C, presents to the emergency department for difficulty breathing. According to the patient she has had a fever with cough congestion over the past 4 days. Denies any chest pain, abdominal pain, nausea, vomiting, diarrha.  EMS states upon arrival patient was having significant trouble breathing frothing at the mouth with what appeared to be pulmonary edema. Patient was immediately started on CPAP. EMS states initial CPAP O2 saturation of 70%. This increased to proximal 90% prior to arrival to the emergent department. Patient was given a breathing treatment of DuoNeb, albuterol, 125 mg of Solu-Medrol. Upon arrival patient is in moderate respiratory distress. Patient satting 95% on CPAP but appears to be quite fatigued. Patient falling asleep at times. Patient is able to answer questions however is in moderate respiratory distress.  Past Medical History:  Diagnosis Date  . Acute carpal tunnel syndrome 10/10/2014  . Anxiety state 09/03/2009  . Asthma   . Bipolar disorder (HCC) 02/09/2013  . CHF (congestive heart failure) (HCC)   . COPD (chronic obstructive pulmonary disease) (HCC)   . DDD (degenerative disc disease), cervical 11/13/2014  . DDD (degenerative disc disease), lumbar 11/13/2014  . Hepatitis C   . Lumbosacral radiculopathy 11/13/2014  . Tobacco use disorder 09/06/2012    Patient Active Problem List   Diagnosis Date Noted  . DDD (degenerative disc disease), lumbar 11/13/2014  . DDD (degenerative disc disease), cervical 11/13/2014  . Lumbosacral radiculopathy 11/13/2014  . Acute carpal tunnel syndrome 10/10/2014  . Chronic pain 03/02/2013  . Depression 03/02/2013  . Bipolar  disorder (HCC) 02/09/2013  . Hepatitis C 12/18/2012  . Asthma 11/23/2012  . Neck pain 11/23/2012  . Torticollis 11/23/2012  . Tobacco use disorder 09/06/2012  . Anxiety state 09/03/2009    Past Surgical History:  Procedure Laterality Date  . CERVICAL DISC ARTHROPLASTY    . TONSILLECTOMY    . TUBAL LIGATION      Prior to Admission medications   Medication Sig Start Date End Date Taking? Authorizing Provider  albuterol (PROVENTIL HFA;VENTOLIN HFA) 108 (90 Base) MCG/ACT inhaler Inhale 1 puff into the lungs every 6 (six) hours as needed for wheezing or shortness of breath.    Historical Provider, MD  albuterol-ipratropium (COMBIVENT) 18-103 MCG/ACT inhaler Inhale 2 puffs into the lungs 3 (three) times daily.    Historical Provider, MD  buPROPion (WELLBUTRIN SR) 100 MG 12 hr tablet Take 100 mg by mouth 2 (two) times daily.     Historical Provider, MD  cyclobenzaprine (FLEXERIL) 10 MG tablet  06/04/16   Historical Provider, MD  fluticasone Aleda Grana) 50 MCG/ACT nasal spray  06/11/16   Historical Provider, MD  Fluticasone-Salmeterol (ADVAIR DISKUS) 250-50 MCG/DOSE AEPB 1 inhalation 2 (two) times daily. 04/01/14   Historical Provider, MD  furosemide (LASIX) 20 MG tablet Take 1 tablet (20 mg total) by mouth daily. 10/25/16 10/25/17  Rebecka Apley, MD  HYDROcodone-acetaminophen (NORCO) 10-325 MG tablet Take 1 tablet by mouth every 6 (six) hours as needed.    Historical Provider, MD  ibuprofen (ADVIL,MOTRIN) 800 MG tablet Take 800 mg by mouth every 6 (six) hours as needed for fever or mild pain.     Historical Provider, MD  levothyroxine (SYNTHROID, LEVOTHROID) 25 MCG tablet Take 25 mcg by mouth daily before breakfast.    Historical Provider, MD  naproxen (NAPROSYN) 500 MG tablet Take 1 tablet (500 mg total) by mouth 2 (two) times daily with a meal. Patient not taking: Reported on 07/06/2016 12/17/14   Jene Every, MD  ondansetron (ZOFRAN ODT) 4 MG disintegrating tablet Take 1 tablet (4 mg total)  by mouth every 8 (eight) hours as needed for nausea or vomiting. 10/19/16   Midge Minium, MD  ondansetron (ZOFRAN) 4 MG tablet Take 1 tablet (4 mg total) by mouth daily as needed for nausea or vomiting. 04/23/16   Jene Every, MD  predniSONE (DELTASONE) 20 MG tablet Take 3 tablets (60 mg total) by mouth daily. 10/25/16   Rebecka Apley, MD  traZODone (DESYREL) 50 MG tablet Take 50 mg by mouth at bedtime as needed for sleep.    Historical Provider, MD    Allergies  Allergen Reactions  . Acetaminophen     Other reaction(s): Other (qualifier value) Due to hep c  . Amoxicillin-Pot Clavulanate Diarrhea and Nausea And Vomiting  . Gabapentin Hives  . Tetanus Toxoids   . Tramadol Hives  . Shellfish Allergy Rash    oysters    Family History  Problem Relation Age of Onset  . Arthritis Mother   . Asthma Mother   . Diabetes Mother   . Hyperlipidemia Mother   . Vision loss Mother   . Alcohol abuse Father   . Asthma Father   . Diabetes Father   . Heart disease Father   . Depression Father   . COPD Father   . Cancer Father   . Hypertension Father   . Kidney disease Father   . Vision loss Father     Social History Social History  Substance Use Topics  . Smoking status: Current Some Day Smoker    Packs/day: 5.00    Types: Cigarettes  . Smokeless tobacco: Never Used  . Alcohol use No    Review of Systems Constitutional: Positive for fever 4 days per patient. ENT: Positive for congestion. Cardiovascular: Negative for chest pain. Respiratory:  Significant shortness of breath. Positive for cough Gastrointestinal: Negative for abdominal pain, vomiting and diarrhea. Genitourinary: Negative for dysuria. Musculoskeletal: Negative for back pain. Neurological: Negative for headache All other ROS negative  ____________________________________________   PHYSICAL EXAM:  VITAL SIGNS: ED Triage Vitals  Enc Vitals Group     BP 11/05/16 2049 (!) 143/76     Pulse Rate 11/05/16  2049 (!) 104     Resp 11/05/16 2049 (!) 32     Temp 11/05/16 2049 98.1 F (36.7 C)     Temp Source 11/05/16 2049 Oral     SpO2 11/05/16 2049 97 %     Weight 11/05/16 2052 176 lb (79.8 kg)     Height 11/05/16 2052 5\' 3"  (1.6 m)     Head Circumference --      Peak Flow --      Pain Score 11/05/16 2049 7     Pain Loc --      Pain Edu? --      Excl. in GC? --     Constitutional: Alert. Patient is in moderate respiratory distress at this time. Satting 90-95% on CPAP. Eyes: Normal exam ENT   Head: Normocephalic and atraumatic.   Mouth/Throat:  Dry mucous membranes. Cardiovascular: Normal rate, regular rhythm. No murmur Respiratory:  Moderate respiratory effort. Patient falling asleep at times appears very  fatigued, quite tachypnea with bilateral wheeze and rhonchi. Gastrointestinal: Soft and nontender. No distention.   Musculoskeletal: Nontender with normal range of motion in all extremities. No lower extremity tenderness or edema. Neurologic:  Normal speech and language. No gross focal neurologic deficits  Skin:  Skin is warm, dry and intact.  Psychiatric: Mood and affect are normal.   ____________________________________________   RADIOLOGY  IMPRESSION: 1. Left mid and lower lung airspace opacities suspicious for pneumonia. Superimposed CHF also suspected. 2. Low-lying endotracheal tube approximately 2.1 cm above the carina. Pullback 1 cm suggested. 3. Gastric tube in place within the expected location of the stomach.  ____________________________________________   INITIAL IMPRESSION / ASSESSMENT AND PLAN / ED COURSE  Pertinent labs & imaging results that were available during my care of the patient were reviewed by me and considered in my medical decision making (see chart for details).  Patient presents in moderate respiratory distress on CPAP. Patient appears very fatigued falling asleep at times. Patient initially able to answer questions however became  extremely somnolent within minutes of arrival. Changes in the BiPAP patient continues to appear somnolent, was satting 100% however is now falling asleep unable to take deep breaths and is currently satting 83% on 100% oxygen on BiPAP. At this time I made the decision to intubate the patient. Intubation performed by myself with 2 attempts due to what appears to be some edema of the airway, with thick secretions. On second attempt airway obtained successfully. Patient satting 99% after intubation.  INTUBATION Performed by: Minna Antis  Required items: required blood products, implants, devices, and special equipment available Patient identity confirmed: provided demographic data and hospital-assigned identification number Time out: Immediately prior to procedure a "time out" was called to verify the correct patient, procedure, equipment, support staff and site/side marked as required.  Indications: Respiratory failure   Intubation method: 4.0 Glidescope Laryngoscopy   Preoxygenation: 100% BVM  Sedatives: 100mg  Etomidate Paralytic: 20mg  Succinylcholine  Tube Size: 7.5 cuffed  Post-procedure assessment: chest rise and ETCO2 monitor Breath sounds: equal and absent over the epigastrium Tube secured with: ETT holder Chest x-ray interpreted by radiologist and me.  Chest x-ray findings: endotracheal tube in appropriate position, 2.1 cm above carina.   Patient tolerated the procedure well with no immediate complications.   ----------------------------------------- 10:33 PM on 11/05/2016 -----------------------------------------  Chest x-ray has resulted showing left lower and mid airspace opacity suspicious for pneumonia also superimposed CHF. Patient's white blood cell count was significantly elevated at 27,000. Given these findings have initiated sepsis protocols for IV antibiotics. However given the patient's likely CHF exacerbation with initial pulmonary edema reported by EMS in  respiratory failure likely due to a combination of CHF/pulmonary edema and pneumonia we will hold off on significant IV fluids at this time. We will dose Lasix.   CRITICAL CARE Performed by:   Total critical care time: 90 minutes  Critical care time was exclusive of separately billable procedures and treating other patients.  Critical care was necessary to treat or prevent imminent or life-threatening deterioration.  Critical care was time spent personally by me on the following activities: development of treatment plan with patient and/or surrogate as well as nursing, discussions with consultants, evaluation of patient's response to treatment, examination of patient, obtaining history from patient or surrogate, ordering and performing treatments and interventions, ordering and review of laboratory studies, ordering and review of radiographic studies, pulse oximetry and re-evaluation of patient's condition.   EKG reviewed and interpreted by myself shows  sinus tachycardia 119 bpm, narrow QRS, normal axis, normal intervals, no concerning ST changes.  Patient requiring multiple medications to keep her sedated they ventilated. Currently on propofol drip. Patient received IV Versed times multiple boluses as well as fentanyl. I have started the patient on a Versed drip. She did require vecuronium to keep from bucking the vent. Prior to vecuronium I ensured the patient was unconscious, did not respond to any stimuli.  Patient's labs show significant leukocytosis otherwise largely negative.   Troponin has resulted elevated 0.18. Patient admitted to the hospital for further workup.    ____________________________________________   FINAL CLINICAL IMPRESSION(S) / ED DIAGNOSES  Sepsis Pneumonia Respiratory failure    Minna Antis, MD 11/05/16 2352

## 2016-11-05 NOTE — Progress Notes (Addendum)
Pharmacy Antibiotic Note  Jennifer Vasquez is a 31 y.o. female admitted on 11/05/2016 with Respiratory distress/PNA on CXR.  Pharmacy has been consulted for vanc/levaquin/zosyn dosing.  Plan: Patient received vanc 1g IV x 1 in the ED  Will follow-up with a vanc 1.25g IV q8h maintenance w/ 6 hour stack dose. Will initiate levaquin 750 mg IV daily  Ke 0.077 t1/2 9 hours ~ 8 hours Will check a vanc trough 4/29 @ 0300 prior to 4th dose. Zosyn 3.375g IV q8h  Height: 5\' 3"  (160 cm) Weight: 176 lb (79.8 kg) IBW/kg (Calculated) : 52.4  Temp (24hrs), Avg:98.1 F (36.7 C), Min:98.1 F (36.7 C), Max:98.1 F (36.7 C)   Recent Labs Lab 11/05/16 2050  WBC 27.4*  LATICACIDVEN 1.4    Estimated Creatinine Clearance: 87.9 mL/min (by C-G formula based on SCr of 0.92 mg/dL).    Allergies  Allergen Reactions  . Acetaminophen     Other reaction(s): Other (qualifier value) Due to hep c  . Amoxicillin-Pot Clavulanate Diarrhea and Nausea And Vomiting  . Gabapentin Hives  . Tetanus Toxoids   . Tramadol Hives  . Shellfish Allergy Rash    oysters    Thank you for allowing pharmacy to be a part of this patient's care.  11/07/16, PharmD, BCPS Clinical Pharmacist 11/05/2016

## 2016-11-05 NOTE — ED Notes (Signed)
Patient unable to stay awake. O2 dropping into 80's. MD still at bedside.

## 2016-11-06 ENCOUNTER — Inpatient Hospital Stay
Admit: 2016-11-06 | Discharge: 2016-11-06 | Disposition: A | Discharge status: Home / Self Care | Payer: BLUE CROSS/BLUE SHIELD | Attending: Adult Health | Admitting: Adult Health

## 2016-11-06 ENCOUNTER — Inpatient Hospital Stay: Payer: BLUE CROSS/BLUE SHIELD

## 2016-11-06 DIAGNOSIS — G92 Toxic encephalopathy: Secondary | ICD-10-CM

## 2016-11-06 LAB — COMPREHENSIVE METABOLIC PANEL
ALK PHOS: 65 U/L (ref 38–126)
ALT: 17 U/L (ref 14–54)
ANION GAP: 8 (ref 5–15)
AST: 25 U/L (ref 15–41)
Albumin: 3.1 g/dL — ABNORMAL LOW (ref 3.5–5.0)
BUN: 11 mg/dL (ref 6–20)
CALCIUM: 8.1 mg/dL — AB (ref 8.9–10.3)
CO2: 30 mmol/L (ref 22–32)
CREATININE: 0.69 mg/dL (ref 0.44–1.00)
Chloride: 98 mmol/L — ABNORMAL LOW (ref 101–111)
Glucose, Bld: 146 mg/dL — ABNORMAL HIGH (ref 65–99)
Potassium: 4 mmol/L (ref 3.5–5.1)
Sodium: 136 mmol/L (ref 135–145)
TOTAL PROTEIN: 6.6 g/dL (ref 6.5–8.1)
Total Bilirubin: 0.7 mg/dL (ref 0.3–1.2)

## 2016-11-06 LAB — URINE DRUG SCREEN, QUALITATIVE (ARMC ONLY)
Amphetamines, Ur Screen: NOT DETECTED
BARBITURATES, UR SCREEN: NOT DETECTED
Benzodiazepine, Ur Scrn: POSITIVE — AB
Cannabinoid 50 Ng, Ur ~~LOC~~: NOT DETECTED
Cocaine Metabolite,Ur ~~LOC~~: POSITIVE — AB
MDMA (Ecstasy)Ur Screen: NOT DETECTED
METHADONE SCREEN, URINE: POSITIVE — AB
OPIATE, UR SCREEN: POSITIVE — AB
Phencyclidine (PCP) Ur S: NOT DETECTED
TRICYCLIC, UR SCREEN: POSITIVE — AB

## 2016-11-06 LAB — TROPONIN I
TROPONIN I: 0.06 ng/mL — AB (ref <0.03)
Troponin I: 0.04 ng/mL (ref <0.03)
Troponin I: 0.03 ng/mL (ref <0.03)

## 2016-11-06 LAB — CBC
HCT: 33.5 % — ABNORMAL LOW (ref 35.0–47.0)
Hemoglobin: 11.6 g/dL — ABNORMAL LOW (ref 12.0–16.0)
MCH: 33.3 pg (ref 26.0–34.0)
MCHC: 34.6 g/dL (ref 32.0–36.0)
MCV: 96.2 fL (ref 80.0–100.0)
PLATELETS: 203 10*3/uL (ref 150–440)
RBC: 3.48 MIL/uL — AB (ref 3.80–5.20)
RDW: 13.4 % (ref 11.5–14.5)
WBC: 25.1 10*3/uL — AB (ref 3.6–11.0)

## 2016-11-06 LAB — GLUCOSE, CAPILLARY
GLUCOSE-CAPILLARY: 253 mg/dL — AB (ref 65–99)
Glucose-Capillary: 128 mg/dL — ABNORMAL HIGH (ref 65–99)

## 2016-11-06 LAB — MRSA PCR SCREENING: MRSA by PCR: NEGATIVE

## 2016-11-06 LAB — INFLUENZA PANEL BY PCR (TYPE A & B)
INFLBPCR: POSITIVE — AB
Influenza A By PCR: NEGATIVE

## 2016-11-06 LAB — LACTIC ACID, PLASMA
LACTIC ACID, VENOUS: 2.4 mmol/L — AB (ref 0.5–1.9)
Lactic Acid, Venous: 2.2 mmol/L (ref 0.5–1.9)

## 2016-11-06 LAB — MAGNESIUM: Magnesium: 1.5 mg/dL — ABNORMAL LOW (ref 1.7–2.4)

## 2016-11-06 LAB — PHOSPHORUS: Phosphorus: 3.3 mg/dL (ref 2.5–4.6)

## 2016-11-06 LAB — TRIGLYCERIDES: Triglycerides: 203 mg/dL — ABNORMAL HIGH (ref <150)

## 2016-11-06 MED ORDER — PANTOPRAZOLE SODIUM 40 MG IV SOLR: 40.0000 mg | INTRAVENOUS | Status: DC

## 2016-11-06 MED ORDER — FAMOTIDINE IN NACL 20-0.9 MG/50ML-% IV SOLN
20.0000 mg | Freq: Two times a day (BID) | INTRAVENOUS | Status: DC
  Administered 2016-11-06 – 2016-11-07 (×4): 20 mg via INTRAVENOUS
  Filled 2016-11-06 (×4): qty 50

## 2016-11-06 MED ORDER — PIPERACILLIN-TAZOBACTAM 3.375 G IVPB
3.3750 g | Freq: Three times a day (TID) | INTRAVENOUS | Status: DC
  Administered 2016-11-06 – 2016-11-08 (×7): 3.375 g via INTRAVENOUS
  Filled 2016-11-06 (×9): qty 50

## 2016-11-06 MED ORDER — IPRATROPIUM-ALBUTEROL 0.5-2.5 (3) MG/3ML IN SOLN
3.0000 mL | Freq: Four times a day (QID) | RESPIRATORY_TRACT | Status: DC
  Administered 2016-11-06 – 2016-11-15 (×36): 3 mL via RESPIRATORY_TRACT
  Filled 2016-11-06 (×39): qty 3

## 2016-11-06 MED ORDER — PANTOPRAZOLE SODIUM 40 MG IV SOLR
40.0000 mg | INTRAVENOUS | Status: DC
  Administered 2016-11-06 – 2016-11-07 (×2): 40 mg via INTRAVENOUS
  Filled 2016-11-06 (×2): qty 40

## 2016-11-06 MED ORDER — DOCUSATE SODIUM 50 MG/5ML PO LIQD
100.0000 mg | Freq: Two times a day (BID) | ORAL | Status: DC | PRN
Start: 2016-11-06 — End: 2016-11-13
  Administered 2016-11-08: 100 mg
  Filled 2016-11-06: qty 10

## 2016-11-06 MED ORDER — PROPOFOL 1000 MG/100ML IV EMUL
5.0000 ug/kg/min | INTRAVENOUS | Status: DC
  Administered 2016-11-06: 60 ug/kg/min via INTRAVENOUS
  Administered 2016-11-06 (×2): 70 ug/kg/min via INTRAVENOUS
  Administered 2016-11-06: 50 ug/kg/min via INTRAVENOUS
  Administered 2016-11-06: 70 ug/kg/min via INTRAVENOUS
  Filled 2016-11-06 (×6): qty 100

## 2016-11-06 MED ORDER — DEXMEDETOMIDINE HCL IN NACL 400 MCG/100ML IV SOLN
0.0000 ug/kg/h | INTRAVENOUS | Status: DC
  Administered 2016-11-06 (×2): 1 ug/kg/h via INTRAVENOUS
  Administered 2016-11-06: 1.2 ug/kg/h via INTRAVENOUS
  Administered 2016-11-07 – 2016-11-08 (×12): 2 ug/kg/h via INTRAVENOUS
  Filled 2016-11-06 (×20): qty 100

## 2016-11-06 MED ORDER — PRO-STAT SUGAR FREE PO LIQD
30.0000 mL | Freq: Two times a day (BID) | ORAL | Status: DC
  Administered 2016-11-07 (×2): 30 mL

## 2016-11-06 MED ORDER — VITAL HIGH PROTEIN PO LIQD
1000.0000 mL | ORAL | Status: DC
  Administered 2016-11-07: 04:00:00
  Administered 2016-11-07: 1000 mL
  Administered 2016-11-07 (×2)

## 2016-11-06 MED ORDER — FENTANYL BOLUS VIA INFUSION
50.0000 ug | INTRAVENOUS | Status: DC | PRN
  Administered 2016-11-06 (×3): 50 ug via INTRAVENOUS
  Filled 2016-11-06: qty 50

## 2016-11-06 MED ORDER — MAGNESIUM SULFATE 4 GM/100ML IV SOLN
4.0000 g | Freq: Once | INTRAVENOUS | Status: AC
  Administered 2016-11-06: 4 g via INTRAVENOUS
  Filled 2016-11-06: qty 100

## 2016-11-06 MED ORDER — ROCURONIUM BROMIDE 50 MG/5ML IV SOLN
100.0000 mg | Freq: Once | INTRAVENOUS | Status: DC
  Administered 2016-11-06: 100 mg via INTRAVENOUS

## 2016-11-06 MED ORDER — FENTANYL 2500MCG IN NS 250ML (10MCG/ML) PREMIX INFUSION
0.0000 ug/h | INTRAVENOUS | Status: DC
  Administered 2016-11-06: 50 ug/h via INTRAVENOUS
  Administered 2016-11-06: 250 ug/h via INTRAVENOUS
  Administered 2016-11-07 – 2016-11-08 (×8): 400 ug/h via INTRAVENOUS
  Administered 2016-11-09: 350 ug/h via INTRAVENOUS
  Administered 2016-11-09: 100 ug/h via INTRAVENOUS
  Administered 2016-11-09: 375 ug/h via INTRAVENOUS
  Administered 2016-11-09 (×2): 50 ug/h via INTRAVENOUS
  Administered 2016-11-09 – 2016-11-10 (×2): 350 ug/h via INTRAVENOUS
  Administered 2016-11-10 (×2): 300 ug/h via INTRAVENOUS
  Administered 2016-11-11: 200 ug/h via INTRAVENOUS
  Administered 2016-11-11: 300 ug/h via INTRAVENOUS
  Administered 2016-11-12: 150 ug/h via INTRAVENOUS
  Administered 2016-11-12: 10 ug/h via INTRAVENOUS
  Filled 2016-11-06 (×21): qty 250

## 2016-11-06 MED ORDER — FENTANYL CITRATE (PF) 100 MCG/2ML IJ SOLN
50.0000 ug | Freq: Once | INTRAMUSCULAR | Status: AC
  Administered 2016-11-06: 50 ug via INTRAVENOUS

## 2016-11-06 MED ORDER — OSELTAMIVIR PHOSPHATE 6 MG/ML PO SUSR
75.0000 mg | Freq: Two times a day (BID) | ORAL | Status: AC
  Administered 2016-11-06 – 2016-11-10 (×10): 75 mg
  Filled 2016-11-06 (×13): qty 12.5

## 2016-11-06 MED ORDER — SENNOSIDES 8.8 MG/5ML PO SYRP
10.0000 mL | ORAL_SOLUTION | Freq: Every evening | ORAL | Status: DC | PRN
  Filled 2016-11-06: qty 10

## 2016-11-06 NOTE — ED Notes (Signed)
Attempted to take patient to ICU, this RN, Carolan Shiver RN and Taver[place], RT had to hold patient down in hallway.  Pt attempting to fight staff and cough up ET tube.  Pt put back in room and VO received for rocuronium 100mg  IV push by Dr. .

## 2016-11-06 NOTE — Consult Note (Signed)
PULMONARY / CRITICAL CARE MEDICINE   Name: Jennifer Vasquez MRN: [id number] DOB: 01-08-1986    ADMISSION DATE:  11/05/2016   CONSULTATION DATE:  11/06/2016  REASON FOR CONSULT: ACUTE RESPIRATORY FAILURE  REFERRING MD:  Dr. Tobi Bastos  CHIEF COMPLAINT:  Shortness of breath  HISTORY OF PRESENT ILLNESS:   This is a 31 y/o caucasian female with a PMH of polysubstance abuse, bipolar disorder, questionable CHF, asthma/COPD, hepatitis C and chronic pain who presented to the ED with complaints of worsening dyspnea, fever and productive cough x 4 days. History is obtained from ED/EMS records as patient currently intubated and sedated. Per EMS, patient was having significant difficulty breathing and was "foaming at the mouth". EMS placed her on CPAP, administered solumedrol and duoneb. Initial SPO2 was in the 70s and increased to the 90s with CPAP/nebs/setroids. At the ED, patient was lethargic, wheezing and subsequently became hypoxic with SPO2 in the 80s. She was  emergently intubated. PCCM was consulted for further management Her tox screen is positive for benzodiazepine, cocaine, methadone, opiate and tricyclics. Her WBC was 27K and her troponin was mildly elevated at 0.18  Per EMR, patient was being treated for Hep C with Harvoni  PAST MEDICAL HISTORY :  She  has a past medical history of Acute carpal tunnel syndrome (10/10/2014); Anxiety state (09/03/2009); Asthma; Bipolar disorder (HCC) (02/09/2013); CHF (congestive heart failure) (HCC); COPD (chronic obstructive pulmonary disease) (HCC); DDD (degenerative disc disease), cervical (11/13/2014); DDD (degenerative disc disease), lumbar (11/13/2014); Hepatitis C; Lumbosacral radiculopathy (11/13/2014); and Tobacco use disorder (09/06/2012).  PAST SURGICAL HISTORY: She  has a past surgical history that includes Tonsillectomy; Tubal ligation; and Cervical disc arthroplasty.  Allergies  Allergen Reactions  . Acetaminophen     Other reaction(s): Other (qualifier  value) Due to hep c  . Amoxicillin-Pot Clavulanate Diarrhea and Nausea And Vomiting  . Gabapentin Hives  . Tetanus Toxoids   . Tramadol Hives  . Shellfish Allergy Rash    oysters    No current facility-administered medications on file prior to encounter.    Current Outpatient Prescriptions on File Prior to Encounter  Medication Sig  . albuterol (PROVENTIL HFA;VENTOLIN HFA) 108 (90 Base) MCG/ACT inhaler Inhale 1 puff into the lungs every 6 (six) hours as needed for wheezing or shortness of breath.  Marland Kitchen albuterol-ipratropium (COMBIVENT) 18-103 MCG/ACT inhaler Inhale 2 puffs into the lungs 3 (three) times daily.  Marland Kitchen buPROPion (WELLBUTRIN SR) 100 MG 12 hr tablet Take 100 mg by mouth 2 (two) times daily.   . cyclobenzaprine (FLEXERIL) 10 MG tablet   . fluticasone (FLONASE) 50 MCG/ACT nasal spray   . Fluticasone-Salmeterol (ADVAIR DISKUS) 250-50 MCG/DOSE AEPB 1 inhalation 2 (two) times daily.  . furosemide (LASIX) 20 MG tablet Take 1 tablet (20 mg total) by mouth daily.  Marland Kitchen HYDROcodone-acetaminophen (NORCO) 10-325 MG tablet Take 1 tablet by mouth every 6 (six) hours as needed.  Marland Kitchen ibuprofen (ADVIL,MOTRIN) 800 MG tablet Take 800 mg by mouth every 6 (six) hours as needed for fever or mild pain.   Marland Kitchen levothyroxine (SYNTHROID, LEVOTHROID) 25 MCG tablet Take 25 mcg by mouth daily before breakfast.  . naproxen (NAPROSYN) 500 MG tablet Take 1 tablet (500 mg total) by mouth 2 (two) times daily with a meal. (Patient not taking: Reported on 07/06/2016)  . ondansetron (ZOFRAN ODT) 4 MG disintegrating tablet Take 1 tablet (4 mg total) by mouth every 8 (eight) hours as needed for nausea or vomiting.  . ondansetron (ZOFRAN) 4 MG tablet Take 1  tablet (4 mg total) by mouth daily as needed for nausea or vomiting.  . predniSONE (DELTASONE) 20 MG tablet Take 3 tablets (60 mg total) by mouth daily.  . traZODone (DESYREL) 50 MG tablet Take 50 mg by mouth at bedtime as needed for sleep.    FAMILY HISTORY:  Her  indicated that her mother is alive. She indicated that her father is alive.    SOCIAL HISTORY: She  reports that she has been smoking Cigarettes.  She has been smoking about 5.00 packs per day. She has never used smokeless tobacco. She reports that she does not drink alcohol or use drugs.  REVIEW OF SYSTEMS:   Unable to obtain as patient is intubated and sedated  SUBJECTIVE:    VITAL SIGNS: BP 119/62   Pulse 89   Temp 98.3 F (36.8 C)   Resp 20   Ht 5\' 4"  (1.626 m)   Wt 203 lb 4.2 oz (92.2 kg)   LMP 10/10/2016   SpO2 98%   BMI 34.89 kg/m   HEMODYNAMICS:    VENTILATOR SETTINGS: Vent Mode: PRVC FiO2 (%):  [40 %-60 %] 60 % Set Rate:  [20 bmp] 20 bmp Vt Set:  [500 mL] 500 mL PEEP:  [5 cmH20] 5 cmH20 Plateau Pressure:  [24 cmH20] 24 cmH20  INTAKE / OUTPUT: No intake/output data recorded.  PHYSICAL EXAMINATION: General:  NAD, well nourished, well developed, well kempt Neuro: sedated, withdraws to pain HEENT:  ETT, oral mucosa moist and pink, PERRLA, trachea midline. Cardiovascular: RRR, S1/S2, no MRG Lungs: Bilateral breath sounds, coarse crackles in the lower lung fields, no wheezing Abdomen:  Soft, NT/ND, +BS X4 Musculoskeletal:  No joint swelling, no deformities Skin:  Warm and dry  LABS:  BMET  Recent Labs Lab 11/05/16 2050  NA 138  K 4.1  CL 98*  CO2 31  BUN 16  CREATININE 0.94  GLUCOSE 109*    Electrolytes  Recent Labs Lab 11/05/16 2050  CALCIUM 9.2    CBC  Recent Labs Lab 11/05/16 2050  WBC 27.4*  HGB 13.1  HCT 39.0  PLT 216    Coag's No results for input(s): APTT, INR in the last 168 hours.  Sepsis Markers  Recent Labs Lab 11/05/16 2050 11/06/16 0111  LATICACIDVEN 1.4 2.4*    ABG  Recent Labs Lab 11/05/16 2145  PHART 7.38  PCO2ART 49*  PO2ART 214*    Liver Enzymes  Recent Labs Lab 11/05/16 2050  AST 31  ALT 18  ALKPHOS 67  BILITOT 0.6  ALBUMIN 3.6    Cardiac Enzymes  Recent Labs Lab  11/05/16 2050 11/06/16 0111  TROPONINI 0.18* 0.06*    Glucose  Recent Labs Lab 11/06/16 0101  GLUCAP 253*    Imaging Dg Abdomen 1 View  Result Date: 11/05/2016 CLINICAL DATA:  Orogastric tube placement EXAM: ABDOMEN - 1 VIEW COMPARISON:  02/24/2015 lumbar spine radiographs FINDINGS: The tip and side port of a gastric tube is seen below the left hemidiaphragm in the expected location of the stomach. Patchy airspace opacities are noted of the visualized left mid and lower consistent pneumonia. Cardiomegaly is seen. Probable small left effusion. IMPRESSION: Gastric tube in the expected location the stomach. Left mid and lower lung airspace disease consistent pneumonia with probable small left effusion. Electronically Signed   By: 02/26/2015 M.D.   On: 11/05/2016 22:14   Dg Chest Portable 1 View  Result Date: 11/05/2016 CLINICAL DATA:  Increasing dyspnea this evening. History of COPD and CHF.  Cough with clear sputum. EXAM: PORTABLE CHEST 1 VIEW COMPARISON:  11/05/2014 CXR FINDINGS: Borderline cardiomegaly. Low-lying endotracheal tube tip 2.1 cm above the carina. Pullback 1 cm suggested. ACDF of the visualized cervical spine. Gastric tube is seen coiled beneath the left hemidiaphragm in the expected location of the stomach. Patchy confluent airspace opacities are noted in the left mid and lower lung consistent with pneumonia. Superimposed CHF is seen. No pneumothorax or significant pleural fluid. No acute nor suspicious osseous abnormality. IMPRESSION: 1. Left mid and lower lung airspace opacities suspicious for pneumonia. Superimposed CHF also suspected. 2. Low-lying endotracheal tube approximately 2.1 cm above the carina. Pullback 1 cm suggested. 3. Gastric tube in place within the expected location of the stomach. Electronically Signed   By: Tollie Eth M.D.   On: 11/05/2016 22:12    STUDIES:  2-D echo 4/28>  CULTURES: Blkood cultures x 2> Urine culture.  ANTIBIOTICS: Vancomycin  4/27>4/28 Levaquin 4/27>  SIGNIFICANT EVENTS: 4/27>ED with lethargy, hypoxemia and subsequent respiratory failure  LINES/TUBES: ETT 4/27  DISCUSSION: 31 Y/O female presenting with acute respiratory failure likely due to CAP and possible overdose.   ASSESSMENT Acute hypoxic/hypercarbic respiratory failure CAP Sepsis Pulmonary edema Acute  Polysubstance abuse-Possible opiate and cocaine OD H/O Hepatitis C h/o Bipolar disorder  PLAN RASS goal: -1 to -2 Full vent support with current settings CXR and ABG prn Nebulized bronchodilator Gentle fluid resuscitation in light of pulmonary edema and ? h/o CHF Antibiotics as above; D/C vancomycin as MRSA screen is nagative F/U cultures GI/DVT prophylaxis: lovenox and PPI Propofol and versed for vent sedation  FAMILY  - Updates: No family at bedside. Will update once available  - Inter-disciplinary family meet or Palliative Care meeting due by:  day 7  Plan of care discussed with [facility] MD and Dr. Lelon Frohlich. [facility] ANP-BC Pulmonary and Critical Care Medicine [facility] Pager [phone] or [phone] 11/06/2016, 5:08 AM

## 2016-11-06 NOTE — Progress Notes (Signed)
[facility] - [place] at [facility]   PATIENT NAME: Jennifer Vasquez    MR#:  [id number]  DATE OF BIRTH:  06-06-1986  SUBJECTIVE:   Patient here due to shortness of breath and acute respiratory failure with hypoxia secondary to COPD exacerbation with pneumonia. Patient intubated in the ER and currently remains intubated and sedated. No other acute events overnight. Remains on 3 sedative meds.  REVIEW OF SYSTEMS:    Review of Systems  Unable to perform ROS: Intubated    Nutrition: Tube feeds Tolerating Diet: Yes Tolerating PT: Await Eval once extubated.   DRUG ALLERGIES:   Allergies  Allergen Reactions  . Acetaminophen     Other reaction(s): Other (qualifier value) Due to hep c  . Amoxicillin-Pot Clavulanate Diarrhea and Nausea And Vomiting  . Gabapentin Hives  . Tetanus Toxoids   . Tramadol Hives  . Shellfish Allergy Rash    oysters    VITALS:  Blood pressure 118/61, pulse 86, temperature 99 F (37.2 C), temperature source Axillary, resp. rate 20, height 5\' 4"  (1.626 m), weight 92.2 kg (203 lb 4.2 oz), last menstrual period 10/10/2016, SpO2 94 %.  PHYSICAL EXAMINATION:   Physical Exam  GENERAL:  31 y.o.-year-old critically ill patient lying in bed sedated & intubated. EYES: Pupils equal, round, reactive to light. No scleral icterus. Extraocular muscles intact.  HEENT: Head atraumatic, normocephalic. Oropharynx and nasopharynx clear.  NECK:  Supple, no jugular venous distention. No thyroid enlargement, no tenderness.  LUNGS: Normal breath sounds bilaterally, no wheezing, rales, rhonchi. No use of accessory muscles of respiration.  CARDIOVASCULAR: S1, S2 normal. No murmurs, rubs, or gallops.  ABDOMEN: Soft, nontender, nondistended. Bowel sounds present. No organomegaly or mass.  EXTREMITIES: No cyanosis, clubbing or edema b/l.    NEUROLOGIC: Sedated & Intubated  PSYCHIATRIC: Sedated & Intubated.  SKIN: No obvious rash, lesion, or ulcer.     LABORATORY PANEL:   CBC  Recent Labs Lab 11/06/16 0505  WBC 25.1*  HGB 11.6*  HCT 33.5*  PLT 203   ------------------------------------------------------------------------------------------------------------------  Chemistries   Recent Labs Lab 11/06/16 0505 11/06/16 0644  NA 136  --   K 4.0  --   CL 98*  --   CO2 30  --   GLUCOSE 146*  --   BUN 11  --   CREATININE 0.69  --   CALCIUM 8.1*  --   MG  --  1.5*  AST 25  --   ALT 17  --   ALKPHOS 65  --   BILITOT 0.7  --    ------------------------------------------------------------------------------------------------------------------  Cardiac Enzymes  Recent Labs Lab 11/06/16 1128  TROPONINI 0.03*   ------------------------------------------------------------------------------------------------------------------  RADIOLOGY:  Dg Abdomen 1 View  Result Date: 11/05/2016 CLINICAL DATA:  Orogastric tube placement EXAM: ABDOMEN - 1 VIEW COMPARISON:  02/24/2015 lumbar spine radiographs FINDINGS: The tip and side port of a gastric tube is seen below the left hemidiaphragm in the expected location of the stomach. Patchy airspace opacities are noted of the visualized left mid and lower consistent pneumonia. Cardiomegaly is seen. Probable small left effusion. IMPRESSION: Gastric tube in the expected location the stomach. Left mid and lower lung airspace disease consistent pneumonia with probable small left effusion. Electronically Signed   By: 02/26/2015 M.D.   On: 11/05/2016 22:14   Dg Chest Port 1 View  Result Date: 11/06/2016 CLINICAL DATA:  Acute restrained failure EXAM: PORTABLE CHEST 1 VIEW COMPARISON:  11/05/2016 FINDINGS: Endotracheal tube with the tip 1.8 cm  above the carina. Nasogastric tube coursing below the diaphragm. Bilateral interstitial thickening. Patchy airspace disease in the right upper lobe and left lower lobe. No pleural effusion or pneumothorax. Stable cardiomegaly. No acute osseous abnormality.  IMPRESSION: 1. Bilateral interstitial and alveolar airspace opacities with mild cardiomegaly. Differential considerations include pulmonary edema versus multi lobar pneumonia. Electronically Signed   By: Elige Ko   On: 11/06/2016 08:48   Dg Chest Portable 1 View  Result Date: 11/05/2016 CLINICAL DATA:  Increasing dyspnea this evening. History of COPD and CHF. Cough with clear sputum. EXAM: PORTABLE CHEST 1 VIEW COMPARISON:  11/05/2014 CXR FINDINGS: Borderline cardiomegaly. Low-lying endotracheal tube tip 2.1 cm above the carina. Pullback 1 cm suggested. ACDF of the visualized cervical spine. Gastric tube is seen coiled beneath the left hemidiaphragm in the expected location of the stomach. Patchy confluent airspace opacities are noted in the left mid and lower lung consistent with pneumonia. Superimposed CHF is seen. No pneumothorax or significant pleural fluid. No acute nor suspicious osseous abnormality. IMPRESSION: 1. Left mid and lower lung airspace opacities suspicious for pneumonia. Superimposed CHF also suspected. 2. Low-lying endotracheal tube approximately 2.1 cm above the carina. Pullback 1 cm suggested. 3. Gastric tube in place within the expected location of the stomach. Electronically Signed   By: Tollie Eth M.D.   On: 11/05/2016 22:12     ASSESSMENT AND PLAN:   31 year old female with past medical history of COPD, degenerative disc disease, bipolar disorder, hepatitis C, CHF who presented to the hospital due to shortness of breath and noted to be in acute respiratory failure with hypoxia secondary to COPD exacerbation and pneumonia.  1. Acute respiratory failure with hypoxia-secondary to COPD exacerbation with underlying pneumonia. -Now intubated and sedated. Patient is on 40% FiO2 with only 5 of PEEP. -Continue vent support and further care as per intensivist.  2. COPD exacerbation-secondary to underlying pneumonia. -Continue broad-spectrum IV antibiotics with Zosyn, Levaquin,  vancomycin. -Continue empiric Tamiflu as there is some suspicion that patient was diagnosed with flu prior to coming to the hospital. Await influenza PCR. - cont. duonebs.   3. Leukocytosis - due to Pneumonia.  - follow w/ IV abx therapy.   4. Elevated Trop - due to supply demand ischemia from hypoxia.  - trend trop and no plans for further intervention. Possible Echo if not improving.   5. GERD - cont. Pepcid IV  6. Hypomagnesemia - will supplement and repeat level in a.m.    All the records are reviewed and case discussed with Care Management/Social Worker. Management plans discussed with the patient, family and they are in agreement.  CODE STATUS: Full code  DVT Prophylaxis: Lovenox  TOTAL TIME TAKING CARE OF THIS PATIENT: 30 minutes.   POSSIBLE D/C ?? DAYS, DEPENDING ON CLINICAL CONDITION.   Houston Siren M.D on 11/06/2016 at 3:10 PM  Between 7am to 6pm - Pager - [phone]  After 6pm go to [email or web address] - Social research officer, government  [employer] [facility]  Office  [phone]  CC: Primary care physician; Carlean Jews, NP

## 2016-11-06 NOTE — Plan of Care (Signed)
Problem: Safety: Goal: Ability to remain free from injury will improve Outcome: Progressing Pt near nurses station, sedated.

## 2016-11-07 ENCOUNTER — Inpatient Hospital Stay: Payer: BLUE CROSS/BLUE SHIELD

## 2016-11-07 DIAGNOSIS — J189 Pneumonia, unspecified organism: Secondary | ICD-10-CM

## 2016-11-07 DIAGNOSIS — J9602 Acute respiratory failure with hypercapnia: Secondary | ICD-10-CM

## 2016-11-07 LAB — AMMONIA: Ammonia: 27 umol/L (ref 9–35)

## 2016-11-07 LAB — BASIC METABOLIC PANEL
Anion gap: 6 (ref 5–15)
BUN: 11 mg/dL (ref 6–20)
CALCIUM: 7.9 mg/dL — AB (ref 8.9–10.3)
CO2: 25 mmol/L (ref 22–32)
Chloride: 107 mmol/L (ref 101–111)
Creatinine, Ser: 0.71 mg/dL (ref 0.44–1.00)
GFR calc Af Amer: >60 mL/min (ref >60)
GLUCOSE: 138 mg/dL — AB (ref 65–99)
Potassium: 3.7 mmol/L (ref 3.5–5.1)
Sodium: 138 mmol/L (ref 135–145)

## 2016-11-07 LAB — GLUCOSE, CAPILLARY
GLUCOSE-CAPILLARY: 153 mg/dL — AB (ref 65–99)
GLUCOSE-CAPILLARY: 152 mg/dL — AB (ref 65–99)
GLUCOSE-CAPILLARY: 123 mg/dL — AB (ref 65–99)
Glucose-Capillary: 126 mg/dL — ABNORMAL HIGH (ref 65–99)
Glucose-Capillary: 134 mg/dL — ABNORMAL HIGH (ref 65–99)
Glucose-Capillary: 138 mg/dL — ABNORMAL HIGH (ref 65–99)

## 2016-11-07 LAB — CBC
HCT: 31.5 % — ABNORMAL LOW (ref 35.0–47.0)
Hemoglobin: 10.7 g/dL — ABNORMAL LOW (ref 12.0–16.0)
MCH: 33.3 pg (ref 26.0–34.0)
MCHC: 34.1 g/dL (ref 32.0–36.0)
MCV: 97.7 fL (ref 80.0–100.0)
PLATELETS: 213 10*3/uL (ref 150–440)
RBC: 3.22 MIL/uL — ABNORMAL LOW (ref 3.80–5.20)
RDW: 13.4 % (ref 11.5–14.5)
WBC: 14.7 10*3/uL — ABNORMAL HIGH (ref 3.6–11.0)

## 2016-11-07 LAB — ECHOCARDIOGRAM COMPLETE
HEIGHTINCHES: 64 in
Weight: 3252.23 oz

## 2016-11-07 LAB — PHOSPHORUS
PHOSPHORUS: 3.9 mg/dL (ref 2.5–4.6)
Phosphorus: 2.1 mg/dL — ABNORMAL LOW (ref 2.5–4.6)

## 2016-11-07 LAB — URINE CULTURE: Culture: NO GROWTH

## 2016-11-07 LAB — CK: CK TOTAL: 114 U/L (ref 38–234)

## 2016-11-07 LAB — MAGNESIUM
Magnesium: 2.5 mg/dL — ABNORMAL HIGH (ref 1.7–2.4)
Magnesium: 2.1 mg/dL (ref 1.7–2.4)

## 2016-11-07 MED ORDER — VECURONIUM BROMIDE 10 MG IV SOLR
10.0000 mg | INTRAVENOUS | Status: DC | PRN
  Administered 2016-11-07 – 2016-11-08 (×5): 10 mg via INTRAVENOUS
  Filled 2016-11-07 (×5): qty 10

## 2016-11-07 MED ORDER — DEXTROSE 5 % IV SOLN: 10.0000 mmol | Freq: Once | INTRAVENOUS | Status: DC

## 2016-11-07 MED ORDER — FENTANYL BOLUS VIA INFUSION
100.0000 ug | INTRAVENOUS | Status: DC | PRN
  Administered 2016-11-07 – 2016-11-11 (×10): 100 ug via INTRAVENOUS
  Filled 2016-11-07: qty 100

## 2016-11-07 MED ORDER — POTASSIUM PHOSPHATES 15 MMOLE/5ML IV SOLN
30.0000 mmol | Freq: Once | INTRAVENOUS | Status: AC
  Administered 2016-11-07: 30 mmol via INTRAVENOUS
  Filled 2016-11-07: qty 10

## 2016-11-07 MED ORDER — MIDAZOLAM BOLUS VIA INFUSION
2.0000 mg | INTRAVENOUS | Status: DC | PRN
  Administered 2016-11-06: 4 mg via INTRAVENOUS
  Administered 2016-11-06 – 2016-11-08 (×8): 2 mg via INTRAVENOUS
  Filled 2016-11-07: qty 4

## 2016-11-07 MED ORDER — FREE WATER
30.0000 mL | Freq: Four times a day (QID) | Status: DC
  Administered 2016-11-07 (×4): 30 mL

## 2016-11-07 MED ORDER — VITAL HIGH PROTEIN PO LIQD
1000.0000 mL | ORAL | Status: DC
  Administered 2016-11-07: 1000 mL

## 2016-11-07 NOTE — Progress Notes (Signed)
During WUA at start of shift, patient became very agitated, dysynchronous with the vent and not following commands.  Sedation increased at this time.  Multiple times during the shift, pt became increasingly more agitated with the slightest stimulation, requiring increased rate of continuous sedation along with intermittent sedation boluses.  [place], NP was at the bedside during one agitated episode; RASS goal changed to -2 and PRN vecuronium orders placed by NP for vent synchrony and added sedation.  See eMAR and flowsheets for further details.

## 2016-11-07 NOTE — Plan of Care (Signed)
Problem: Safety: Goal: Ability to remain free from injury will improve Outcome: Not Progressing Patient requiring increased doses of sedation to keep from thrashing around in bed and pulling out ETT and IV lines  Problem: Physical Regulation: Goal: Will remain free from infection Outcome: Not Progressing Patient currently on droplet precautions for influenza B  Problem: Tissue Perfusion: Goal: Risk factors for ineffective tissue perfusion will decrease Outcome: Progressing Patient on subQ lovonox daily and SCDs in place  Problem: Nutrition: Goal: Adequate nutrition will be maintained Outcome: Progressing Patient started on tube feeding for nutritional needs

## 2016-11-07 NOTE — Progress Notes (Signed)
[employer] - [place] [facility]   PATIENT NAME: Jennifer Vasquez    MR#:  [id number]  DATE OF BIRTH:  02-Jul-1986  SUBJECTIVE:   Patient here due to shortness of breath and acute respiratory failure with hypoxia secondary to COPD exacerbation with pneumonia. Patient intubated in the ER and currently remains intubated and sedated. Remains on 3 sedatives w/ intermittent use of paralytics due to agitation.   REVIEW OF SYSTEMS:    Review of Systems  Unable to perform ROS: Intubated    Nutrition: Tube feeds Tolerating Diet: Yes Tolerating PT: Await Eval once extubated.   DRUG ALLERGIES:   Allergies  Allergen Reactions  . Acetaminophen     Other reaction(s): Other (qualifier value) Due to hep c  . Amoxicillin-Pot Clavulanate Diarrhea and Nausea And Vomiting  . Gabapentin Hives  . Tetanus Toxoids   . Tramadol Hives  . Shellfish Allergy Rash    oysters    VITALS:  Blood pressure (!) 139/99, pulse 62, temperature 99 F (37.2 C), temperature source Oral, resp. rate 20, height 5\' 4"  (1.626 m), weight 94.2 kg (207 lb 10.8 oz), last menstrual period 10/10/2016, SpO2 97 %.  PHYSICAL EXAMINATION:   Physical Exam  GENERAL:  31 y.o.-year-old critically ill patient lying in bed sedated & intubated. EYES: Pupils equal, round, reactive to light. No scleral icterus. Extraocular muscles intact.  HEENT: Head atraumatic, normocephalic. Oropharynx and nasopharynx clear.  NECK:  Supple, no jugular venous distention. No thyroid enlargement, no tenderness.  LUNGS: Normal breath sounds bilaterally, no wheezing, rales, rhonchi. No use of accessory muscles of respiration.  CARDIOVASCULAR: S1, S2 normal. No murmurs, rubs, or gallops.  ABDOMEN: Soft, nontender, nondistended. Bowel sounds present. No organomegaly or mass.  EXTREMITIES: No cyanosis, clubbing or edema b/l.    NEUROLOGIC: Sedated & Intubated  PSYCHIATRIC: Sedated & Intubated.  SKIN: No obvious rash, lesion, or ulcer.     LABORATORY PANEL:   CBC  Recent Labs Lab 11/07/16 0319  WBC 14.7*  HGB 10.7*  HCT 31.5*  PLT 213   ------------------------------------------------------------------------------------------------------------------  Chemistries   Recent Labs Lab 11/06/16 0505  11/07/16 0319  NA 136  --  138  K 4.0  --  3.7  CL 98*  --  107  CO2 30  --  25  GLUCOSE 146*  --  138*  BUN 11  --  11  CREATININE 0.69  --  0.71  CALCIUM 8.1*  --  7.9*  MG  --   < > 2.5*  AST 25  --   --   ALT 17  --   --   ALKPHOS 65  --   --   BILITOT 0.7  --   --   < > = values in this interval not displayed. ------------------------------------------------------------------------------------------------------------------  Cardiac Enzymes  Recent Labs Lab 11/06/16 1128  TROPONINI 0.03*   ------------------------------------------------------------------------------------------------------------------  RADIOLOGY:  Dg Abdomen 1 View  Result Date: 11/05/2016 CLINICAL DATA:  Orogastric tube placement EXAM: ABDOMEN - 1 VIEW COMPARISON:  02/24/2015 lumbar spine radiographs FINDINGS: The tip and side port of a gastric tube is seen below the left hemidiaphragm in the expected location of the stomach. Patchy airspace opacities are noted of the visualized left mid and lower consistent pneumonia. Cardiomegaly is seen. Probable small left effusion. IMPRESSION: Gastric tube in the expected location the stomach. Left mid and lower lung airspace disease consistent pneumonia with probable small left effusion. Electronically Signed   By: Rene Kocher.D.  On: 11/05/2016 22:14   Dg Chest Port 1 View  Result Date: 11/07/2016 CLINICAL DATA:  Hypoxia EXAM: PORTABLE CHEST 1 VIEW COMPARISON:  November 06, 2016 FINDINGS: Endotracheal tube tip is 1.4 cm above the carina. Nasogastric tube tip and side port are in the stomach. There is no appreciable pneumothorax. There is patchy airspace consolidation in the left base. There  is atelectatic change in the medial right base with subtle airspace consolidation in the right lower lobe. Lungs elsewhere clear. Heart size and pulmonary vascularity are normal. No adenopathy. No bone lesions. IMPRESSION: Patchy infiltrate in both lung bases, somewhat more on the left than on the right. Tube positions as described without pneumothorax. Stable cardiac silhouette. Electronically Signed   By: Bretta Bang III M.D.   On: 11/07/2016 07:36   Dg Chest Port 1 View  Result Date: 11/06/2016 CLINICAL DATA:  Acute restrained failure EXAM: PORTABLE CHEST 1 VIEW COMPARISON:  11/05/2016 FINDINGS: Endotracheal tube with the tip 1.8 cm above the carina. Nasogastric tube coursing below the diaphragm. Bilateral interstitial thickening. Patchy airspace disease in the right upper lobe and left lower lobe. No pleural effusion or pneumothorax. Stable cardiomegaly. No acute osseous abnormality. IMPRESSION: 1. Bilateral interstitial and alveolar airspace opacities with mild cardiomegaly. Differential considerations include pulmonary edema versus multi lobar pneumonia. Electronically Signed   By: Elige Ko   On: 11/06/2016 08:48   Dg Chest Portable 1 View  Result Date: 11/05/2016 CLINICAL DATA:  Increasing dyspnea this evening. History of COPD and CHF. Cough with clear sputum. EXAM: PORTABLE CHEST 1 VIEW COMPARISON:  11/05/2014 CXR FINDINGS: Borderline cardiomegaly. Low-lying endotracheal tube tip 2.1 cm above the carina. Pullback 1 cm suggested. ACDF of the visualized cervical spine. Gastric tube is seen coiled beneath the left hemidiaphragm in the expected location of the stomach. Patchy confluent airspace opacities are noted in the left mid and lower lung consistent with pneumonia. Superimposed CHF is seen. No pneumothorax or significant pleural fluid. No acute nor suspicious osseous abnormality. IMPRESSION: 1. Left mid and lower lung airspace opacities suspicious for pneumonia. Superimposed CHF also  suspected. 2. Low-lying endotracheal tube approximately 2.1 cm above the carina. Pullback 1 cm suggested. 3. Gastric tube in place within the expected location of the stomach. Electronically Signed   By: Tollie Eth M.D.   On: 11/05/2016 22:12     ASSESSMENT AND PLAN:   31 year old female with past medical history of COPD, degenerative disc disease, bipolar disorder, hepatitis C, CHF who presented to the hospital due to shortness of breath and noted to be in acute respiratory failure with hypoxia secondary to COPD exacerbation and pneumonia.  1. Acute respiratory failure with hypoxia-secondary to COPD exacerbation due to flu with superimposed Pneumonia. -Now intubated and sedated. Patient is on 30% FiO2 and 5 of PEEP. -Continue vent support and further care as per intensivist.  2. COPD exacerbation-secondary to underlying Flu w/ superimpose pneumonia. -Continue broad-spectrum IV antibiotics with Zosyn, Levaquin, vancomycin. Cont. Tamiflu.  -- cont. duonebs.   3. Leukocytosis - due to Pneumonia.  - trending down with IV abx.   4. Elevated Trop - due to supply demand ischemia from hypoxia.  - Echo showing Normal LV function.   5. GERD - cont. Pepcid IV  6. Hypomagnesemia - improved and resolved w/ supplementation.   7. Nutrition - cont. Tube feedings and tolerating it.    All the records are reviewed and case discussed with Care Management/Social Worker. Management plans discussed with the patient, family and they  are in agreement.  CODE STATUS: Full code  DVT Prophylaxis: Lovenox  TOTAL TIME TAKING CARE OF THIS PATIENT: 30 minutes.   POSSIBLE D/C ?? DAYS, DEPENDING ON CLINICAL CONDITION.   Houston Siren M.D on 11/07/2016 at 1:28 PM  Between 7am to 6pm - Pager - [phone]  After 6pm go to [email or web address] - Social research officer, government  [employer] [place]  Office  [phone]  CC: Primary care physician; Carlean Jews, NP

## 2016-11-07 NOTE — Progress Notes (Addendum)
Initial Nutrition Assessment  DOCUMENTATION CODES:   Obesity unspecified  INTERVENTION:   Vital HP @ goal rate of 23ml/hr via OG tube  Free water flushes via OG tube @ 70ml q 6 hrs  Regimen provides 1320kcal/day, 116g/day protein, 1233ml/day free water  NUTRITION DIAGNOSIS:   Inadequate oral intake related to inability to eat as evidenced by NPO status, other (see comment) (pt ventillated ).  GOAL:   Patient will meet greater than or equal to 90% of their needs  MONITOR:   Vent status, Labs, Weight trends, I & O's, TF tolerance  REASON FOR ASSESSMENT:   Consult Enteral/tube feeding initiation and management  ASSESSMENT:   31 year old female with past medical history of COPD, degenerative disc disease, bipolar disorder, hepatitis C, CHF who presented to the hospital due to shortness of breath and noted to be in acute respiratory failure with hypoxia secondary to COPD exacerbation and pneumonia.   Pt sedated on ventilator. Pt has been having periods of agitation and requires sedation. Vascular congestion and fluid overload on x-ray per MD note; pt with history of CHF. Phosphors low today; continue to monitor and supplement as needed per MD discretion. Pt with weight gain since last admission possible r/t fluid changes.    Medications reviewed and include: lovenox, protonix, precedex, pepcid, fentanyl, midazolam, zosyn, KPhos, norcuron  Labs reviewed: Ca 7.9(L), P 2.1(L), Mg 2.5(H) Wbc- 14.7(H) cbgs- 109, 146, 138 x 48 hrs  Nutrition-Focused physical exam completed. Findings are no fat depletion, no muscle depletion, and mild edema in BUE.   Patient is currently intubated on ventilator support MV: 9.8 L/min Temp (24hrs), Avg:99.1 F (37.3 C), Min:98.6 F (37 C), Max:99.7 F (37.6 C)  Propofol: none  Diet Order:  Diet NPO time specified  Skin:  Reviewed, no issues  Last BM:  none since admit  Height:   Ht Readings from Last 1 Encounters:  11/06/16 5\' 4"   (1.626 m)    Weight:   Wt Readings from Last 1 Encounters:  11/07/16 207 lb 10.8 oz (94.2 kg)    Ideal Body Weight:  54.5 kg  BMI:  Body mass index is 35.65 kg/m.  Estimated Nutritional Needs:   Kcal:  1200-1400kcal/day   Protein:  104-122g/day   Fluid:  >1.2L/day   EDUCATION NEEDS:   No education needs identified at this time  11/09/16, RD, LDN Pager #[phone]

## 2016-11-07 NOTE — Progress Notes (Signed)
[facility] Critical Care Medicine Progess Note    SYNOPSIS  31 y/o caucasian female with a PMH of polysubstance abuse, bipolar disorder, questionable CHF, asthma/COPD, hepatitis C and chronic pain who presented to the ED with complaints of worsening dyspnea, fever and productive cough. Influenza B +   ASSESSMENT/PLAN  ASSESSMENT Acute hypoxic/hypercarbic respiratory failure -improving Influenza B Suspected aspiration pneumonia 2 to overdose Altered mental status H/O Hepatitis C h/o Bipolar disorder Hypophosphatemia  PLAN Will continue full vent support. Weaning trial as tolerated. BD. On zosyn/levaquin pending cultures. Continue tamiflu. Might need to hold sedation and extubate.  Monitor mental status closely. Will check ammonia level. GI/DVT prophylaxis: lovenox and PPI. Electrolyte replacement as needed. Full Code.  Cc: 32 minutes  Dorothyann Gibbs MD PCCM   SUBJECTIVE:  Patient seen and examined at bedside. Noted acute events overnight. On precedex, fentanyl and versed gtt. Intermittently agitated and received paralytics overnight. CxR shows improving pneumonia. She does not follow commands.    Intake/Output Summary (Last 24 hours) at 11/07/16 1414 Last data filed at 11/07/16 0600  Gross per 24 hour  Intake          4352.48 ml  Output             2285 ml  Net          2067.48 ml    VITAL SIGNS: Temp:  [98.6 F (37 C)-99.7 F (37.6 C)] 99 F (37.2 C) (04/29 0400) Pulse Rate:  [61-75] 69 (04/29 1300) Resp:  [20] 20 (04/29 1300) BP: (102-151)/(73-102) 135/98 (04/29 1300) SpO2:  [95 %-99 %] 96 % (04/29 1300) FiO2 (%):  [30 %-40 %] 30 % (04/29 1123) Weight:  [207 lb 10.8 oz (94.2 kg)] 207 lb 10.8 oz (94.2 kg) (04/29 0400)  PHYSICAL EXAMINATION:  VS: BP (!) 135/98   Pulse 69   Temp 99 F (37.2 C) (Oral)   Resp 20   Ht 5\' 4"  (1.626 m)   Wt 207 lb 10.8 oz (94.2 kg)   LMP 10/10/2016   SpO2 96%   BMI 35.65 kg/m    General Appearance: No distress    Neuro:without focal findings, mental status normal. HEENT: PERRLA, EOM intact. Pulmonary: normal breath sounds   CardiovascularNormal S1,S2.  No m/r/g.   Abdomen: Benign, Soft, non-tender. Renal:  No costovertebral tenderness  GU:  Not performed at this time. Endocrine: No evident thyromegaly. Skin:   warm, no rashes, no ecchymosis  Extremities: normal, no cyanosis, clubbing.    LABORATORY PANEL:   CBC  Recent Labs Lab 11/07/16 0319  WBC 14.7*  HGB 10.7*  HCT 31.5*  PLT 213    Chemistries   Recent Labs Lab 11/06/16 0505  11/07/16 0319  NA 136  --  138  K 4.0  --  3.7  CL 98*  --  107  CO2 30  --  25  GLUCOSE 146*  --  138*  BUN 11  --  11  CREATININE 0.69  --  0.71  CALCIUM 8.1*  --  7.9*  MG  --   < > 2.5*  PHOS  --   < > 2.1*  AST 25  --   --   ALT 17  --   --   ALKPHOS 65  --   --   BILITOT 0.7  --   --   < > = values in this interval not displayed.   Recent Labs Lab 11/06/16 0101 11/06/16 2111 11/07/16 0359 11/07/16 0728 11/07/16 1200  GLUCAP 253* 128* 138* 153* 152*    Recent Labs Lab 11/05/16 2145  PHART 7.38  PCO2ART 49*  PO2ART 214*    Recent Labs Lab 11/05/16 2050 11/06/16 0505  AST 31 25  ALT 18 17  ALKPHOS 67 65  BILITOT 0.6 0.7  ALBUMIN 3.6 3.1*    Cardiac Enzymes  Recent Labs Lab 11/06/16 1128  TROPONINI 0.03*    RADIOLOGY:  Dg Abdomen 1 View  Result Date: 11/05/2016 CLINICAL DATA:  Orogastric tube placement EXAM: ABDOMEN - 1 VIEW COMPARISON:  02/24/2015 lumbar spine radiographs FINDINGS: The tip and side port of a gastric tube is seen below the left hemidiaphragm in the expected location of the stomach. Patchy airspace opacities are noted of the visualized left mid and lower consistent pneumonia. Cardiomegaly is seen. Probable small left effusion. IMPRESSION: Gastric tube in the expected location the stomach. Left mid and lower lung airspace disease consistent pneumonia with probable small left effusion.  Electronically Signed   By: Tollie Eth M.D.   On: 11/05/2016 22:14   Dg Chest Port 1 View  Result Date: 11/07/2016 CLINICAL DATA:  Hypoxia EXAM: PORTABLE CHEST 1 VIEW COMPARISON:  November 06, 2016 FINDINGS: Endotracheal tube tip is 1.4 cm above the carina. Nasogastric tube tip and side port are in the stomach. There is no appreciable pneumothorax. There is patchy airspace consolidation in the left base. There is atelectatic change in the medial right base with subtle airspace consolidation in the right lower lobe. Lungs elsewhere clear. Heart size and pulmonary vascularity are normal. No adenopathy. No bone lesions. IMPRESSION: Patchy infiltrate in both lung bases, somewhat more on the left than on the right. Tube positions as described without pneumothorax. Stable cardiac silhouette. Electronically Signed   By: Bretta Bang III M.D.   On: 11/07/2016 07:36   Dg Chest Port 1 View  Result Date: 11/06/2016 CLINICAL DATA:  Acute restrained failure EXAM: PORTABLE CHEST 1 VIEW COMPARISON:  11/05/2016 FINDINGS: Endotracheal tube with the tip 1.8 cm above the carina. Nasogastric tube coursing below the diaphragm. Bilateral interstitial thickening. Patchy airspace disease in the right upper lobe and left lower lobe. No pleural effusion or pneumothorax. Stable cardiomegaly. No acute osseous abnormality. IMPRESSION: 1. Bilateral interstitial and alveolar airspace opacities with mild cardiomegaly. Differential considerations include pulmonary edema versus multi lobar pneumonia. Electronically Signed   By: Elige Ko   On: 11/06/2016 08:48   Dg Chest Portable 1 View  Result Date: 11/05/2016 CLINICAL DATA:  Increasing dyspnea this evening. History of COPD and CHF. Cough with clear sputum. EXAM: PORTABLE CHEST 1 VIEW COMPARISON:  11/05/2014 CXR FINDINGS: Borderline cardiomegaly. Low-lying endotracheal tube tip 2.1 cm above the carina. Pullback 1 cm suggested. ACDF of the visualized cervical spine. Gastric tube is  seen coiled beneath the left hemidiaphragm in the expected location of the stomach. Patchy confluent airspace opacities are noted in the left mid and lower lung consistent with pneumonia. Superimposed CHF is seen. No pneumothorax or significant pleural fluid. No acute nor suspicious osseous abnormality. IMPRESSION: 1. Left mid and lower lung airspace opacities suspicious for pneumonia. Superimposed CHF also suspected. 2. Low-lying endotracheal tube approximately 2.1 cm above the carina. Pullback 1 cm suggested. 3. Gastric tube in place within the expected location of the stomach. Electronically Signed   By: Tollie Eth M.D.   On: 11/05/2016 22:12        Dorothyann Gibbs MD   11/07/2016

## 2016-11-08 DIAGNOSIS — R410 Disorientation, unspecified: Secondary | ICD-10-CM

## 2016-11-08 DIAGNOSIS — J9601 Acute respiratory failure with hypoxia: Secondary | ICD-10-CM

## 2016-11-08 DIAGNOSIS — J1100 Influenza due to unidentified influenza virus with unspecified type of pneumonia: Secondary | ICD-10-CM

## 2016-11-08 DIAGNOSIS — J13 Pneumonia due to Streptococcus pneumoniae: Secondary | ICD-10-CM

## 2016-11-08 DIAGNOSIS — F1910 Other psychoactive substance abuse, uncomplicated: Secondary | ICD-10-CM

## 2016-11-08 DIAGNOSIS — R451 Restlessness and agitation: Secondary | ICD-10-CM

## 2016-11-08 LAB — GLUCOSE, CAPILLARY
GLUCOSE-CAPILLARY: 124 mg/dL — AB (ref 65–99)
Glucose-Capillary: 113 mg/dL — ABNORMAL HIGH (ref 65–99)
Glucose-Capillary: 125 mg/dL — ABNORMAL HIGH (ref 65–99)
Glucose-Capillary: 88 mg/dL (ref 65–99)
Glucose-Capillary: 99 mg/dL (ref 65–99)

## 2016-11-08 LAB — CULTURE, RESPIRATORY W GRAM STAIN

## 2016-11-08 LAB — MAGNESIUM: MAGNESIUM: 1.7 mg/dL (ref 1.7–2.4)

## 2016-11-08 LAB — CULTURE, RESPIRATORY

## 2016-11-08 LAB — PHOSPHORUS: PHOSPHORUS: 3.5 mg/dL (ref 2.5–4.6)

## 2016-11-08 LAB — TRIGLYCERIDES: TRIGLYCERIDES: 202 mg/dL — AB (ref <150)

## 2016-11-08 MED ORDER — VITAL HIGH PROTEIN PO LIQD
1000.0000 mL | ORAL | Status: DC
  Administered 2016-11-08 (×2)
  Administered 2016-11-08 – 2016-11-09 (×4): 1000 mL
  Administered 2016-11-09: 03:00:00
  Administered 2016-11-10: 1000 mL
  Administered 2016-11-11
  Administered 2016-11-11 – 2016-11-12 (×2): 1000 mL
  Administered 2016-11-12 (×2)

## 2016-11-08 MED ORDER — IBUPROFEN 400 MG PO TABS
600.0000 mg | ORAL_TABLET | Freq: Once | ORAL | Status: AC
  Administered 2016-11-08: 600 mg via ORAL
  Filled 2016-11-08: qty 2

## 2016-11-08 MED ORDER — FREE WATER
200.0000 mL | Freq: Three times a day (TID) | Status: DC
  Administered 2016-11-08 – 2016-11-13 (×14): 200 mL

## 2016-11-08 MED ORDER — PRO-STAT SUGAR FREE PO LIQD
30.0000 mL | Freq: Two times a day (BID) | ORAL | Status: DC
  Administered 2016-11-08 – 2016-11-12 (×10): 30 mL

## 2016-11-08 MED ORDER — BUDESONIDE 0.25 MG/2ML IN SUSP
0.2500 mg | Freq: Four times a day (QID) | RESPIRATORY_TRACT | Status: DC
  Administered 2016-11-08 – 2016-11-15 (×26): 0.25 mg via RESPIRATORY_TRACT
  Filled 2016-11-08 (×28): qty 2

## 2016-11-08 MED ORDER — FAMOTIDINE 20 MG PO TABS
20.0000 mg | ORAL_TABLET | Freq: Two times a day (BID) | ORAL | Status: DC
  Administered 2016-11-08 – 2016-11-12 (×10): 20 mg
  Filled 2016-11-08 (×10): qty 1

## 2016-11-08 MED ORDER — PROPOFOL 1000 MG/100ML IV EMUL
0.0000 ug/kg/min | INTRAVENOUS | Status: DC
  Administered 2016-11-08: 65 ug/kg/min via INTRAVENOUS
  Administered 2016-11-08: 5 ug/kg/min via INTRAVENOUS
  Administered 2016-11-08 – 2016-11-09 (×3): 80 ug/kg/min via INTRAVENOUS
  Administered 2016-11-09 (×2): 40 ug/kg/min via INTRAVENOUS
  Administered 2016-11-09: 40.572 ug/kg/min via INTRAVENOUS
  Administered 2016-11-09: 50 ug/kg/min via INTRAVENOUS
  Administered 2016-11-10 (×3): 30 ug/kg/min via INTRAVENOUS
  Administered 2016-11-10: 40 ug/kg/min via INTRAVENOUS
  Administered 2016-11-11: 15 ug/kg/min via INTRAVENOUS
  Administered 2016-11-11 – 2016-11-12 (×2): 30 ug/kg/min via INTRAVENOUS
  Administered 2016-11-12 (×2): 25 ug/kg/min via INTRAVENOUS
  Administered 2016-11-13: 29.968 ug/kg/min via INTRAVENOUS
  Filled 2016-11-08 (×19): qty 100

## 2016-11-08 NOTE — Progress Notes (Signed)
Pharmacy Antibiotic Note  Jennifer Vasquez is a 31 y.o. female admitted on 11/05/2016 with respiratory failure; with tracheal aspirate growing strep pneumoniae.  Pharmacy has been consulted for levofloxacin dosing.  Plan: Continue levofloxacin 750mg  IV Q24hr.   Height: 5\' 4"  (162.6 cm) Weight: 217 lb 6 oz (98.6 kg) IBW/kg (Calculated) : 54.7  Temp (24hrs), Avg:98.7 F (37.1 C), Min:98.2 F (36.8 C), Max:98.9 F (37.2 C)   Recent Labs Lab 11/05/16 2050 11/06/16 0111 11/06/16 0505 11/06/16 0644 11/07/16 0319  WBC 27.4*  --  25.1*  --  14.7*  CREATININE 0.94  --  0.69  --  0.71  LATICACIDVEN 1.4 2.4*  --  2.2*  --     Estimated Creatinine Clearance: 115.2 mL/min (by C-G formula based on SCr of 0.71 mg/dL).    Allergies  Allergen Reactions  . Acetaminophen     Other reaction(s): Other (qualifier value) Due to hep c  . Amoxicillin-Pot Clavulanate Diarrhea and Nausea And Vomiting  . Gabapentin Hives  . Tetanus Toxoids   . Tramadol Hives  . Shellfish Allergy Rash    oysters    Antimicrobials this admission: Vancomycin 4/27 >> 4/30 Zosyn 4/27 >> 4/30 Levofloxacin 4/27 >>  Oseltamivir 4/28 >> 5/2  Dose adjustments this admission: N/A  Microbiology results: 4/27 BCx: no growth x 3 days  4/27 UCx: no growth   4/27 Sputum: streptococcus pneumoniae  4/28 MRSA PCR: negative   Pharmacy will continue to monitor and adjust per consult.   Kathya Wilz L 11/08/2016 4:41 PM

## 2016-11-08 NOTE — Progress Notes (Signed)
Nutrition Follow-up  DOCUMENTATION CODES:   Obesity unspecified  INTERVENTION:  1. Decrease VHP to 5mL/hr, add 1 Pro-Stat 5mL BID With propofol @ 10.52mL/hr (390.17 calories) cumulatively provides 1430 calories, 104gm protein, 702cc free water (102% estimated needs)  NUTRITION DIAGNOSIS:   Inadequate oral intake related to inability to eat as evidenced by NPO status, other (see comment) (pt ventillated ). -ongoing  GOAL:   Patient will meet greater than or equal to 90% of their needs -meeting currently  MONITOR:   Vent status, Labs, Weight trends, I & O's, TF tolerance  REASON FOR ASSESSMENT:   Consult Enteral/tube feeding initiation and management  ASSESSMENT:   31 year old female with past medical history of COPD, degenerative disc disease, bipolar disorder, hepatitis C, CHF who presented to the hospital due to shortness of breath and noted to be in acute respiratory failure with hypoxia secondary to COPD exacerbation and pneumonia.  Patient is currently intubated on ventilator support MV: 9.9 L/min Temp (24hrs), Avg:98.6 F (37 C), Min:98.2 F (36.8 C), Max:98.9 F (37.2 C) Propofol: 10.8 ml/hr --> 390.17 calories Still No BM - currently on Senokot, PRN colace Labs and medications reviewed: CBGs 124 - 125,  Versed gtt, Fentanyl gtt  Diet Order:  Diet NPO time specified  Skin:  Reviewed, no issues  Last BM:  none since admit  Height:   Ht Readings from Last 1 Encounters:  11/06/16 5\' 4"  (1.626 m)    Weight:   Wt Readings from Last 1 Encounters:  11/08/16 217 lb 6 oz (98.6 kg)    Ideal Body Weight:  54.5 kg  BMI:  Body mass index is 37.31 kg/m.  Estimated Nutritional Needs:   Kcal:  1200-1400kcal/day   Protein:  104-122g/day   Fluid:  >1.2L/day   EDUCATION NEEDS:   No education needs identified at this time  11/10/16. Lev Cervone, MS, RD LDN Inpatient Clinical Dietitian Pager [phone]

## 2016-11-08 NOTE — Progress Notes (Signed)
PULMONARY / CRITICAL CARE MEDICINE   Name: Jennifer Vasquez MRN: [id number] DOB: Oct 01, 1985    ADMISSION DATE:  11/05/2016  PT PROFILE:   31 y.o. F smoker with history of polysubstance abuse, bipolar disorder, obstructive lung disease, hepatitis C virus, chronic pain syndrome presented to emergency department with respiratory distress, fever, cough. Nasal swab positive for influenza B. Failed attempted noninvasive ventilation and was intubated in the emergency department.  MAJOR EVENTS/TEST RESULTS: 04/28 echocardiogram: Normal systolic function, LVEF 55-65%, left atrial size at the upper limits of normal  INDWELLING DEVICES:: ETT 04/27 >>    MICRO DATA: Flu PCR 04/28 >> POS for influenza B MRSA PCR 04/28 >> NEG Urine 04/27 >> NEG Resp 04/28 >> pneumococcus Blood 04/27 >>   ANTIMICROBIALS:  Vancomycin 04/27 >> 04/30 PIP-tazo 04/28 >> 04/30 Levofloxacin 04/27 >>  Oseltamivir 04/28 >>    SUBJECTIVE:  Extremely agitated on wake-up assessment with patient-ventilator dyssynchrony. Not able to follow commands due to agitation  VITAL SIGNS: BP (!) 91/55   Pulse 96   Temp 98.6 F (37 C) (Axillary)   Resp 15   Ht 5\' 4"  (1.626 m)   Wt 217 lb 6 oz (98.6 kg)   LMP 10/10/2016   SpO2 94%   BMI 37.31 kg/m   HEMODYNAMICS:    VENTILATOR SETTINGS: Vent Mode: PRVC FiO2 (%):  [30 %-50 %] 40 % Set Rate:  [15 bmp-20 bmp] 15 bmp Vt Set:  [500 mL] 500 mL PEEP:  [5 cmH20] 5 cmH20  INTAKE / OUTPUT: I/O last 3 completed shifts: In: 9043.4 [I.V.:6993.9; NG/GT:1499.5; IV Piggyback:550] Out: 2985 [Urine:2985]  PHYSICAL EXAMINATION: General:  RASS -3 to +3, not F/C Neuro: CNs intact, moves all extremities vigorously HEENT: NCAT, sclerae white Cardiovascular: Tachycardia, regular, no murmurs Lungs: Diffuse coarse breath sounds with scattered rhonchi and wheezes Abdomen: Obese, soft, diminished bowel sounds Extremities: Warm, no edema Skin: No lesions  noted  LABS:  BMET  Recent Labs Lab 11/05/16 2050 11/06/16 0505 11/07/16 0319  NA 138 136 138  K 4.1 4.0 3.7  CL 98* 98* 107  CO2 31 30 25   BUN 16 11 11   CREATININE 0.94 0.69 0.71  GLUCOSE 109* 146* 138*    Electrolytes  Recent Labs Lab 11/05/16 2050 11/06/16 0505  11/07/16 0319 11/07/16 1512 11/08/16 0456  CALCIUM 9.2 8.1*  --  7.9*  --   --   MG  --   --   < > 2.5* 2.1 1.7  PHOS  --   --   < > 2.1* 3.9 3.5  < > = values in this interval not displayed.  CBC  Recent Labs Lab 11/05/16 2050 11/06/16 0505 11/07/16 0319  WBC 27.4* 25.1* 14.7*  HGB 13.1 11.6* 10.7*  HCT 39.0 33.5* 31.5*  PLT 216 203 213    Coag's No results for input(s): APTT, INR in the last 168 hours.  Sepsis Markers  Recent Labs Lab 11/05/16 2050 11/06/16 0111 11/06/16 0644  LATICACIDVEN 1.4 2.4* 2.2*    ABG  Recent Labs Lab 11/05/16 2145  PHART 7.38  PCO2ART 49*  PO2ART 214*    Liver Enzymes  Recent Labs Lab 11/05/16 2050 11/06/16 0505  AST 31 25  ALT 18 17  ALKPHOS 67 65  BILITOT 0.6 0.7  ALBUMIN 3.6 3.1*    Cardiac Enzymes  Recent Labs Lab 11/06/16 0111 11/06/16 0505 11/06/16 1128  TROPONINI 0.06* 0.04* 0.03*    Glucose  Recent Labs Lab 11/07/16 1952 11/07/16 2334 11/08/16 0342  11/08/16 0726 11/08/16 1109 11/08/16 1552  GLUCAP 123* 126* 113* 125* 124* 88    CXR (04/29): Patchy bilateral airspace disease   ASSESSMENT / PLAN:  PULMONARY A: Acute ventilator-dependent respiratory failure Influenza B pneumonitis History of obstructive lung disease with mild wheezing Smoker P:   Cont vent support - settings reviewed and/or adjusted Cont vent bundle Daily SBT if/when meets criteria Continue nebulized bronchodilators Add nebulized steroids Avoid systemic steroids due to severe agitation and delirium PRN vecuronium for patient-ventilator dyssynchrony  CARDIOVASCULAR A:  No acute issues P:  Monitor rhythm, blood  pressure  RENAL A:   No acute issues P:   Monitor BMET intermittently Monitor I/Os Correct electrolytes as indicated   GASTROINTESTINAL A:   No acute issues P:   SUP: Enteral famotidine Continue TF protocol  HEMATOLOGIC A:   Mild anemia without acute blood loss P:  DVT px: Enoxaparin Monitor CBC intermittently Transfuse per usual guidelines  INFECTIOUS A:   Influenza pneumonitis Pneumococcal pneumonia P:   Monitor temp, WBC count Micro and abx as above  ENDOCRINE A:   Mild hyperglycemia without history of diabetes P:   Monitor glucose on chemistry panels Consider SSI for glucose greater than 180  NEUROLOGIC A:   Polysubstance abuse Severe agitated delirium ICU/ventilator associated discomfort P:   RASS goal: -1, -2 PAD protocol with fentanyl, propofol infusions   FAMILY  - Updates: no family at bedside   CCM time: 45 mins The above time includes time spent in consultation with patient and/or family members and reviewing care plan on multidisciplinary rounds  Billy Fischer, MD PCCM service Mobile [phone] Pager [phone] 11/08/2016 4:28 PM

## 2016-11-08 NOTE — Progress Notes (Signed)
[employer] - [place] at [facility]   PATIENT NAME: Jennifer Vasquez    MR#:  [id number]  DATE OF BIRTH:  11-Jan-1986  SUBJECTIVE:   Patient here due to shortness of breath and acute respiratory failure with hypoxia secondary to COPD exacerbation with pneumonia. Patient intubated in the ER and currently remains intubated and sedated. Remains on 3 sedatives w/ intermittent use of paralytics due to agitation.   REVIEW OF SYSTEMS:    Review of Systems  Unable to perform ROS: Intubated    Nutrition: Tube feeds Tolerating Diet: Yes Tolerating PT: Await Eval once extubated.   DRUG ALLERGIES:   Allergies  Allergen Reactions  . Acetaminophen     Other reaction(s): Other (qualifier value) Due to hep c  . Amoxicillin-Pot Clavulanate Diarrhea and Nausea And Vomiting  . Gabapentin Hives  . Tetanus Toxoids   . Tramadol Hives  . Shellfish Allergy Rash    oysters    VITALS:  Blood pressure 109/66, pulse (!) 125, temperature (!) 102.9 F (39.4 C), temperature source Oral, resp. rate 15, height 5\' 4"  (1.626 m), weight 98.6 kg (217 lb 6 oz), last menstrual period 10/10/2016, SpO2 (!) 88 %.  PHYSICAL EXAMINATION:   Physical Exam  GENERAL:  31 y.o.-year-old critically ill patient lying in bed sedated & intubated. EYES: Pupils equal, round, reactive to light. No scleral icterus. Extraocular muscles not checked.  HEENT: Head atraumatic, normocephalic. Oropharynx and nasopharynx clear.  NECK:  Supple, no jugular venous distention. No thyroid enlargement, no tenderness.  LUNGS: Normal breath sounds bilaterally, no wheezing, rales, rhonchi. No use of accessory muscles of respiration.  CARDIOVASCULAR: S1, S2 normal. No murmurs, rubs, or gallops.  ABDOMEN: Soft, nontender, nondistended. Bowel sounds present. No organomegaly or mass.  EXTREMITIES: No cyanosis, clubbing or edema b/l.    NEUROLOGIC: Sedated & Intubated  PSYCHIATRIC: Sedated & Intubated.  SKIN: No obvious rash,  lesion, or ulcer.    LABORATORY PANEL:   CBC  Recent Labs Lab 11/07/16 0319  WBC 14.7*  HGB 10.7*  HCT 31.5*  PLT 213   ------------------------------------------------------------------------------------------------------------------  Chemistries   Recent Labs Lab 11/06/16 0505  11/07/16 0319  11/08/16 0456  NA 136  --  138  --   --   K 4.0  --  3.7  --   --   CL 98*  --  107  --   --   CO2 30  --  25  --   --   GLUCOSE 146*  --  138*  --   --   BUN 11  --  11  --   --   CREATININE 0.69  --  0.71  --   --   CALCIUM 8.1*  --  7.9*  --   --   MG  --   < > 2.5*  < > 1.7  AST 25  --   --   --   --   ALT 17  --   --   --   --   ALKPHOS 65  --   --   --   --   BILITOT 0.7  --   --   --   --   < > = values in this interval not displayed. ------------------------------------------------------------------------------------------------------------------  Cardiac Enzymes  Recent Labs Lab 11/06/16 1128  TROPONINI 0.03*   ------------------------------------------------------------------------------------------------------------------  RADIOLOGY:  Dg Chest Port 1 View  Result Date: 11/07/2016 CLINICAL DATA:  Hypoxia EXAM: PORTABLE CHEST 1 VIEW COMPARISON:  November 06, 2016 FINDINGS: Endotracheal tube tip is 1.4 cm above the carina. Nasogastric tube tip and side port are in the stomach. There is no appreciable pneumothorax. There is patchy airspace consolidation in the left base. There is atelectatic change in the medial right base with subtle airspace consolidation in the right lower lobe. Lungs elsewhere clear. Heart size and pulmonary vascularity are normal. No adenopathy. No bone lesions. IMPRESSION: Patchy infiltrate in both lung bases, somewhat more on the left than on the right. Tube positions as described without pneumothorax. Stable cardiac silhouette. Electronically Signed   By: Bretta Bang III M.D.   On: 11/07/2016 07:36     ASSESSMENT AND PLAN:    31 year old female with past medical history of COPD, degenerative disc disease, bipolar disorder, hepatitis C, CHF who presented to the hospital due to shortness of breath and noted to be in acute respiratory failure with hypoxia secondary to COPD exacerbation and pneumonia.  1. Acute respiratory failure with hypoxia-secondary to COPD exacerbation due to flu with superimposed Pneumonia. -Now intubated and sedated. Patient is on 30% FiO2 and 5 of PEEP. -Continue vent support and further care as per intensivist.  2. COPD exacerbation-secondary to underlying Flu w/ superimpose pneumonia. - giving IV antibiotics with Levaquin.. Cont. Tamiflu.  -- cont. duonebs.   3. Leukocytosis - due to Pneumonia.  - trending down with IV abx.   4. Elevated Trop - due to supply demand ischemia from hypoxia.  - Echo showing Normal LV function.   5. GERD - cont. Pepcid IV  6. Hypomagnesemia - improved and resolved w/ supplementation.   7. Nutrition - cont. Tube feedings and tolerating it.    All the records are reviewed and case discussed with Care Management/Social Worker. Management plans discussed with the patient, family and they are in agreement.  CODE STATUS: Full code  DVT Prophylaxis: Lovenox  TOTAL TIME TAKING CARE OF THIS PATIENT: 30 minutes.   POSSIBLE D/C ?? DAYS, DEPENDING ON CLINICAL CONDITION.   Altamese Dilling M.D on 11/08/2016 at 9:13 PM  Between 7am to 6pm - Pager - [phone]  After 6pm go to [email or web address] - Social research officer, government  [employer] [place]  Office  [phone]  CC: Primary care physician; Carlean Jews, NP

## 2016-11-09 ENCOUNTER — Inpatient Hospital Stay: Payer: BLUE CROSS/BLUE SHIELD

## 2016-11-09 DIAGNOSIS — Z9851 Tubal ligation status: Secondary | ICD-10-CM | POA: Diagnosis not present

## 2016-11-09 DIAGNOSIS — K219 Gastro-esophageal reflux disease without esophagitis: Secondary | ICD-10-CM | POA: Diagnosis present

## 2016-11-09 DIAGNOSIS — R062 Wheezing: Secondary | ICD-10-CM | POA: Diagnosis not present

## 2016-11-09 DIAGNOSIS — J1008 Influenza due to other identified influenza virus with other specified pneumonia: Secondary | ICD-10-CM | POA: Diagnosis present

## 2016-11-09 DIAGNOSIS — F319 Bipolar disorder, unspecified: Secondary | ICD-10-CM | POA: Diagnosis present

## 2016-11-09 DIAGNOSIS — J1100 Influenza due to unidentified influenza virus with unspecified type of pneumonia: Secondary | ICD-10-CM | POA: Diagnosis not present

## 2016-11-09 DIAGNOSIS — J1289 Other viral pneumonia: Secondary | ICD-10-CM | POA: Diagnosis not present

## 2016-11-09 DIAGNOSIS — J80 Acute respiratory distress syndrome: Secondary | ICD-10-CM

## 2016-11-09 DIAGNOSIS — J13 Pneumonia due to Streptococcus pneumoniae: Secondary | ICD-10-CM | POA: Diagnosis not present

## 2016-11-09 DIAGNOSIS — J189 Pneumonia, unspecified organism: Secondary | ICD-10-CM | POA: Diagnosis not present

## 2016-11-09 DIAGNOSIS — I5031 Acute diastolic (congestive) heart failure: Secondary | ICD-10-CM | POA: Diagnosis present

## 2016-11-09 DIAGNOSIS — B1920 Unspecified viral hepatitis C without hepatic coma: Secondary | ICD-10-CM | POA: Diagnosis present

## 2016-11-09 DIAGNOSIS — A419 Sepsis, unspecified organism: Secondary | ICD-10-CM | POA: Diagnosis present

## 2016-11-09 DIAGNOSIS — R410 Disorientation, unspecified: Secondary | ICD-10-CM | POA: Diagnosis not present

## 2016-11-09 DIAGNOSIS — Z8249 Family history of ischemic heart disease and other diseases of the circulatory system: Secondary | ICD-10-CM | POA: Diagnosis not present

## 2016-11-09 DIAGNOSIS — I248 Other forms of acute ischemic heart disease: Secondary | ICD-10-CM | POA: Diagnosis present

## 2016-11-09 DIAGNOSIS — R509 Fever, unspecified: Secondary | ICD-10-CM | POA: Diagnosis not present

## 2016-11-09 DIAGNOSIS — J101 Influenza due to other identified influenza virus with other respiratory manifestations: Secondary | ICD-10-CM | POA: Diagnosis present

## 2016-11-09 DIAGNOSIS — J441 Chronic obstructive pulmonary disease with (acute) exacerbation: Secondary | ICD-10-CM | POA: Diagnosis present

## 2016-11-09 DIAGNOSIS — J9601 Acute respiratory failure with hypoxia: Secondary | ICD-10-CM | POA: Diagnosis not present

## 2016-11-09 DIAGNOSIS — D649 Anemia, unspecified: Secondary | ICD-10-CM | POA: Diagnosis not present

## 2016-11-09 DIAGNOSIS — Z79899 Other long term (current) drug therapy: Secondary | ICD-10-CM | POA: Diagnosis not present

## 2016-11-09 DIAGNOSIS — J690 Pneumonitis due to inhalation of food and vomit: Secondary | ICD-10-CM | POA: Diagnosis present

## 2016-11-09 DIAGNOSIS — J1108 Influenza due to unidentified influenza virus with specified pneumonia: Secondary | ICD-10-CM | POA: Diagnosis not present

## 2016-11-09 DIAGNOSIS — F1910 Other psychoactive substance abuse, uncomplicated: Secondary | ICD-10-CM | POA: Diagnosis present

## 2016-11-09 DIAGNOSIS — Z7951 Long term (current) use of inhaled steroids: Secondary | ICD-10-CM | POA: Diagnosis not present

## 2016-11-09 DIAGNOSIS — R5381 Other malaise: Secondary | ICD-10-CM | POA: Diagnosis not present

## 2016-11-09 DIAGNOSIS — R451 Restlessness and agitation: Secondary | ICD-10-CM | POA: Diagnosis not present

## 2016-11-09 DIAGNOSIS — F17210 Nicotine dependence, cigarettes, uncomplicated: Secondary | ICD-10-CM | POA: Diagnosis present

## 2016-11-09 DIAGNOSIS — Z981 Arthrodesis status: Secondary | ICD-10-CM | POA: Diagnosis not present

## 2016-11-09 DIAGNOSIS — R0602 Shortness of breath: Secondary | ICD-10-CM | POA: Diagnosis present

## 2016-11-09 DIAGNOSIS — J440 Chronic obstructive pulmonary disease with acute lower respiratory infection: Secondary | ICD-10-CM | POA: Diagnosis present

## 2016-11-09 LAB — COMPREHENSIVE METABOLIC PANEL
ALT: 11 U/L — ABNORMAL LOW (ref 14–54)
ANION GAP: 5 (ref 5–15)
AST: 37 U/L (ref 15–41)
Albumin: 2.6 g/dL — ABNORMAL LOW (ref 3.5–5.0)
Alkaline Phosphatase: 55 U/L (ref 38–126)
BILIRUBIN TOTAL: 0.6 mg/dL (ref 0.3–1.2)
BUN: 13 mg/dL (ref 6–20)
CO2: 28 mmol/L (ref 22–32)
Calcium: 8.7 mg/dL — ABNORMAL LOW (ref 8.9–10.3)
Chloride: 104 mmol/L (ref 101–111)
Creatinine, Ser: 0.37 mg/dL — ABNORMAL LOW (ref 0.44–1.00)
Glucose, Bld: 116 mg/dL — ABNORMAL HIGH (ref 65–99)
POTASSIUM: 4.1 mmol/L (ref 3.5–5.1)
Sodium: 137 mmol/L (ref 135–145)
TOTAL PROTEIN: 6.5 g/dL (ref 6.5–8.1)

## 2016-11-09 LAB — GLUCOSE, CAPILLARY
GLUCOSE-CAPILLARY: 107 mg/dL — AB (ref 65–99)
GLUCOSE-CAPILLARY: 108 mg/dL — AB (ref 65–99)
GLUCOSE-CAPILLARY: 112 mg/dL — AB (ref 65–99)
GLUCOSE-CAPILLARY: 117 mg/dL — AB (ref 65–99)
GLUCOSE-CAPILLARY: 120 mg/dL — AB (ref 65–99)
Glucose-Capillary: 127 mg/dL — ABNORMAL HIGH (ref 65–99)
Glucose-Capillary: 97 mg/dL (ref 65–99)

## 2016-11-09 LAB — CBC
HEMATOCRIT: 31.2 % — AB (ref 35.0–47.0)
Hemoglobin: 10.7 g/dL — ABNORMAL LOW (ref 12.0–16.0)
MCH: 33.5 pg (ref 26.0–34.0)
MCHC: 34.2 g/dL (ref 32.0–36.0)
MCV: 97.9 fL (ref 80.0–100.0)
Platelets: 240 10*3/uL (ref 150–440)
RBC: 3.19 MIL/uL — ABNORMAL LOW (ref 3.80–5.20)
RDW: 13.7 % (ref 11.5–14.5)
WBC: 17.6 10*3/uL — ABNORMAL HIGH (ref 3.6–11.0)

## 2016-11-09 MED ORDER — ACETAMINOPHEN 160 MG/5ML PO SOLN
650.0000 mg | ORAL | Status: DC | PRN
  Administered 2016-11-09 – 2016-11-13 (×6): 650 mg
  Filled 2016-11-09 (×10): qty 20.3

## 2016-11-09 MED ORDER — MIDAZOLAM HCL 2 MG/2ML IJ SOLN
2.0000 mg | INTRAMUSCULAR | Status: DC | PRN
  Administered 2016-11-09: 2 mg via INTRAVENOUS
  Filled 2016-11-09 (×2): qty 2

## 2016-11-09 MED ORDER — QUETIAPINE FUMARATE 100 MG PO TABS
100.0000 mg | ORAL_TABLET | Freq: Two times a day (BID) | ORAL | Status: DC
  Administered 2016-11-09 (×2): 100 mg
  Filled 2016-11-09 (×2): qty 1

## 2016-11-09 MED ORDER — CLONAZEPAM 0.5 MG PO TABS
1.0000 mg | ORAL_TABLET | Freq: Two times a day (BID) | ORAL | Status: DC
  Administered 2016-11-09 (×2): 1 mg
  Filled 2016-11-09 (×2): qty 2

## 2016-11-09 MED ORDER — MIDAZOLAM HCL 2 MG/2ML IJ SOLN
1.0000 mg | INTRAMUSCULAR | Status: DC | PRN
  Administered 2016-11-09 – 2016-11-10 (×3): 2 mg via INTRAVENOUS
  Filled 2016-11-09 (×3): qty 2

## 2016-11-09 NOTE — Progress Notes (Signed)
[employer] - [place] at [facility]   PATIENT NAME: Jennifer Vasquez    MR#:  [id number]  DATE OF BIRTH:  10-Oct-1985  SUBJECTIVE:   Patient here due to shortness of breath and acute respiratory failure with hypoxia secondary to COPD exacerbation with pneumonia. Patient intubated in the ER and currently remains intubated and sedated. Remains on sedatives w/ intermittent use of paralytics due to agitation. Could not try SBT.  REVIEW OF SYSTEMS:    Review of Systems  Unable to perform ROS: Intubated    Nutrition: Tube feeds Tolerating Diet: Yes Tolerating PT: Await Eval once extubated.   DRUG ALLERGIES:   Allergies  Allergen Reactions  . Acetaminophen     Other reaction(s): Other (qualifier value) Due to hep c  . Amoxicillin-Pot Clavulanate Diarrhea and Nausea And Vomiting  . Gabapentin Hives  . Tetanus Toxoids   . Tramadol Hives  . Shellfish Allergy Rash    oysters    VITALS:  Blood pressure 111/64, pulse (!) 101, temperature (!) 100.7 F (38.2 C), temperature source Axillary, resp. rate (!) 24, height 5\' 4"  (1.626 m), weight 97.6 kg (215 lb 2.7 oz), last menstrual period 10/10/2016, SpO2 96 %.  PHYSICAL EXAMINATION:   Physical Exam  GENERAL:  31 y.o.-year-old critically ill patient lying in bed sedated & intubated. EYES: Pupils equal, round, reactive to light. No scleral icterus. Extraocular muscles not checked.  HEENT: Head atraumatic, normocephalic. Oropharynx and nasopharynx clear.  NECK:  Supple, no jugular venous distention. No thyroid enlargement, no tenderness.  LUNGS: Normal breath sounds bilaterally, no wheezing, rales, rhonchi. No use of accessory muscles of respiration.  CARDIOVASCULAR: S1, S2 normal. No murmurs, rubs, or gallops.  ABDOMEN: Soft, nontender, nondistended. Bowel sounds present. No organomegaly or mass.  EXTREMITIES: No cyanosis, clubbing or edema b/l.    NEUROLOGIC: Sedated & Intubated  PSYCHIATRIC: Sedated & Intubated.   SKIN: No obvious rash, lesion, or ulcer.    LABORATORY PANEL:   CBC  Recent Labs Lab 11/09/16 0418  WBC 17.6*  HGB 10.7*  HCT 31.2*  PLT 240   ------------------------------------------------------------------------------------------------------------------  Chemistries   Recent Labs Lab 11/08/16 0456 11/09/16 0418  NA  --  137  K  --  4.1  CL  --  104  CO2  --  28  GLUCOSE  --  116*  BUN  --  13  CREATININE  --  0.37*  CALCIUM  --  8.7*  MG 1.7  --   AST  --  37  ALT  --  11*  ALKPHOS  --  55  BILITOT  --  0.6   ------------------------------------------------------------------------------------------------------------------  Cardiac Enzymes  Recent Labs Lab 11/06/16 1128  TROPONINI 0.03*   ------------------------------------------------------------------------------------------------------------------  RADIOLOGY:  Dg Chest Port 1 View  Result Date: 11/09/2016 CLINICAL DATA:  Respiratory failure. EXAM: PORTABLE CHEST 1 VIEW COMPARISON:  11/07/2016. FINDINGS: Endotracheal tube and NG tube in stable position. Heart size stable. Diffuse bilateral pulmonary infiltrates/edema noted on today's exam. No pleural effusion or pneumothorax. No acute bony abnormality. Prior cervical spine fusion. IMPRESSION: 1. Lines and tubes in stable position. 2. Worsening bilateral airspace disease. Diffuse bilateral pulmonary infiltrates/edema noted on today's exam . Electronically Signed   By: 11/09/2016  Register   On: 11/09/2016 06:46     ASSESSMENT AND PLAN:   31 year old female with past medical history of COPD, degenerative disc disease, bipolar disorder, hepatitis C, CHF who presented to the hospital due to shortness of breath and noted to be in  acute respiratory failure with hypoxia secondary to COPD exacerbation and pneumonia.  1. Acute respiratory failure with hypoxia-secondary to COPD exacerbation due to flu with superimposed Pneumonia. -Now intubated and sedated.  Patient is on 50% FiO2 and 5 of PEEP. -Continue vent support and further care as per intensivist.   Failed SBT  2. COPD exacerbation-secondary to underlying Flu w/ superimpose pneumonia. - giving IV antibiotics with Levaquin.. Cont. Tamiflu.  -- cont. duonebs.   3. Leukocytosis - due to Pneumonia.  - trending down with IV abx.   4. Elevated Trop - due to supply demand ischemia from hypoxia.  - Echo showing Normal LV function.   5. GERD - cont. Pepcid IV  6. Hypomagnesemia - improved and resolved w/ supplementation.   7. Nutrition - cont. Tube feedings and tolerating it.    All the records are reviewed and case discussed with Care Management/Social Worker. Management plans discussed with the patient, family and they are in agreement.  CODE STATUS: Full code  DVT Prophylaxis: Lovenox  TOTAL TIME TAKING CARE OF THIS PATIENT: 30 minutes.   POSSIBLE D/C ?? DAYS, DEPENDING ON CLINICAL CONDITION.   Altamese Dilling M.D on 11/09/2016 at 7:21 PM  Between 7am to 6pm - Pager - [phone]  After 6pm go to [email or web address] - Social research officer, government  [employer] [place]  Office  [phone]  CC: Primary care physician; Carlean Jews, NP

## 2016-11-09 NOTE — Progress Notes (Signed)
Pharmacy Antibiotic Note  Jennifer Vasquez is a 31 y.o. female admitted on 11/05/2016 with respiratory failure; with tracheal aspirate growing strep pneumoniae.  Pharmacy has been consulted for levofloxacin dosing.  Day 5 WBC 17.6, SCr stable, febrile to 100.8 x1  Plan: Continue levofloxacin 750mg  IV Q24hr.    Height: 5\' 4"  (162.6 cm) Weight: 215 lb 2.7 oz (97.6 kg) IBW/kg (Calculated) : 54.7  Temp (24hrs), Avg:100 F (37.8 C), Min:98.6 F (37 C), Max:102.9 F (39.4 C)   Recent Labs Lab 11/05/16 2050 11/06/16 0111 11/06/16 0505 11/06/16 0644 11/07/16 0319 11/09/16 0418  WBC 27.4*  --  25.1*  --  14.7* 17.6*  CREATININE 0.94  --  0.69  --  0.71 0.37*  LATICACIDVEN 1.4 2.4*  --  2.2*  --   --     Estimated Creatinine Clearance: 114.6 mL/min (A) (by C-G formula based on SCr of 0.37 mg/dL (L)).    Allergies  Allergen Reactions  . Acetaminophen     Other reaction(s): Other (qualifier value) Due to hep c  . Amoxicillin-Pot Clavulanate Diarrhea and Nausea And Vomiting  . Gabapentin Hives  . Tetanus Toxoids   . Tramadol Hives  . Shellfish Allergy Rash    oysters    Antimicrobials this admission: Vancomycin 4/27 >> 4/30 Zosyn 4/27 >> 4/30 Levofloxacin 4/27 >>  Oseltamivir 4/28 >> 5/2  Dose adjustments this admission: N/A  Microbiology results: 4/27 BCx: no growth x 4 days  4/27 UCx: no growth   4/27 Sputum: streptococcus pneumoniae 4/28 MRSA PCR: negative   Pharmacy will continue to monitor and adjust per consult.   5/27, PharmD 11/09/2016 9:34 AM

## 2016-11-09 NOTE — Progress Notes (Signed)
PULMONARY / CRITICAL CARE MEDICINE   Name: Jennifer Vasquez MRN: [id number] DOB: [birth year]    ADMISSION DATE:  11/05/2016  PT PROFILE:   31 y.o. F smoker with history of polysubstance abuse, bipolar disorder, obstructive lung disease, hepatitis C virus, chronic pain syndrome presented to emergency department with respiratory distress, fever, cough. Nasal swab positive for influenza B. Failed attempted noninvasive ventilation and was intubated in the emergency department.  MAJOR EVENTS/TEST RESULTS: 04/28 echocardiogram: Normal systolic function, LVEF 55-65%, left atrial size at the upper limits of normal  INDWELLING DEVICES:: ETT 04/27 >>    MICRO DATA: Flu PCR 04/28 >> POS for influenza B MRSA PCR 04/28 >> NEG Urine 04/27 >> NEG Resp 04/28 >> pneumococcus Blood 04/27 >> NEG  ANTIMICROBIALS:  Vancomycin 04/27 >> 04/30 PIP-tazo 04/28 >> 04/30 Levofloxacin 04/27 >>  Oseltamivir 04/28 >>    SUBJECTIVE:  Again extremely agitated on wake-up assessment with patient-ventilator dyssynchrony and desaturation. Not able to follow commands due to sedation  VITAL SIGNS: BP 127/73 (BP Location: Left Arm)   Pulse (!) 117   Temp (!) 100.8 F (38.2 C) (Axillary)   Resp 16   Ht 5\' 4"  (1.626 m)   Wt 215 lb 2.7 oz (97.6 kg)   LMP 10/10/2016   SpO2 94%   BMI 36.93 kg/m   HEMODYNAMICS:    VENTILATOR SETTINGS: Vent Mode: PRVC FiO2 (%):  [40 %-50 %] 50 % Set Rate:  [15 bmp-24 bmp] 24 bmp Vt Set:  [500 mL] 500 mL PEEP:  [5 cmH20-8 cmH20] 8 cmH20 Plateau Pressure:  [30 cmH20] 30 cmH20  INTAKE / OUTPUT: I/O last 3 completed shifts: In: 9759.1 [I.V.:6779.2; NG/GT:2779.9; IV Piggyback:200] Out: 4275 [Urine:4275]  PHYSICAL EXAMINATION: General:  RASS -3 to +3, not F/C Neuro: CNs intact, moves all extremities vigorously HEENT: NCAT, sclerae white Cardiovascular: Tachycardia, regular, no murmurs Lungs: Diffuse coarse breath sounds with scattered rhonchi, no wheezes Abdomen: Obese,  soft, diminished bowel sounds Extremities: Warm, no edema Skin: No lesions noted  LABS:  BMET  Recent Labs Lab 11/06/16 0505 11/07/16 0319 11/09/16 0418  NA 136 138 137  K 4.0 3.7 4.1  CL 98* 107 104  CO2 30 25 28   BUN 11 11 13   CREATININE 0.69 0.71 0.37*  GLUCOSE 146* 138* 116*    Electrolytes  Recent Labs Lab 11/06/16 0505  11/07/16 0319 11/07/16 1512 11/08/16 0456 11/09/16 0418  CALCIUM 8.1*  --  7.9*  --   --  8.7*  MG  --   < > 2.5* 2.1 1.7  --   PHOS  --   < > 2.1* 3.9 3.5  --   < > = values in this interval not displayed.  CBC  Recent Labs Lab 11/06/16 0505 11/07/16 0319 11/09/16 0418  WBC 25.1* 14.7* 17.6*  HGB 11.6* 10.7* 10.7*  HCT 33.5* 31.5* 31.2*  PLT 203 213 240    Coag's No results for input(s): APTT, INR in the last 168 hours.  Sepsis Markers  Recent Labs Lab 11/05/16 2050 11/06/16 0111 11/06/16 0644  LATICACIDVEN 1.4 2.4* 2.2*    ABG  Recent Labs Lab 11/05/16 2145  PHART 7.38  PCO2ART 49*  PO2ART 214*    Liver Enzymes  Recent Labs Lab 11/05/16 2050 11/06/16 0505 11/09/16 0418  AST 31 25 37  ALT 18 17 11*  ALKPHOS 67 65 55  BILITOT 0.6 0.7 0.6  ALBUMIN 3.6 3.1* 2.6*    Cardiac Enzymes  Recent Labs Lab 11/06/16 0111  11/06/16 0505 11/06/16 1128  TROPONINI 0.06* 0.04* 0.03*    Glucose  Recent Labs Lab 11/08/16 1552 11/08/16 1947 11/08/16 2341 11/09/16 0343 11/09/16 0717 11/09/16 1123  GLUCAP 88 99 97 112* 108* 117*    CXR: worsening B infiltrates c/w ARDS   ASSESSMENT / PLAN:  PULMONARY A: Acute ventilator-dependent respiratory failure Influenza B pneumonitis ARDS History of obstructive lung disease Smoker P:   Cont vent support - settings reviewed and/or adjusted - Lung protective strategy Cont vent bundle Daily SBT if/when meets criteria Continue nebulized steroids and bronchodilators Avoiding systemic steroids due to severe agitation and delirium  CARDIOVASCULAR A:  Sinus  tachycardia - reactive P:  Monitor rhythm, blood pressure  RENAL A:   No acute issues P:   Monitor BMET intermittently Monitor I/Os Correct electrolytes as indicated   GASTROINTESTINAL A:   No acute issues P:   SUP: Enteral famotidine Continue TF protocol  HEMATOLOGIC A:   Mild anemia without acute blood loss P:  DVT px: Enoxaparin Monitor CBC intermittently Transfuse per usual guidelines  INFECTIOUS A:   Influenza pneumonitis Pneumococcal pneumonia P:   Monitor temp, WBC count Micro and abx as above  ENDOCRINE A:   Mild hyperglycemia without history of diabetes P:   Monitor glucose on chemistry panels Consider SSI for glucose greater than 180  NEUROLOGIC A:   Polysubstance abuse Severe agitated delirium ICU/ventilator associated discomfort P:   RASS goal: -1, -2 PAD protocol with fentanyl, propofol infusions Add scheduled quetiapine and clonazepam 05/01   FAMILY  - Updates: tried to contact mother. No answer   CCM time: 40 mins The above time includes time spent in consultation with patient and/or family members and reviewing care plan on multidisciplinary rounds  Billy Fischer, MD PCCM service Mobile [phone] Pager [phone] 11/09/2016 12:14 PM

## 2016-11-09 NOTE — Plan of Care (Signed)
Problem: Skin Integrity: Goal: Risk for impaired skin integrity will decrease Outcome: Progressing Patient bathed and repositioned Q2hour skin remains intact and free of breakdown  Problem: Tissue Perfusion: Goal: Risk factors for ineffective tissue perfusion will decrease Outcome: Progressing VTE prophylaxis continued as ordered

## 2016-11-10 ENCOUNTER — Inpatient Hospital Stay: Payer: BLUE CROSS/BLUE SHIELD

## 2016-11-10 DIAGNOSIS — J189 Pneumonia, unspecified organism: Secondary | ICD-10-CM

## 2016-11-10 DIAGNOSIS — J1289 Other viral pneumonia: Secondary | ICD-10-CM

## 2016-11-10 DIAGNOSIS — J1108 Influenza due to unidentified influenza virus with specified pneumonia: Secondary | ICD-10-CM

## 2016-11-10 LAB — GLUCOSE, CAPILLARY
GLUCOSE-CAPILLARY: 100 mg/dL — AB (ref 65–99)
GLUCOSE-CAPILLARY: 101 mg/dL — AB (ref 65–99)
GLUCOSE-CAPILLARY: 103 mg/dL — AB (ref 65–99)
Glucose-Capillary: 113 mg/dL — ABNORMAL HIGH (ref 65–99)
Glucose-Capillary: 109 mg/dL — ABNORMAL HIGH (ref 65–99)
Glucose-Capillary: 99 mg/dL (ref 65–99)

## 2016-11-10 LAB — COMPREHENSIVE METABOLIC PANEL
ALT: 10 U/L — ABNORMAL LOW (ref 14–54)
ANION GAP: 5 (ref 5–15)
AST: 23 U/L (ref 15–41)
Albumin: 2.4 g/dL — ABNORMAL LOW (ref 3.5–5.0)
Alkaline Phosphatase: 53 U/L (ref 38–126)
BUN: 12 mg/dL (ref 6–20)
CHLORIDE: 98 mmol/L — AB (ref 101–111)
CO2: 35 mmol/L — AB (ref 22–32)
Calcium: 8.5 mg/dL — ABNORMAL LOW (ref 8.9–10.3)
Creatinine, Ser: 0.36 mg/dL — ABNORMAL LOW (ref 0.44–1.00)
GFR calc non Af Amer: >60 mL/min (ref >60)
Glucose, Bld: 123 mg/dL — ABNORMAL HIGH (ref 65–99)
POTASSIUM: 3.8 mmol/L (ref 3.5–5.1)
SODIUM: 138 mmol/L (ref 135–145)
Total Bilirubin: 0.5 mg/dL (ref 0.3–1.2)
Total Protein: 6.3 g/dL — ABNORMAL LOW (ref 6.5–8.1)

## 2016-11-10 LAB — CBC
HCT: 27.4 % — ABNORMAL LOW (ref 35.0–47.0)
Hemoglobin: 9.3 g/dL — ABNORMAL LOW (ref 12.0–16.0)
MCH: 32.9 pg (ref 26.0–34.0)
MCHC: 34.1 g/dL (ref 32.0–36.0)
MCV: 96.4 fL (ref 80.0–100.0)
PLATELETS: 219 10*3/uL (ref 150–440)
RBC: 2.84 MIL/uL — AB (ref 3.80–5.20)
RDW: 13.6 % (ref 11.5–14.5)
WBC: 10.8 10*3/uL (ref 3.6–11.0)

## 2016-11-10 LAB — CULTURE, BLOOD (ROUTINE X 2)
CULTURE: NO GROWTH
Culture: NO GROWTH
Special Requests: ADEQUATE
Special Requests: ADEQUATE

## 2016-11-10 MED ORDER — FUROSEMIDE 10 MG/ML IJ SOLN
20.0000 mg | Freq: Four times a day (QID) | INTRAMUSCULAR | Status: AC
  Administered 2016-11-10 (×2): 20 mg via INTRAVENOUS
  Filled 2016-11-10 (×2): qty 2

## 2016-11-10 MED ORDER — CLONAZEPAM 0.5 MG PO TABS
2.0000 mg | ORAL_TABLET | Freq: Two times a day (BID) | ORAL | Status: DC
  Administered 2016-11-10 – 2016-11-11 (×4): 2 mg
  Filled 2016-11-10 (×4): qty 4

## 2016-11-10 MED ORDER — QUETIAPINE FUMARATE 100 MG PO TABS
200.0000 mg | ORAL_TABLET | Freq: Two times a day (BID) | ORAL | Status: DC
  Administered 2016-11-10 – 2016-11-11 (×4): 200 mg
  Filled 2016-11-10 (×4): qty 2

## 2016-11-10 MED ORDER — SODIUM CHLORIDE 0.9% FLUSH
10.0000 mL | Freq: Two times a day (BID) | INTRAVENOUS | Status: DC
  Administered 2016-11-10 – 2016-11-11 (×2): 10 mL
  Administered 2016-11-11: 30 mL
  Administered 2016-11-12 (×2): 10 mL
  Administered 2016-11-13: 20 mL
  Administered 2016-11-13 – 2016-11-14 (×2): 10 mL

## 2016-11-10 MED ORDER — SODIUM CHLORIDE 0.9% FLUSH: 10.0000 mL | INTRAVENOUS | Status: DC | PRN

## 2016-11-10 MED ORDER — POTASSIUM CHLORIDE 20 MEQ/15ML (10%) PO SOLN
40.0000 meq | Freq: Four times a day (QID) | ORAL | Status: AC
  Administered 2016-11-10 (×2): 40 meq
  Filled 2016-11-10 (×3): qty 30

## 2016-11-10 NOTE — Progress Notes (Signed)
PULMONARY / CRITICAL CARE MEDICINE   Name: Jennifer Vasquez MRN: [id number] DOB: 1985/11/12    ADMISSION DATE:  11/05/2016  PT PROFILE:   31 y.o. F smoker with history of polysubstance abuse, bipolar disorder, obstructive lung disease, hepatitis C virus, chronic pain syndrome presented to emergency department with respiratory distress, fever, cough. Nasal swab positive for influenza B. Failed attempted noninvasive ventilation and was intubated in the emergency department.  MAJOR EVENTS/TEST RESULTS: 04/28 echocardiogram: Normal systolic function, LVEF 55-65%, left atrial size at the upper limits of normal 05/01 Worsening bilateral pulmonary infiltrates c/w ARDS. Lung protection vent strategy initiated 05/02 increasing edema - furosemide X 2 doses  INDWELLING DEVICES:: ETT 04/27 >>  CVL 05/02 >>    MICRO DATA: Flu PCR 04/28 >> POS for influenza B MRSA PCR 04/28 >> NEG Urine 04/27 >> NEG Resp 04/28 >> pneumococcus Blood 04/27 >> NEG  ANTIMICROBIALS:  Vancomycin 04/27 >> 04/30 PIP-tazo 04/28 >> 04/30 Levofloxacin 04/27 >>  Oseltamivir 04/28 >> 05/02   SUBJECTIVE:  Continues to be extremely agitated on wake-up assessment with patient-ventilator dyssynchrony and desaturation. Not able to follow commands due to sedation  VITAL SIGNS: BP 105/61   Pulse 91   Temp 99.3 F (37.4 C) (Oral)   Resp (!) 24   Ht 5\' 4"  (1.626 m)   Wt 211 lb 10.3 oz (96 kg)   SpO2 98%   BMI 36.33 kg/m   HEMODYNAMICS:    VENTILATOR SETTINGS: Vent Mode: PRVC FiO2 (%):  [50 %] 50 % Set Rate:  [24 bmp] 24 bmp Vt Set:  [400 mL] 400 mL PEEP:  [8 cmH20] 8 cmH20 Plateau Pressure:  [29 cmH20-30 cmH20] 29 cmH20  INTAKE / OUTPUT: I/O last 3 completed shifts: In: 4685.9 [I.V.:1750.5; NG/GT:2785.4; IV Piggyback:150] Out: 3985 [Urine:3985]  PHYSICAL EXAMINATION: General:  RASS -3, not F/C Neuro: CNs intact, moves all extremities vigorously HEENT: NCAT, sclerae white Cardiovascular: Tachy, regular,  no murmurs Lungs: Diffuse coarse breath sounds, no wheezes Abdomen: Obese, soft, diminished bowel sounds Extremities: Warm, + BUE edema Skin: No lesions noted  LABS:  BMET  Recent Labs Lab 11/07/16 0319 11/09/16 0418 11/10/16 0605  NA 138 137 138  K 3.7 4.1 3.8  CL 107 104 98*  CO2 25 28 35*  BUN 11 13 12   CREATININE 0.71 0.37* 0.36*  GLUCOSE 138* 116* 123*    Electrolytes  Recent Labs Lab 11/07/16 0319 11/07/16 1512 11/08/16 0456 11/09/16 0418 11/10/16 0605  CALCIUM 7.9*  --   --  8.7* 8.5*  MG 2.5* 2.1 1.7  --   --   PHOS 2.1* 3.9 3.5  --   --     CBC  Recent Labs Lab 11/07/16 0319 11/09/16 0418 11/10/16 0605  WBC 14.7* 17.6* 10.8  HGB 10.7* 10.7* 9.3*  HCT 31.5* 31.2* 27.4*  PLT 213 240 219    Coag's No results for input(s): APTT, INR in the last 168 hours.  Sepsis Markers  Recent Labs Lab 11/05/16 2050 11/06/16 0111 11/06/16 0644  LATICACIDVEN 1.4 2.4* 2.2*    ABG  Recent Labs Lab 11/05/16 2145  PHART 7.38  PCO2ART 49*  PO2ART 214*    Liver Enzymes  Recent Labs Lab 11/06/16 0505 11/09/16 0418 11/10/16 0605  AST 25 37 23  ALT 17 11* 10*  ALKPHOS 65 55 53  BILITOT 0.7 0.6 0.5  ALBUMIN 3.1* 2.6* 2.4*    Cardiac Enzymes  Recent Labs Lab 11/06/16 0111 11/06/16 0505 11/06/16 1128  TROPONINI 0.06*  0.04* 0.03*    Glucose  Recent Labs Lab 11/09/16 1123 11/09/16 1808 11/09/16 2016 11/09/16 2343 11/10/16 0346 11/10/16 0745  GLUCAP 117* 127* 120* 107* 113* 100*    CXR: NSC B infiltrates c/w ARDS   ASSESSMENT / PLAN:  PULMONARY A: Acute hypoxemic respiratory failure Influenza B pneumonitis ARDS Smoker P:   Cont vent support - cont lung protection strategy Cont vent bundle Daily SBT if/when meets criteria Continue nebulized steroids and bronchodilators  CARDIOVASCULAR A:  Sinus tachycardia - reactive P:  Monitor rhythm, blood pressure  RENAL A:   Hypervolemia P:   Monitor BMET  intermittently Monitor I/Os Correct electrolytes as indicated  Furosemide X 2 doses 05/02  GASTROINTESTINAL A:   No acute issues P:   SUP: Enteral famotidine Continue TF protocol  HEMATOLOGIC A:   Mild anemia without acute blood loss P:  DVT px: Enoxaparin Monitor CBC intermittently Transfuse per usual guidelines  INFECTIOUS A:   Influenza pneumonitis Pneumococcal pneumonia P:   Monitor temp, WBC count Micro and abx as above  ENDOCRINE A:   Mild hyperglycemia without history of diabetes P:   Monitor glucose on chemistry panels Consider SSI for glucose greater than 180  NEUROLOGIC A:   Polysubstance abuse Severe agitated delirium ICU/ventilator associated discomfort P:   RASS goal: -2, -3 PAD protocol with fentanyl, propofol infusions Increased scheduled quetiapine and clonazepam 05/02   FAMILY  Father and niece updated @ bedside   CCM time: 40 mins The above time includes time spent in consultation with patient and/or family members and reviewing care plan on multidisciplinary rounds  Billy Fischer, MD PCCM service Mobile [phone] Pager [phone] 11/10/2016 11:54 AM

## 2016-11-10 NOTE — Progress Notes (Signed)
[employer] - [place] [facility]   PATIENT NAME: Jennifer Vasquez    MR#:  [id number]  DATE OF BIRTH:  January 16, 1986  SUBJECTIVE:   Patient here due to shortness of breath and acute respiratory failure with hypoxia secondary to COPD exacerbation with pneumonia. Patient intubated in the ER and currently remains intubated and sedated. Remains on sedatives w/ intermittent use of paralytics due to agitation. Could not try SBT. Pt had drug abuse history, so difficult trials sue to need for sedation.  REVIEW OF SYSTEMS:    Review of Systems  Unable to perform ROS: Intubated    Nutrition: Tube feeds Tolerating Diet: Yes Tolerating PT: Await Eval once extubated.   DRUG ALLERGIES:   Allergies  Allergen Reactions  . Acetaminophen     Other reaction(s): Other (qualifier value) Due to hep c  . Amoxicillin-Pot Clavulanate Diarrhea and Nausea And Vomiting  . Gabapentin Hives  . Tetanus Toxoids   . Tramadol Hives  . Shellfish Allergy Rash    oysters    VITALS:  Blood pressure (!) 104/56, pulse 93, temperature 99.2 F (37.3 C), temperature source Axillary, resp. rate (!) 24, height 5\' 4"  (1.626 m), weight 96 kg (211 lb 10.3 oz), SpO2 97 %.  PHYSICAL EXAMINATION:   Physical Exam  GENERAL:  31 y.o.-year-old critically ill patient lying in bed sedated & intubated. EYES: Pupils equal, round, reactive to light. No scleral icterus. Extraocular muscles not checked.  HEENT: Head atraumatic, normocephalic. Oropharynx and nasopharynx clear.  NECK:  Supple, no jugular venous distention. No thyroid enlargement, no tenderness.  LUNGS: Normal breath sounds bilaterally, no wheezing, rales, rhonchi. No use of accessory muscles of respiration.  CARDIOVASCULAR: S1, S2 normal. No murmurs, rubs, or gallops.  ABDOMEN: Soft, nontender, nondistended. Bowel sounds present. No organomegaly or mass.  EXTREMITIES: No cyanosis, clubbing or edema b/l.    NEUROLOGIC: Sedated & Intubated   PSYCHIATRIC: Sedated & Intubated.  SKIN: No obvious rash, lesion, or ulcer.    LABORATORY PANEL:   CBC  Recent Labs Lab 11/10/16 0605  WBC 10.8  HGB 9.3*  HCT 27.4*  PLT 219   ------------------------------------------------------------------------------------------------------------------  Chemistries   Recent Labs Lab 11/08/16 0456  11/10/16 0605  NA  --   < > 138  K  --   < > 3.8  CL  --   < > 98*  CO2  --   < > 35*  GLUCOSE  --   < > 123*  BUN  --   < > 12  CREATININE  --   < > 0.36*  CALCIUM  --   < > 8.5*  MG 1.7  --   --   AST  --   < > 23  ALT  --   < > 10*  ALKPHOS  --   < > 53  BILITOT  --   < > 0.5  < > = values in this interval not displayed. ------------------------------------------------------------------------------------------------------------------  Cardiac Enzymes  Recent Labs Lab 11/06/16 1128  TROPONINI 0.03*   ------------------------------------------------------------------------------------------------------------------  RADIOLOGY:  Dg Chest Port 1 View  Result Date: 11/10/2016 CLINICAL DATA:  Status post central line placed EXAM: PORTABLE CHEST 1 VIEW COMPARISON:  11/10/2016 FINDINGS: Left jugular central line is noted with the catheter tip in the distal superior vena cava. No pneumothorax is seen. An endotracheal tube and nasogastric catheter are again noted and stable. Persistent right basilar infiltrate is noted. Some improved aeration is noted in the left lung base when  compared with the prior exam. IMPRESSION: No pneumothorax following central line placement. Improved aeration left base is noted with persistent infiltrate in the right base. Electronically Signed   By: Alcide Clever M.D.   On: 11/10/2016 14:52   Dg Chest Port 1 View  Result Date: 11/10/2016 CLINICAL DATA:  Respiratory failure. EXAM: PORTABLE CHEST 1 VIEW COMPARISON:  11/09/2016. FINDINGS: Endotracheal tube, NG tube in stable position. Heart size stable. Diffuse  bilateral airspace disease with minimal improvement again noted. No prominent pleural effusion. No pneumothorax. Prior cervical spine fusion. IMPRESSION: 1.  Lines and tubes in stable position. 2. Diffuse bilateral airspace disease again noted. Minimal improvement. Electronically Signed   By: Maisie Fus  Register   On: 11/10/2016 06:53   Dg Chest Port 1 View  Result Date: 11/09/2016 CLINICAL DATA:  Respiratory failure. EXAM: PORTABLE CHEST 1 VIEW COMPARISON:  11/07/2016. FINDINGS: Endotracheal tube and NG tube in stable position. Heart size stable. Diffuse bilateral pulmonary infiltrates/edema noted on today's exam. No pleural effusion or pneumothorax. No acute bony abnormality. Prior cervical spine fusion. IMPRESSION: 1. Lines and tubes in stable position. 2. Worsening bilateral airspace disease. Diffuse bilateral pulmonary infiltrates/edema noted on today's exam . Electronically Signed   By: Maisie Fus  Register   On: 11/09/2016 06:46     ASSESSMENT AND PLAN:   31 year old female with past medical history of COPD, degenerative disc disease, bipolar disorder, hepatitis C, CHF who presented to the hospital due to shortness of breath and noted to be in acute respiratory failure with hypoxia secondary to COPD exacerbation and pneumonia.  1. Acute respiratory failure with hypoxia-secondary to COPD exacerbation due to flu with superimposed Pneumonia. -Now intubated and sedated. Patient is on 50% FiO2 and 8 of PEEP. -Continue vent support and further care as per intensivist.   Failed SBT repeatedly as have agitation on decreasing sedation.  2. COPD exacerbation-secondary to underlying Flu w/ superimpose pneumonia. - giving IV antibiotics with Levaquin.. Cont. Tamiflu.  -- cont. duonebs.   - tracheal aspirate have strep pneumonia on culture. Sensitive to levaquin. 3. Leukocytosis - due to Pneumonia.  - trending down with IV abx.   4. Elevated Trop - due to supply demand ischemia from hypoxia.  - Echo  showing Normal LV function.   5. GERD - cont. Pepcid IV  6. Hypomagnesemia - improved and resolved w/ supplementation.   7. Nutrition - cont. Tube feedings and tolerating it.    All the records are reviewed and case discussed with Care Management/Social Worker. Management plans discussed with the patient, family and they are in agreement.  CODE STATUS: Full code  DVT Prophylaxis: Lovenox  TOTAL TIME TAKING CARE OF THIS PATIENT: 30 minutes.   POSSIBLE D/C ?? DAYS, DEPENDING ON CLINICAL CONDITION.   Altamese Dilling M.D on 11/10/2016 at 9:17 PM  Between 7am to 6pm - Pager - [phone]  After 6pm go to [email or web address] - Social research officer, government  [employer] [place] [facility]  Office  [phone]  CC: Primary care physician; Carlean Jews, NP

## 2016-11-10 NOTE — Procedures (Signed)
Central Venous Catheter Insertion Procedure Note ADAHLIA STEMBRIDGE [id number] 1986-02-25  Procedure: Insertion of Central Venous Catheter Indications: Assessment of intravascular volume, Drug and/or fluid administration and Frequent blood sampling  Procedure Details Consent: Risks of procedure as well as the alternatives and risks of each were explained to the (patient/caregiver).  Consent for procedure obtained. Time Out: Verified patient identification, verified procedure, site/side was marked, verified correct patient position, special equipment/implants available, medications/allergies/relevent history reviewed, required imaging and test results available.  Performed  Maximum sterile technique was used including antiseptics, cap, gloves, gown, hand hygiene, mask and sheet. Skin prep: Chlorhexidine; local anesthetic administered A antimicrobial bonded/coated triple lumen catheter was placed in the left internal jugular vein using the Seldinger technique.  Evaluation Blood flow good Complications: No apparent complications Patient did tolerate procedure well. Chest X-ray ordered to verify placement.  CXR: pending.  Left internal jugular central line placed utilizing ultrasound no complications noted during or following procedure CXR pending.  Sonda Rumble, AGNP  Pulmonary/Critical Care Pager [phone] (please enter 7 digits) PCCM Consult Pager [phone] (please enter 7 digits)  Billy Fischer, MD PCCM service Mobile [phone] Pager [phone] 11/11/2016 12:28 PM

## 2016-11-10 NOTE — Plan of Care (Signed)
Problem: Physical Regulation: Goal: Ability to maintain clinical measurements within normal limits will improve Outcome: Progressing Patients clinical measurements have remained within normal limits, even with small decreases in sedation and pain medications.  Problem: Nutritional: Goal: Intake of prescribed amount of daily calories will improve Outcome: Progressing Patient has received prescribed amount of daily calories through ordered tube feedings infusing continuously.  Problem: Skin Integrity Impairment Risk: Goal: Risk for impaired skin integrity will decrease Outcome: Progressing Patient has been turned every two hours this shift to decrease risk of impaired skin integrity.

## 2016-11-11 ENCOUNTER — Inpatient Hospital Stay: Payer: BLUE CROSS/BLUE SHIELD

## 2016-11-11 LAB — BASIC METABOLIC PANEL
Anion gap: 4 — ABNORMAL LOW (ref 5–15)
BUN: 16 mg/dL (ref 6–20)
CHLORIDE: 97 mmol/L — AB (ref 101–111)
CO2: 37 mmol/L — AB (ref 22–32)
CREATININE: 0.61 mg/dL (ref 0.44–1.00)
Calcium: 8.7 mg/dL — ABNORMAL LOW (ref 8.9–10.3)
GFR calc Af Amer: >60 mL/min (ref >60)
GFR calc non Af Amer: >60 mL/min (ref >60)
GLUCOSE: 104 mg/dL — AB (ref 65–99)
Potassium: 3.7 mmol/L (ref 3.5–5.1)
SODIUM: 138 mmol/L (ref 135–145)

## 2016-11-11 LAB — GLUCOSE, CAPILLARY
GLUCOSE-CAPILLARY: 111 mg/dL — AB (ref 65–99)
GLUCOSE-CAPILLARY: 125 mg/dL — AB (ref 65–99)
Glucose-Capillary: 101 mg/dL — ABNORMAL HIGH (ref 65–99)
Glucose-Capillary: 106 mg/dL — ABNORMAL HIGH (ref 65–99)
Glucose-Capillary: 116 mg/dL — ABNORMAL HIGH (ref 65–99)

## 2016-11-11 LAB — CBC
HEMATOCRIT: 27.5 % — AB (ref 35.0–47.0)
HEMOGLOBIN: 9.3 g/dL — AB (ref 12.0–16.0)
MCH: 32.3 pg (ref 26.0–34.0)
MCHC: 33.7 g/dL (ref 32.0–36.0)
MCV: 95.9 fL (ref 80.0–100.0)
Platelets: 264 10*3/uL (ref 150–440)
RBC: 2.87 MIL/uL — AB (ref 3.80–5.20)
RDW: 13.5 % (ref 11.5–14.5)
WBC: 8.6 10*3/uL (ref 3.6–11.0)

## 2016-11-11 LAB — TRIGLYCERIDES: TRIGLYCERIDES: 201 mg/dL — AB (ref <150)

## 2016-11-11 MED ORDER — FUROSEMIDE 10 MG/ML IJ SOLN
20.0000 mg | Freq: Four times a day (QID) | INTRAMUSCULAR | Status: AC
  Administered 2016-11-11 (×2): 20 mg via INTRAVENOUS
  Filled 2016-11-11 (×2): qty 2

## 2016-11-11 MED ORDER — BISACODYL 10 MG RE SUPP
10.0000 mg | Freq: Once | RECTAL | Status: AC
  Administered 2016-11-11: 10 mg via RECTAL
  Filled 2016-11-11: qty 1

## 2016-11-11 MED ORDER — POTASSIUM CHLORIDE 20 MEQ/15ML (10%) PO SOLN
40.0000 meq | Freq: Four times a day (QID) | ORAL | Status: AC
  Administered 2016-11-11 (×2): 40 meq
  Filled 2016-11-11 (×3): qty 30

## 2016-11-11 MED ORDER — FENTANYL BOLUS VIA INFUSION
50.0000 ug | INTRAVENOUS | Status: DC | PRN
  Administered 2016-11-12 – 2016-11-13 (×3): 50 ug via INTRAVENOUS
  Filled 2016-11-11: qty 50

## 2016-11-11 MED ORDER — MIDAZOLAM HCL 2 MG/2ML IJ SOLN
1.0000 mg | INTRAMUSCULAR | Status: DC | PRN
  Administered 2016-11-12 – 2016-11-13 (×2): 1 mg via INTRAVENOUS
  Filled 2016-11-11 (×2): qty 2

## 2016-11-11 NOTE — Plan of Care (Signed)
Problem: Skin Integrity Impairment Risk: Goal: Risk for impaired skin integrity will decrease Outcome: Progressing Patient has been turned every two hours this shift which decreases her risk on skin impairment.

## 2016-11-11 NOTE — Progress Notes (Signed)
PULMONARY / CRITICAL CARE MEDICINE   Name: Jennifer Vasquez MRN: [id number] DOB: 09-06-1985    ADMISSION DATE:  11/05/2016  PT PROFILE:   31 y.o. F smoker with history of polysubstance abuse, bipolar disorder, obstructive lung disease, hepatitis C virus, chronic pain syndrome presented to emergency department with respiratory distress, fever, cough. Nasal swab positive for influenza B. Failed attempted noninvasive ventilation and was intubated in the emergency department.  MAJOR EVENTS/TEST RESULTS: 04/28 echocardiogram: Normal systolic function, LVEF 55-65%, left atrial size at the upper limits of normal 05/01 Worsening bilateral pulmonary infiltrates c/w ARDS. Lung protection vent strategy initiated 05/02 increasing edema - furosemide X 2 doses  INDWELLING DEVICES:: ETT 04/27 >>  L IJ CVL 05/02 >>   MICRO DATA: Flu PCR 04/28 >> POS for influenza B MRSA PCR 04/28 >> NEG Urine 04/27 >> NEG Resp 04/28 >> pneumococcus Blood 04/27 >> NEG  ANTIMICROBIALS:  Vancomycin 04/27 >> 04/30 Pip-tazo 04/28 >> 04/30 Oseltamivir 04/28 >> 05/02 Levofloxacin 04/27 >> 05/03   SUBJECTIVE:  Able to tolerate some decrease fentanyl, propofol infusions. Not able to F/C but furrows brow to voice  VITAL SIGNS: BP 122/69 (BP Location: Right Arm)   Pulse (!) 107   Temp 98.3 F (36.8 C) (Oral)   Resp 19   Ht 5\' 4"  (1.626 m)   Wt 208 lb 12.4 oz (94.7 kg)   SpO2 96%   BMI 35.84 kg/m   HEMODYNAMICS:    VENTILATOR SETTINGS: Vent Mode: PRVC FiO2 (%):  [40 %-50 %] 40 % Set Rate:  [16 bmp-24 bmp] 16 bmp Vt Set:  [400 mL] 400 mL PEEP:  [8 cmH20] 8 cmH20 Plateau Pressure:  [28 cmH20] 28 cmH20  INTAKE / OUTPUT: I/O last 3 completed shifts: In: 3764.8 [I.V.:1604.8; NG/GT:1860; IV Piggyback:300] Out: 5480 [Urine:5480]  PHYSICAL EXAMINATION: General:  RASS -2, not F/C Neuro: CNs intact, moves all extremities  HEENT: NCAT, sclerae white Cardiovascular: Regular, no murmurs Lungs: Diffuse  coarse breath sounds, no wheezes Abdomen: Obese, soft, diminished bowel sounds Extremities: Warm, improved BUE edema Skin: No lesions noted  LABS:  BMET  Recent Labs Lab 11/09/16 0418 11/10/16 0605 11/11/16 0347  NA 137 138 138  K 4.1 3.8 3.7  CL 104 98* 97*  CO2 28 35* 37*  BUN 13 12 16   CREATININE 0.37* 0.36* 0.61  GLUCOSE 116* 123* 104*    Electrolytes  Recent Labs Lab 11/07/16 0319 11/07/16 1512 11/08/16 0456 11/09/16 0418 11/10/16 0605 11/11/16 0347  CALCIUM 7.9*  --   --  8.7* 8.5* 8.7*  MG 2.5* 2.1 1.7  --   --   --   PHOS 2.1* 3.9 3.5  --   --   --     CBC  Recent Labs Lab 11/09/16 0418 11/10/16 0605 11/11/16 0347  WBC 17.6* 10.8 8.6  HGB 10.7* 9.3* 9.3*  HCT 31.2* 27.4* 27.5*  PLT 240 219 264    Coag's No results for input(s): APTT, INR in the last 168 hours.  Sepsis Markers  Recent Labs Lab 11/05/16 2050 11/06/16 0111 11/06/16 0644  LATICACIDVEN 1.4 2.4* 2.2*    ABG  Recent Labs Lab 11/05/16 2145  PHART 7.38  PCO2ART 49*  PO2ART 214*    Liver Enzymes  Recent Labs Lab 11/06/16 0505 11/09/16 0418 11/10/16 0605  AST 25 37 23  ALT 17 11* 10*  ALKPHOS 65 55 53  BILITOT 0.7 0.6 0.5  ALBUMIN 3.1* 2.6* 2.4*    Cardiac Enzymes  Recent Labs  Lab 11/06/16 0111 11/06/16 0505 11/06/16 1128  TROPONINI 0.06* 0.04* 0.03*    Glucose  Recent Labs Lab 11/10/16 1645 11/10/16 2040 11/10/16 2336 11/11/16 0358 11/11/16 0723 11/11/16 1157  GLUCAP 99 103* 101* 101* 106* 111*    CXR: improved B infiltrates c/w ARDS   ASSESSMENT / PLAN:  PULMONARY A: Acute hypoxemic respiratory failure Influenza B pneumonitis ARDS Smoker P:   Cont vent support - cont lung protection strategy Cont vent bundle Daily SBT if/when meets criteria Continue nebulized steroids and bronchodilators  CARDIOVASCULAR A:  Sinus tachycardia - reactive P:  Monitor rhythm, blood pressure  RENAL A:   Hypervolemia - improving P:    Monitor BMET intermittently Monitor I/Os Correct electrolytes as indicated  Repeat furosemide X 2 doses 05/03  GASTROINTESTINAL A:   No acute issues P:   SUP: Enteral famotidine Continue TF protocol  HEMATOLOGIC A:   Mild anemia without acute blood loss P:  DVT px: Enoxaparin Monitor CBC intermittently Transfuse per usual guidelines  INFECTIOUS A:   Influenza pneumonitis Pneumococcal pneumonia P:   Monitor temp, WBC count Micro and abx as above  ENDOCRINE A:   Mild hyperglycemia without history of diabetes P:   Monitor glucose on chemistry panels Consider SSI for glucose greater than 180  NEUROLOGIC A:   Polysubstance abuse Severe agitated delirium ICU/ventilator associated discomfort P:   RASS goal: 0, -1 PAD protocol with fentanyl, propofol infusions Continue scheduled quetiapine and clonazepam    FAMILY    CCM time: 35 mins The above time includes time spent in consultation with patient and/or family members and reviewing care plan on multidisciplinary rounds  Billy Fischer, MD PCCM service Mobile [phone] Pager [phone] 11/11/2016 12:28 PM

## 2016-11-11 NOTE — Plan of Care (Signed)
Problem: Respiratory: Goal: Ability to maintain a clear airway and adequate ventilation will improve Outcome: Progressing Patient has maintained adequate ventilation with decrease in sedation and Fi02. Patient was also able to maintain adequate ventilation when given a bath in bed.

## 2016-11-11 NOTE — Progress Notes (Signed)
[employer] - [place] at [facility]   PATIENT NAME: Madalynne Gutmann    MR#:  [id number]  DATE OF BIRTH:  Sep 12, 1985  SUBJECTIVE:   Patient here due to shortness of breath and acute respiratory failure with hypoxia secondary to COPD exacerbation with pneumonia. Patient intubated in the ER and currently remains intubated and sedated. Remains on sedatives w/ intermittent use of paralytics due to agitation. Could not try SBT. Pt had drug abuse history, so difficult trials sue to need for sedation.  failed trial today.  REVIEW OF SYSTEMS:    Review of Systems  Unable to perform ROS: Intubated    Nutrition: Tube feeds Tolerating Diet: Yes Tolerating PT: Await Eval once extubated.   DRUG ALLERGIES:   Allergies  Allergen Reactions  . Acetaminophen     Other reaction(s): Other (qualifier value) Due to hep c  . Amoxicillin-Pot Clavulanate Diarrhea and Nausea And Vomiting  . Gabapentin Hives  . Tetanus Toxoids   . Tramadol Hives  . Shellfish Allergy Rash    oysters    VITALS:  Blood pressure 117/68, pulse (!) 112, temperature 99.3 F (37.4 C), temperature source Oral, resp. rate (!) 26, height 5\' 4"  (1.626 m), weight 94.7 kg (208 lb 12.4 oz), SpO2 95 %.  PHYSICAL EXAMINATION:   Physical Exam  GENERAL:  31 y.o.-year-old critically ill patient lying in bed sedated & intubated. EYES: Pupils equal, round, reactive to light. No scleral icterus. Extraocular muscles not checked.  HEENT: Head atraumatic, normocephalic. Oropharynx and nasopharynx clear.  NECK:  Supple, no jugular venous distention. No thyroid enlargement, no tenderness.  LUNGS: Normal breath sounds bilaterally, no wheezing, rales, rhonchi. No use of accessory muscles of respiration. ET tube on vent support. CARDIOVASCULAR: S1, S2 normal. No murmurs, rubs, or gallops.  ABDOMEN: Soft, nontender, nondistended. Bowel sounds present. No organomegaly or mass.  EXTREMITIES: No cyanosis, clubbing or edema b/l.     NEUROLOGIC: Sedated & Intubated  PSYCHIATRIC: Sedated & Intubated.  SKIN: No obvious rash, lesion, or ulcer.    LABORATORY PANEL:   CBC  Recent Labs Lab 11/11/16 0347  WBC 8.6  HGB 9.3*  HCT 27.5*  PLT 264   ------------------------------------------------------------------------------------------------------------------  Chemistries   Recent Labs Lab 11/08/16 0456  11/10/16 0605 11/11/16 0347  NA  --   < > 138 138  K  --   < > 3.8 3.7  CL  --   < > 98* 97*  CO2  --   < > 35* 37*  GLUCOSE  --   < > 123* 104*  BUN  --   < > 12 16  CREATININE  --   < > 0.36* 0.61  CALCIUM  --   < > 8.5* 8.7*  MG 1.7  --   --   --   AST  --   < > 23  --   ALT  --   < > 10*  --   ALKPHOS  --   < > 53  --   BILITOT  --   < > 0.5  --   < > = values in this interval not displayed. ------------------------------------------------------------------------------------------------------------------  Cardiac Enzymes  Recent Labs Lab 11/06/16 1128  TROPONINI 0.03*   ------------------------------------------------------------------------------------------------------------------  RADIOLOGY:  Dg Chest Port 1 View  Result Date: 11/11/2016 CLINICAL DATA:  Respiratory failure. EXAM: PORTABLE CHEST 1 VIEW COMPARISON:  11/10/2016. FINDINGS: Endotracheal tube, NG tube, left IJ line stable position. Heart size stable. Persistent bibasilar infiltrates without interim change.  Small density with possible cavitation in the left upper lobe. Although these changes may be related to mild atelectasis, follow-up exam suggested to exclude developing infiltrate with possible cavitation. Prior cervical spine fusion. IMPRESSION: 1. Lines and tubes in stable position. 2. Persistent bibasilar infiltrates without interim change. 3. Small density with possible cavitation in the left upper lobe. Although these changes may be related to mild atelectasis, follow-up exam suggested to exclude developing infiltrate with  possible cavitation. Electronically Signed   By: Maisie Fus  Register   On: 11/11/2016 06:48   Dg Chest Port 1 View  Result Date: 11/10/2016 CLINICAL DATA:  Status post central line placed EXAM: PORTABLE CHEST 1 VIEW COMPARISON:  11/10/2016 FINDINGS: Left jugular central line is noted with the catheter tip in the distal superior vena cava. No pneumothorax is seen. An endotracheal tube and nasogastric catheter are again noted and stable. Persistent right basilar infiltrate is noted. Some improved aeration is noted in the left lung base when compared with the prior exam. IMPRESSION: No pneumothorax following central line placement. Improved aeration left base is noted with persistent infiltrate in the right base. Electronically Signed   By: Alcide Clever M.D.   On: 11/10/2016 14:52   Dg Chest Port 1 View  Result Date: 11/10/2016 CLINICAL DATA:  Respiratory failure. EXAM: PORTABLE CHEST 1 VIEW COMPARISON:  11/09/2016. FINDINGS: Endotracheal tube, NG tube in stable position. Heart size stable. Diffuse bilateral airspace disease with minimal improvement again noted. No prominent pleural effusion. No pneumothorax. Prior cervical spine fusion. IMPRESSION: 1.  Lines and tubes in stable position. 2. Diffuse bilateral airspace disease again noted. Minimal improvement. Electronically Signed   By: Maisie Fus  Register   On: 11/10/2016 06:53     ASSESSMENT AND PLAN:   31 year old female with past medical history of COPD, degenerative disc disease, bipolar disorder, hepatitis C, CHF who presented to the hospital due to shortness of breath and noted to be in acute respiratory failure with hypoxia secondary to COPD exacerbation and pneumonia.  1. Acute respiratory failure with hypoxia-secondary to COPD exacerbation due to flu with superimposed Pneumonia. -Now intubated and sedated. Patient is on 50% FiO2 and 8 of PEEP. -Continue vent support and further care as per intensivist.   Failed SBT repeatedly as have agitation on  decreasing sedation.  2. COPD exacerbation-secondary to underlying Flu w/ superimpose pneumonia. - giving IV antibiotics with Levaquin.. Cont. Tamiflu.  -- cont. duonebs.   - tracheal aspirate have strep pneumonia on culture. Sensitive to levaquin.  3. Leukocytosis - due to Pneumonia.  - trending down with IV abx.   4. Elevated Trop - due to supply demand ischemia from hypoxia.  - Echo showing Normal LV function.   5. GERD - cont. Pepcid IV  6. Hypomagnesemia - improved and resolved w/ supplementation.   7. Nutrition - cont. Tube feedings and tolerating it.    All the records are reviewed and case discussed with Care Management/Social Worker. Management plans discussed with the patient, family and they are in agreement.  CODE STATUS: Full code  DVT Prophylaxis: Lovenox  TOTAL TIME TAKING CARE OF THIS PATIENT: 30 minutes.   POSSIBLE D/C ?? DAYS, DEPENDING ON CLINICAL CONDITION.   Altamese Dilling M.D on 11/11/2016 at 11:50 AM  Between 7am to 6pm - Pager - [phone]  After 6pm go to [email or web address] - Social research officer, government  [employer] [facility]  Office  [phone]  CC: Primary care physician; Carlean Jews, NP

## 2016-11-12 ENCOUNTER — Inpatient Hospital Stay: Payer: BLUE CROSS/BLUE SHIELD

## 2016-11-12 DIAGNOSIS — R509 Fever, unspecified: Secondary | ICD-10-CM

## 2016-11-12 DIAGNOSIS — J101 Influenza due to other identified influenza virus with other respiratory manifestations: Secondary | ICD-10-CM

## 2016-11-12 LAB — BASIC METABOLIC PANEL
ANION GAP: 9 (ref 5–15)
BUN: 22 mg/dL — ABNORMAL HIGH (ref 6–20)
CALCIUM: 8.8 mg/dL — AB (ref 8.9–10.3)
CHLORIDE: 97 mmol/L — AB (ref 101–111)
CO2: 34 mmol/L — AB (ref 22–32)
Creatinine, Ser: 0.54 mg/dL (ref 0.44–1.00)
GFR calc Af Amer: >60 mL/min (ref >60)
GFR calc non Af Amer: >60 mL/min (ref >60)
GLUCOSE: 125 mg/dL — AB (ref 65–99)
POTASSIUM: 3.6 mmol/L (ref 3.5–5.1)
Sodium: 140 mmol/L (ref 135–145)

## 2016-11-12 LAB — CBC
HEMATOCRIT: 30.7 % — AB (ref 35.0–47.0)
HEMOGLOBIN: 10.4 g/dL — AB (ref 12.0–16.0)
MCH: 33 pg (ref 26.0–34.0)
MCHC: 33.9 g/dL (ref 32.0–36.0)
MCV: 97.3 fL (ref 80.0–100.0)
Platelets: 307 10*3/uL (ref 150–440)
RBC: 3.15 MIL/uL — ABNORMAL LOW (ref 3.80–5.20)
RDW: 13.2 % (ref 11.5–14.5)
WBC: 14.8 10*3/uL — ABNORMAL HIGH (ref 3.6–11.0)

## 2016-11-12 LAB — GLUCOSE, CAPILLARY
GLUCOSE-CAPILLARY: 111 mg/dL — AB (ref 65–99)
GLUCOSE-CAPILLARY: 123 mg/dL — AB (ref 65–99)
Glucose-Capillary: 109 mg/dL — ABNORMAL HIGH (ref 65–99)

## 2016-11-12 LAB — PROCALCITONIN: Procalcitonin: 0.11 ng/mL

## 2016-11-12 MED ORDER — CLONAZEPAM 0.5 MG PO TABS
2.0000 mg | ORAL_TABLET | Freq: Every day | ORAL | Status: DC
  Administered 2016-11-12: 2 mg
  Filled 2016-11-12: qty 4

## 2016-11-12 MED ORDER — POTASSIUM CHLORIDE 20 MEQ/15ML (10%) PO SOLN
40.0000 meq | Freq: Once | ORAL | Status: AC
  Administered 2016-11-12: 40 meq
  Filled 2016-11-12: qty 30

## 2016-11-12 MED ORDER — QUETIAPINE FUMARATE 100 MG PO TABS
100.0000 mg | ORAL_TABLET | Freq: Every day | ORAL | Status: DC
  Administered 2016-11-12: 100 mg
  Filled 2016-11-12: qty 1

## 2016-11-12 NOTE — Plan of Care (Signed)
Problem: Pain Managment: Goal: General experience of comfort will improve Outcome: Not Progressing Patient requiring an increase in sedation this shift to remain comfortable overall and to remain synchronous with ventilator  Problem: Physical Regulation: Goal: Will remain free from infection Outcome: Not Progressing Patient developed a fever during shift with Tmax 102.4; pan cultures ordered; oral tylenol given; ice packs placed in axilla

## 2016-11-12 NOTE — Progress Notes (Signed)
PULMONARY / CRITICAL CARE MEDICINE   Name: Jennifer Vasquez MRN: [id number] DOB: July 29, 1985    ADMISSION DATE:  11/05/2016  PT PROFILE:   31 y.o. F smoker with history of polysubstance abuse, bipolar disorder, obstructive lung disease, hepatitis C virus, chronic pain syndrome presented to emergency department with respiratory distress, fever, cough. Nasal swab positive for influenza B. Failed attempted noninvasive ventilation and was intubated in the emergency department.  MAJOR EVENTS/TEST RESULTS: 04/28 echocardiogram: Normal systolic function, LVEF 55-65%, left atrial size at the upper limits of normal 05/01 Worsening bilateral pulmonary infiltrates c/w ARDS. Lung protection vent strategy initiated 05/02 increasing edema - furosemide X 2 doses 05/03 Repeat furosemide. Agitation improving 05/04 Isolated fever. Recultured. Failed SBT. Tolerated PSV 14 cm H2O  INDWELLING DEVICES:: ETT 04/27 >>  L IJ CVL 05/02 >>   MICRO DATA: Flu PCR 04/28 >> POS for influenza B MRSA PCR 04/28 >> NEG Urine 04/27 >> NEG Resp 04/28 >> pneumococcus Blood 04/27 >> NEG Urine 05/04 >>  Resp 05/04 >>  Blood 05/04 >>    ANTIMICROBIALS:  Vancomycin 04/27 >> 04/30 Pip-tazo 04/28 >> 04/30 Oseltamivir 04/28 >> 05/02 Levofloxacin 04/27 >> 05/03   SUBJECTIVE:  Failed SBT. Calmer on WUA. Not F/C. Tolerating PSV 14 cm H2O  VITAL SIGNS: BP (!) 108/58   Pulse 99   Temp 99.5 F (37.5 C)   Resp 19   Ht 5\' 4"  (1.626 m)   Wt 203 lb 0.7 oz (92.1 kg)   SpO2 95%   BMI 34.85 kg/m   HEMODYNAMICS:    VENTILATOR SETTINGS: Vent Mode: PSV FiO2 (%):  [35 %] 35 % Set Rate:  [16 bmp] 16 bmp Vt Set:  [400 mL] 400 mL PEEP:  [5 cmH20-8 cmH20] 5 cmH20 Pressure Support:  [14 cmH20] 14 cmH20  INTAKE / OUTPUT: I/O last 3 completed shifts: In: 3645 [I.V.:1200; NG/GT:2145; IV Piggyback:300] Out: 4795 [Urine:4795]  PHYSICAL EXAMINATION: General:  RASS -2, not F/C Neuro: CNs intact, moves all extremities   HEENT: NCAT, sclerae white Cardiovascular: Regular, no murmurs Lungs: few rhonchi, no wheezes Abdomen: Obese, soft, diminished bowel sounds Extremities: Warm, no edema Skin: No lesions noted  LABS:  BMET  Recent Labs Lab 11/10/16 0605 11/11/16 0347 11/12/16 0440  NA 138 138 140  K 3.8 3.7 3.6  CL 98* 97* 97*  CO2 35* 37* 34*  BUN 12 16 22*  CREATININE 0.36* 0.61 0.54  GLUCOSE 123* 104* 125*    Electrolytes  Recent Labs Lab 11/07/16 0319 11/07/16 1512 11/08/16 0456  11/10/16 0605 11/11/16 0347 11/12/16 0440  CALCIUM 7.9*  --   --   < > 8.5* 8.7* 8.8*  MG 2.5* 2.1 1.7  --   --   --   --   PHOS 2.1* 3.9 3.5  --   --   --   --   < > = values in this interval not displayed.  CBC  Recent Labs Lab 11/10/16 0605 11/11/16 0347 11/12/16 0440  WBC 10.8 8.6 14.8*  HGB 9.3* 9.3* 10.4*  HCT 27.4* 27.5* 30.7*  PLT 219 264 307    Coag's No results for input(s): APTT, INR in the last 168 hours.  Sepsis Markers  Recent Labs Lab 11/05/16 2050 11/06/16 0111 11/06/16 0644 11/12/16 0440  LATICACIDVEN 1.4 2.4* 2.2*  --   PROCALCITON  --   --   --  0.11    ABG  Recent Labs Lab 11/05/16 2145  PHART 7.38  PCO2ART 49*  PO2ART 214*    Liver Enzymes  Recent Labs Lab 11/06/16 0505 11/09/16 0418 11/10/16 0605  AST 25 37 23  ALT 17 11* 10*  ALKPHOS 65 55 53  BILITOT 0.7 0.6 0.5  ALBUMIN 3.1* 2.6* 2.4*    Cardiac Enzymes  Recent Labs Lab 11/06/16 0111 11/06/16 0505 11/06/16 1128  TROPONINI 0.06* 0.04* 0.03*    Glucose  Recent Labs Lab 11/11/16 0358 11/11/16 0723 11/11/16 1157 11/11/16 1638 11/11/16 2343 11/12/16 0721  GLUCAP 101* 106* 111* 125* 116* 123*    CXR: overall improved B infiltrates. RLL atx/infiltrrate   ASSESSMENT / PLAN:  PULMONARY A: Acute hypoxemic respiratory failure Influenza B pneumonitis ARDS, resolving Smoker P:   Cont vent support - settings reviewed and/or adjusted PSV mode as tolerated Cont vent  bundle Daily SBT if/when meets criteria Continue nebulized steroids and bronchodilators  CARDIOVASCULAR A:  Sinus tachycardia - reactive P:  Monitor rhythm, blood pressure  RENAL A:   Hypervolemia - improving P:   Monitor BMET intermittently Monitor I/Os Correct electrolytes as indicated  Repeat furosemide X 2 doses 05/03  GASTROINTESTINAL A:   No acute issues P:   SUP: Enteral famotidine Continue TF protocol  HEMATOLOGIC A:   Mild anemia without acute blood loss P:  DVT px: Enoxaparin Monitor CBC intermittently Transfuse per usual guidelines  INFECTIOUS A:   Influenza pneumonitis Pneumococcal pneumonia Isolated fever without definite infectious source P:   Monitor temp, WBC count Micro and abx as above PCT protocol  ENDOCRINE A:   Mild hyperglycemia without history of diabetes P:   Monitor glucose on chemistry panels Consider SSI for glucose greater than 180  NEUROLOGIC A:   Polysubstance abuse Severe agitated delirium ICU/ventilator associated discomfort P:   RASS goal: 0, -1 PAD protocol with fentanyl, propofol infusions Scheduled quetiapine and clonazepam doses decreased   FAMILY  Father updated @ bedside  CCM time: 35 mins The above time includes time spent in consultation with patient and/or family members and reviewing care plan on multidisciplinary rounds  Billy Fischer, MD PCCM service Mobile [phone] Pager [phone] 11/12/2016 1:47 PM

## 2016-11-12 NOTE — Progress Notes (Signed)
[employer] -  at [facility]   PATIENT NAME: Jennifer Vasquez    MR#:  [id number]  DATE OF BIRTH:  25-Mar-1986  SUBJECTIVE:   Patient here due to shortness of breath and acute respiratory failure with hypoxia secondary to COPD exacerbation with pneumonia. Patient intubated in the ER and currently remains intubated and sedated. Remains on sedatives w/ intermittent use of paralytics due to agitation. Could not try SBT. Pt had drug abuse history, so difficult trials sue to need for sedation.  failed trial today. Had fever 11/12/16 early morning.  REVIEW OF SYSTEMS:    Review of Systems  Unable to perform ROS: Intubated    Nutrition: Tube feeds Tolerating Diet: Yes Tolerating PT: Await Eval once extubated.   DRUG ALLERGIES:   Allergies  Allergen Reactions  . Acetaminophen     Other reaction(s): Other (qualifier value) Due to hep c  . Amoxicillin-Pot Clavulanate Diarrhea and Nausea And Vomiting  . Gabapentin Hives  . Tetanus Toxoids   . Tramadol Hives  . Shellfish Allergy Rash    oysters    VITALS:  Blood pressure 116/70, pulse (!) 110, temperature (!) 100.8 F (38.2 C), resp. rate (!) 21, height 5\' 4"  (1.626 m), weight 92.1 kg (203 lb 0.7 oz), SpO2 95 %.  PHYSICAL EXAMINATION:   Physical Exam  GENERAL:  31 y.o.-year-old critically ill patient lying in bed sedated & intubated. EYES: Pupils equal, round, reactive to light. No scleral icterus. Extraocular muscles not checked.  HEENT: Head atraumatic, normocephalic. Oropharynx and nasopharynx clear.  NECK:  Supple, no jugular venous distention. No thyroid enlargement, no tenderness.  LUNGS: Normal breath sounds bilaterally, no wheezing, rales, rhonchi. No use of accessory muscles of respiration. ET tube on vent support. CARDIOVASCULAR: S1, S2 normal. No murmurs, rubs, or gallops.  ABDOMEN: Soft, nontender, nondistended. Bowel sounds present. No organomegaly or mass.  EXTREMITIES: No cyanosis, clubbing or  edema b/l.    NEUROLOGIC: Sedated & Intubated  PSYCHIATRIC: Sedated & Intubated.  SKIN: No obvious rash, lesion, or ulcer.    LABORATORY PANEL:   CBC  Recent Labs Lab 11/12/16 0440  WBC 14.8*  HGB 10.4*  HCT 30.7*  PLT 307   ------------------------------------------------------------------------------------------------------------------  Chemistries   Recent Labs Lab 11/08/16 0456  11/10/16 0605  11/12/16 0440  NA  --   < > 138  < > 140  K  --   < > 3.8  < > 3.6  CL  --   < > 98*  < > 97*  CO2  --   < > 35*  < > 34*  GLUCOSE  --   < > 123*  < > 125*  BUN  --   < > 12  < > 22*  CREATININE  --   < > 0.36*  < > 0.54  CALCIUM  --   < > 8.5*  < > 8.8*  MG 1.7  --   --   --   --   AST  --   < > 23  --   --   ALT  --   < > 10*  --   --   ALKPHOS  --   < > 53  --   --   BILITOT  --   < > 0.5  --   --   < > = values in this interval not displayed. ------------------------------------------------------------------------------------------------------------------  Cardiac Enzymes  Recent Labs Lab 11/06/16 1128  TROPONINI 0.03*   ------------------------------------------------------------------------------------------------------------------  RADIOLOGY:  Dg Chest Port 1 View  Result Date: 11/12/2016 CLINICAL DATA:  Respiratory failure. EXAM: PORTABLE CHEST 1 VIEW COMPARISON:  11/11/2016. FINDINGS: Endotracheal tube, NG tube, left IJ line stable position. Heart size normal. Persistent basilar atelectasis and infiltrates, slight interim improvement. Previously questioned density in the left upper lobe is difficult to evaluate due to overlying tubing again follow-up chest x-ray suggested. Prior cervical spine fusion. IMPRESSION: 1.  Lines and tubes in stable position. 2. Persistent bibasilar atelectasis and infiltrates. Slight interim improvement. 3. Previously identified questionable density in the left upper lobe is difficult to identify due to overlying EKG lead and tubing.  Follow-up chest x-ray suggested . Electronically Signed   By: Maisie Fus  Register   On: 11/12/2016 06:51   Dg Chest Port 1 View  Result Date: 11/11/2016 CLINICAL DATA:  Respiratory failure. EXAM: PORTABLE CHEST 1 VIEW COMPARISON:  11/10/2016. FINDINGS: Endotracheal tube, NG tube, left IJ line stable position. Heart size stable. Persistent bibasilar infiltrates without interim change. Small density with possible cavitation in the left upper lobe. Although these changes may be related to mild atelectasis, follow-up exam suggested to exclude developing infiltrate with possible cavitation. Prior cervical spine fusion. IMPRESSION: 1. Lines and tubes in stable position. 2. Persistent bibasilar infiltrates without interim change. 3. Small density with possible cavitation in the left upper lobe. Although these changes may be related to mild atelectasis, follow-up exam suggested to exclude developing infiltrate with possible cavitation. Electronically Signed   By: Maisie Fus  Register   On: 11/11/2016 06:48     ASSESSMENT AND PLAN:   31 year old female with past medical history of COPD, degenerative disc disease, bipolar disorder, hepatitis C, CHF who presented to the hospital due to shortness of breath and noted to be in acute respiratory failure with hypoxia secondary to COPD exacerbation and pneumonia.  1. Acute respiratory failure with hypoxia-secondary to COPD exacerbation due to flu with superimposed Pneumonia. -Now intubated and sedated. Patient is on 50% FiO2 and 8 of PEEP. -Continue vent support and further care as per intensivist.   Failed SBT repeatedly as have agitation on decreasing sedation.  2. COPD exacerbation-secondary to underlying Flu w/ superimpose pneumonia. - giving IV antibiotics with Levaquin.. Cont. Tamiflu.  -- cont. duonebs.   - tracheal aspirate have strep pneumonia on culture. Sensitive to levaquin.  3. Leukocytosis - due to Pneumonia.  - trending down with IV abx.    With fever  again went up.  4. Elevated Trop - due to supply demand ischemia from hypoxia.  - Echo showing Normal LV function.   5. GERD - cont. Pepcid IV  6. Hypomagnesemia - improved and resolved w/ supplementation.   7. Nutrition - cont. Tube feedings and tolerating it.   8. Fever on 11/12/16 morning- sent new cultures, monitor.  All the records are reviewed and case discussed with Care Management/Social Worker. Management plans discussed with the patient, family and they are in agreement.  CODE STATUS: Full code  DVT Prophylaxis: Lovenox  TOTAL TIME TAKING CARE OF THIS PATIENT: 30 minutes.   POSSIBLE D/C ?? DAYS, DEPENDING ON CLINICAL CONDITION.   Altamese Dilling M.D on 11/12/2016 at 9:15 PM  Between 7am to 6pm - Pager - [phone]  After 6pm go to [email or web address] - Scientist, research (life sciences) [facility]  Office  [phone]  CC: Primary care physician; Carlean Jews, NP

## 2016-11-12 NOTE — Progress Notes (Signed)
Nutrition Follow-up  DOCUMENTATION CODES:   Obesity unspecified  INTERVENTION:  Continue TF regimen: VHP @ , Pro-Stat 22mL BID, 200cc free water Q8H  NUTRITION DIAGNOSIS:   Inadequate oral intake related to inability to eat as evidenced by NPO status, other (see comment) (pt ventillated ). -ongoing  GOAL:   Patient will meet greater than or equal to 90% of their needs -meeting  MONITOR:   Vent status, Labs, Weight trends, I & O's, TF tolerance  ASSESSMENT:   31 year old female with past medical history of COPD, degenerative disc disease, bipolar disorder, hepatitis C, CHF who presented to the hospital due to shortness of breath and noted to be in acute respiratory failure with hypoxia secondary to COPD exacerbation and pneumonia.  Patient is currently intubated on ventilator support MV: 10.6 L/min Temp (24hrs), Avg:100.2 F (37.9 C), Min:98.3 F (36.8 C), Max:102.4 F (39.1 C) Propofol: 10.8 ml/hr --> 390.17 calories Watery BM yesterday Labs and medications reviewed: CBGs acceptable, Fentanyl gtt  Diet Order:     Skin:  Reviewed, no issues  Last BM:  11/11/2016  Height:   Ht Readings from Last 1 Encounters:  11/06/16 5\' 4"  (1.626 m)    Weight:   Wt Readings from Last 1 Encounters:  11/12/16 203 lb 0.7 oz (92.1 kg)    Ideal Body Weight:  54.5 kg  BMI:  Body mass index is 34.85 kg/m.  Estimated Nutritional Needs:   Kcal:  1200-1400kcal/day   Protein:  104-122g/day   Fluid:  >1.2L/day   EDUCATION NEEDS:   No education needs identified at this time  01/12/17. Jennifer Gockley, MS, RD LDN Inpatient Clinical Dietitian Pager [phone]

## 2016-11-12 NOTE — Progress Notes (Signed)
Temp 100.8. Given prn acetaminophen per feeding tube.

## 2016-11-12 NOTE — Progress Notes (Signed)
Failed weaning trial this am. Unable to follow commands.  Resumed propofol at . but decreased Fentanyl to 50 mcg. Still not following commands this pm. Max temp. 100.4 this pm.

## 2016-11-13 ENCOUNTER — Inpatient Hospital Stay: Payer: BLUE CROSS/BLUE SHIELD

## 2016-11-13 DIAGNOSIS — R062 Wheezing: Secondary | ICD-10-CM

## 2016-11-13 LAB — BASIC METABOLIC PANEL
Anion gap: 8 (ref 5–15)
BUN: 20 mg/dL (ref 6–20)
CALCIUM: 8.7 mg/dL — AB (ref 8.9–10.3)
CO2: 33 mmol/L — AB (ref 22–32)
CREATININE: 0.42 mg/dL — AB (ref 0.44–1.00)
Chloride: 99 mmol/L — ABNORMAL LOW (ref 101–111)
GFR calc non Af Amer: >60 mL/min (ref >60)
GLUCOSE: 118 mg/dL — AB (ref 65–99)
Potassium: 3.6 mmol/L (ref 3.5–5.1)
Sodium: 140 mmol/L (ref 135–145)

## 2016-11-13 LAB — URINE CULTURE: Culture: >=100000 — AB

## 2016-11-13 LAB — PHOSPHORUS: PHOSPHORUS: 3.8 mg/dL (ref 2.5–4.6)

## 2016-11-13 LAB — MAGNESIUM: Magnesium: 1.9 mg/dL (ref 1.7–2.4)

## 2016-11-13 LAB — PROCALCITONIN: Procalcitonin: <0.1 ng/mL

## 2016-11-13 LAB — CBC
HCT: 30 % — ABNORMAL LOW (ref 35.0–47.0)
Hemoglobin: 9.9 g/dL — ABNORMAL LOW (ref 12.0–16.0)
MCH: 31.8 pg (ref 26.0–34.0)
MCHC: 33.1 g/dL (ref 32.0–36.0)
MCV: 96 fL (ref 80.0–100.0)
PLATELETS: 304 10*3/uL (ref 150–440)
RBC: 3.12 MIL/uL — AB (ref 3.80–5.20)
RDW: 13.5 % (ref 11.5–14.5)
WBC: 13.2 10*3/uL — ABNORMAL HIGH (ref 3.6–11.0)

## 2016-11-13 LAB — GLUCOSE, CAPILLARY
Glucose-Capillary: 108 mg/dL — ABNORMAL HIGH (ref 65–99)
Glucose-Capillary: 117 mg/dL — ABNORMAL HIGH (ref 65–99)

## 2016-11-13 MED ORDER — ALBUTEROL SULFATE (2.5 MG/3ML) 0.083% IN NEBU
2.5000 mg | INHALATION_SOLUTION | RESPIRATORY_TRACT | Status: DC | PRN
  Administered 2016-11-13 (×2): 2.5 mg via RESPIRATORY_TRACT
  Filled 2016-11-13 (×2): qty 3

## 2016-11-13 MED ORDER — DOCUSATE SODIUM 50 MG/5ML PO LIQD: 100.0000 mg | Freq: Two times a day (BID) | ORAL | Status: DC | PRN

## 2016-11-13 MED ORDER — FENTANYL CITRATE (PF) 100 MCG/2ML IJ SOLN: 25.0000 ug | INTRAMUSCULAR | Status: DC | PRN

## 2016-11-13 MED ORDER — LORAZEPAM 2 MG/ML IJ SOLN
0.5000 mg | INTRAMUSCULAR | Status: DC | PRN
  Administered 2016-11-13 (×2): 1 mg via INTRAVENOUS
  Filled 2016-11-13 (×2): qty 1

## 2016-11-13 MED ORDER — ORAL CARE MOUTH RINSE
15.0000 mL | Freq: Two times a day (BID) | OROMUCOSAL | Status: DC
  Administered 2016-11-13: 15 mL via OROMUCOSAL

## 2016-11-13 MED ORDER — KCL-LACTATED RINGERS-D5W 20 MEQ/L IV SOLN
INTRAVENOUS | Status: DC
  Administered 2016-11-13: 10:00:00 via INTRAVENOUS
  Administered 2016-11-14: 50 mL/h via INTRAVENOUS
  Filled 2016-11-13 (×3): qty 1000

## 2016-11-13 MED ORDER — ACETAMINOPHEN 160 MG/5ML PO SOLN
650.0000 mg | ORAL | Status: DC | PRN
  Filled 2016-11-13: qty 20.3

## 2016-11-13 NOTE — Progress Notes (Signed)
Patient coughing, not synchronized with vent. Versed IVP given.

## 2016-11-13 NOTE — Plan of Care (Signed)
Problem: Pain Managment: Goal: General experience of comfort will improve Outcome: Not Progressing Required Fentanyl bolus  X's 3 after turning due to increased agitation  Problem: Physical Regulation: Goal: Ability to maintain clinical measurements within normal limits will improve Outcome: Progressing Vital sing WNL Goal: Will remain free from infection Outcome: Not Progressing Continues to run low grade temp

## 2016-11-13 NOTE — Progress Notes (Signed)
PULMONARY / CRITICAL CARE MEDICINE   Name: Jennifer Vasquez MRN: [id number] DOB: 16-Sep-1985    ADMISSION DATE:  11/05/2016  PT PROFILE:   31 y.o. F smoker with history of polysubstance abuse, bipolar disorder, obstructive lung disease, hepatitis C virus, chronic pain syndrome presented to emergency department with respiratory distress, fever, cough. Nasal swab positive for influenza B. Failed attempted noninvasive ventilation and was intubated in the emergency department.  MAJOR EVENTS/TEST RESULTS: 04/28 echocardiogram: Normal systolic function, LVEF 55-65%, left atrial size at the upper limits of normal 05/01 Worsening bilateral pulmonary infiltrates c/w ARDS. Lung protection vent strategy initiated 05/02 increasing edema - furosemide X 2 doses 05/03 Repeat furosemide. Agitation improving 05/04 Isolated fever. Recultured. Failed SBT. Tolerated PSV 14 cm H2O 05/05 past spontaneous breathing trial, followed commands. Extubated. Subsequently, developed increased work of breathing and BiPAP ordered.  INDWELLING DEVICES:: ETT 04/27 >> 05/05 L IJ CVL 05/02 >>   MICRO DATA: Flu PCR 04/28 >> POS for influenza B MRSA PCR 04/28 >> NEG Urine 04/27 >> NEG Resp 04/28 >> pneumococcus Blood 04/27 >> NEG Urine 05/04 >> yeast Resp 05/04 >>  Blood 05/04 >>    ANTIMICROBIALS:  Vancomycin 04/27 >> 04/30 Pip-tazo 04/28 >> 04/30 Oseltamivir 04/28 >> 05/02 Levofloxacin 04/27 >> 05/03   SUBJECTIVE:  Failed SBT. Calmer on WUA. Not F/C. Tolerating PSV 14 cm H2O  VITAL SIGNS: BP (!) 143/86   Pulse (!) 112   Temp 98.8 F (37.1 C)   Resp (!) 27   Ht 5\' 4"  (1.626 m)   Wt 216 lb 11.4 oz (98.3 kg)   SpO2 95%   BMI 37.20 kg/m   HEMODYNAMICS:    VENTILATOR SETTINGS: Vent Mode: PSV;CPAP FiO2 (%):  [28 %-35 %] 28 % PEEP:  [5 cmH20] 5 cmH20 Pressure Support:  [5 cmH20-14 cmH20] 5 cmH20  INTAKE / OUTPUT: I/O last 3 completed shifts: In: 3291.2 [I.V.:751.2; NG/GT:2390; IV  Piggyback:150] Out: 2610 [Urine:2610]  PHYSICAL EXAMINATION: General:  RASS 0, + F/C Neuro: CNs intact, moves all extremities  HEENT: NCAT, sclerae white Cardiovascular: Regular, no murmurs Lungs: Scattered wheezes Abdomen: Obese, soft, diminished bowel sounds Extremities: Warm, no edema Skin: No lesions noted  LABS:  BMET  Recent Labs Lab 11/11/16 0347 11/12/16 0440 11/13/16 0440  NA 138 140 140  K 3.7 3.6 3.6  CL 97* 97* 99*  CO2 37* 34* 33*  BUN 16 22* 20  CREATININE 0.61 0.54 0.42*  GLUCOSE 104* 125* 118*    Electrolytes  Recent Labs Lab 11/07/16 1512 11/08/16 0456  11/11/16 0347 11/12/16 0440 11/13/16 0440  CALCIUM  --   --   < > 8.7* 8.8* 8.7*  MG 2.1 1.7  --   --   --  1.9  PHOS 3.9 3.5  --   --   --  3.8  < > = values in this interval not displayed.  CBC  Recent Labs Lab 11/11/16 0347 11/12/16 0440 11/13/16 0440  WBC 8.6 14.8* 13.2*  HGB 9.3* 10.4* 9.9*  HCT 27.5* 30.7* 30.0*  PLT 264 307 304    Coag's No results for input(s): APTT, INR in the last 168 hours.  Sepsis Markers  Recent Labs Lab 11/12/16 0440 11/13/16 0440  PROCALCITON 0.11 <0.10    ABG No results for input(s): PHART, PCO2ART, PO2ART in the last 168 hours.  Liver Enzymes  Recent Labs Lab 11/09/16 0418 11/10/16 0605  AST 37 23  ALT 11* 10*  ALKPHOS 55 53  BILITOT 0.6  0.5  ALBUMIN 2.6* 2.4*    Cardiac Enzymes No results for input(s): TROPONINI, PROBNP in the last 168 hours.  Glucose  Recent Labs Lab 11/11/16 1638 11/11/16 2343 11/12/16 0721 11/12/16 1626 11/12/16 2356 11/13/16 0732  GLUCAP 125* 116* 123* 109* 111* 108*    CXR: overall improved B infiltrates. RLL atx/infiltrrate   ASSESSMENT / PLAN:  PULMONARY A: Acute hypoxemic respiratory failure Influenza B pneumonitis ARDS, resolving Smoker Wheezing P:   Extubated today under my direction Supplemental oxygen to maintain SPO2 greater than 90% Continue nebulized steroids and  bronchodilators Avoid systemic steroids for now BiPAP PRN  CARDIOVASCULAR A:  Sinus tachycardia - resolved P:  Monitor rhythm, blood pressure  RENAL A:   Hypervolemia - resolved P:   Monitor BMET intermittently Monitor I/Os Correct electrolytes as indicated   GASTROINTESTINAL A:   No acute issues P:   SUP: Not indicated after extubation NPO until respiratory status improves  HEMATOLOGIC A:   Mild anemia without acute blood loss P:  DVT px: Enoxaparin Monitor CBC intermittently Transfuse per usual guidelines  INFECTIOUS A:   Influenza pneumonitis Pneumococcal pneumonia Isolated fever without definite infectious source  Normal PCT is reassuring P:   Monitor temp, WBC count Micro and abx as above  ENDOCRINE A:   Mild hyperglycemia without history of diabetes P:   Monitor glucose on chemistry panels Consider SSI for glucose greater than 180  NEUROLOGIC A:   Polysubstance abuse Severe agitated delirium ICU/ventilator associated discomfort P:   RASS goal: 0 Scheduled quetiapine and clonazepam discontinued Low dose PRN fentanyl Low dose PRN lorazepam  FAMILY  No family at bedside  CCM time: 35 mins The above time includes time spent in consultation with patient and/or family members and reviewing care plan on multidisciplinary rounds  Billy Fischer, MD PCCM service Mobile [phone] Pager [phone] 11/13/2016 2:29 PM

## 2016-11-13 NOTE — Plan of Care (Signed)
Problem: Respiratory: Goal: Ability to maintain a clear airway and adequate ventilation will improve Outcome: Progressing Breath sounds clear, diminished

## 2016-11-13 NOTE — Progress Notes (Signed)
Pt extubated without complilcations, placed on 5lpm [place], sats 96%, respiratory rate 20/min, will continue to monitor

## 2016-11-13 NOTE — Progress Notes (Signed)
Extubated onto 5L [place], Dr. Sung Amabile, RT, and family at bedside

## 2016-11-13 NOTE — Plan of Care (Signed)
Problem: Tissue Perfusion: Goal: Risk factors for ineffective tissue perfusion will decrease Outcome: Progressing Remains on spontaneous ventilation.  SPO2 WNL.    Problem: Fluid Volume: Goal: Ability to maintain a balanced intake and output will improve Outcome: Progressing Urine output adequate  Problem: Nutrition: Goal: Adequate nutrition will be maintained Outcome: Progressing Remains on Vital high protein.  Free H20 every 8 hrs  Problem: Respiratory: Goal: Ability to maintain a clear airway and adequate ventilation will improve Outcome: Not Progressing Patient with copious amount of oral secretions.  Strong cough noted  Problem: Skin Integrity Impairment Risk: Goal: Risk for impaired skin integrity will decrease Outcome: Progressing Patient has been turned every 2 hours.  No skin breakdown noted.

## 2016-11-13 NOTE — Progress Notes (Signed)
Patient continues to become agitated with turning and mouth care.  Fentanyl bolus given.

## 2016-11-13 NOTE — Progress Notes (Signed)
Patient requires fentanyl bolus after turning and mouth care due to agitation.

## 2016-11-13 NOTE — Progress Notes (Signed)
[employer] [facility] - [place] at [facility]   PATIENT NAME: Jennifer Vasquez    MR#:  [id number]  DATE OF BIRTH:  Aug 11, 1985  SUBJECTIVE:   Patient here due to shortness of breath and acute respiratory failure with hypoxia secondary to COPD exacerbation with pneumonia. Patient intubated in the ER and currently remains intubated and sedated. Remains on sedatives w/ intermittent use of paralytics due to agitation. Could not try SBT. Pt had drug abuse history, so difficult trials sue to need for sedation. Had fever 11/12/16 early morning. After multiple days of failed extubation trial, finally extubated today seen immediately postextubation.   She was still somewhat sleepy and having some wheezing.  REVIEW OF SYSTEMS:    Review of Systems   Patient is just extubated and having some wheezing and using nebulizer treatment and slightly sleepy so not talking much to me.  Nutrition: Tube feeds Tolerating Diet: Yes Tolerating PT: Await Eval once extubated.   DRUG ALLERGIES:   Allergies  Allergen Reactions  . Acetaminophen     Other reaction(s): Other (qualifier value) Due to hep c  . Amoxicillin-Pot Clavulanate Diarrhea and Nausea And Vomiting  . Gabapentin Hives  . Tetanus Toxoids   . Tramadol Hives  . Shellfish Allergy Rash    oysters    VITALS:  Blood pressure 129/80, pulse (!) 110, temperature 98.6 F (37 C), resp. rate (!) 25, height 5\' 4"  (1.626 m), weight 98.3 kg (216 lb 11.4 oz), SpO2 95 %.  PHYSICAL EXAMINATION:   Physical Exam  GENERAL:  31 y.o.-year-old critically ill patient lying in bed . EYES: Pupils equal, round, reactive to light. No scleral icterus. Extraocular muscles intact.  HEENT: Head atraumatic, normocephalic. Oropharynx and nasopharynx clear.  NECK:  Supple, no jugular venous distention. No thyroid enlargement, no tenderness.  LUNGS: Normal breath sounds bilaterally, some wheezing, no creps. No use of accessory muscles of respiration. Extubated  now.  CARDIOVASCULAR: S1, S2 normal. No murmurs, rubs, or gallops.  ABDOMEN: Soft, nontender, nondistended. Bowel sounds present. No organomegaly or mass.  EXTREMITIES: No cyanosis, clubbing or edema b/l.    NEUROLOGIC: Patient was just extubated and so she was slightly drowsy and having some wheezing so preferred to keep her eyes closed and not talk much with me so was not following commands. PSYCHIATRIC: Unable to assess as mentioned above.  SKIN: No obvious rash, lesion, or ulcer.    LABORATORY PANEL:   CBC  Recent Labs Lab 11/13/16 0440  WBC 13.2*  HGB 9.9*  HCT 30.0*  PLT 304   ------------------------------------------------------------------------------------------------------------------  Chemistries   Recent Labs Lab 11/10/16 0605  11/13/16 0440  NA 138  < > 140  K 3.8  < > 3.6  CL 98*  < > 99*  CO2 35*  < > 33*  GLUCOSE 123*  < > 118*  BUN 12  < > 20  CREATININE 0.36*  < > 0.42*  CALCIUM 8.5*  < > 8.7*  MG  --   --  1.9  AST 23  --   --   ALT 10*  --   --   ALKPHOS 53  --   --   BILITOT 0.5  --   --   < > = values in this interval not displayed. ------------------------------------------------------------------------------------------------------------------  Cardiac Enzymes No results for input(s): TROPONINI in the last 168 hours. ------------------------------------------------------------------------------------------------------------------  RADIOLOGY:  Dg Chest Port 1 View  Result Date: 11/13/2016 CLINICAL DATA:  Respiratory failure. EXAM: PORTABLE CHEST 1 VIEW COMPARISON:  Radiograph of Nov 12, 2016. FINDINGS: Stable cardiomediastinal silhouette. Endotracheal and nasogastric tubes are unchanged position. Left internal jugular catheter is again noted with distal tip in expected position of the SVC. No pneumothorax is noted. Left lung is clear. Mild right basilar atelectasis or infiltrate is noted with possible associated pleural effusion. Bony thorax  is unremarkable. IMPRESSION: Stable support apparatus. Mild right basilar atelectasis or infiltrate is noted with possible associated pleural effusion. Electronically Signed   By: Lupita Raider, M.D.   On: 11/13/2016 07:09   Dg Chest Port 1 View  Result Date: 11/12/2016 CLINICAL DATA:  Respiratory failure. EXAM: PORTABLE CHEST 1 VIEW COMPARISON:  11/11/2016. FINDINGS: Endotracheal tube, NG tube, left IJ line stable position. Heart size normal. Persistent basilar atelectasis and infiltrates, slight interim improvement. Previously questioned density in the left upper lobe is difficult to evaluate due to overlying tubing again follow-up chest x-ray suggested. Prior cervical spine fusion. IMPRESSION: 1.  Lines and tubes in stable position. 2. Persistent bibasilar atelectasis and infiltrates. Slight interim improvement. 3. Previously identified questionable density in the left upper lobe is difficult to identify due to overlying EKG lead and tubing. Follow-up chest x-ray suggested . Electronically Signed   By: Maisie Fus  Register   On: 11/12/2016 06:51     ASSESSMENT AND PLAN:   31 year old female with past medical history of COPD, degenerative disc disease, bipolar disorder, hepatitis C, CHF who presented to the hospital due to shortness of breath and noted to be in acute respiratory failure with hypoxia secondary to COPD exacerbation and pneumonia.  1. Acute respiratory failure with hypoxia-secondary to COPD exacerbation due to flu with superimposed Pneumonia. -Now intubated and sedated. Patient is on 50% FiO2 and 8 of PEEP. -Continue vent support and further care as per intensivist.   - finally extubated 11/13/16.  2. COPD exacerbation-secondary to underlying Flu w/ superimpose pneumonia. - giving IV antibiotics with Levaquin. Cont. Tamiflu.  -- cont. duonebs.   - tracheal aspirate have strep pneumonia on culture. Sensitive to levaquin.  3. Leukocytosis - due to Pneumonia.  - trending down with IV  abx.    With fever again ( 11/12/16) went up.  4. Elevated Trop - due to supply demand ischemia from hypoxia.  - Echo showing Normal LV function.   5. GERD - cont. Pepcid IV  6. Hypomagnesemia - improved and resolved w/ supplementation.   7. Nutrition - cont. Tube feedings and tolerating it.   8. Fever on 11/12/16 morning- sent new cultures, monitor.  All the records are reviewed and case discussed with Care Management/Social Worker. Management plans discussed with the patient, family and they are in agreement.  CODE STATUS: Full code  DVT Prophylaxis: Lovenox  TOTAL TIME TAKING CARE OF THIS PATIENT: 30 minutes.   POSSIBLE D/C ?? DAYS, DEPENDING ON CLINICAL CONDITION.   Altamese Dilling M.D on 11/13/2016 at 1:39 PM  Between 7am to 6pm - Pager - [phone]  After 6pm go to [email or web address] - Scientist, research (life sciences)  [facility]  Office  [phone]  CC: Primary care physician; Carlean Jews, NP

## 2016-11-14 DIAGNOSIS — R5381 Other malaise: Secondary | ICD-10-CM

## 2016-11-14 LAB — CBC
HCT: 29.5 % — ABNORMAL LOW (ref 35.0–47.0)
Hemoglobin: 9.9 g/dL — ABNORMAL LOW (ref 12.0–16.0)
MCH: 31.8 pg (ref 26.0–34.0)
MCHC: 33.5 g/dL (ref 32.0–36.0)
MCV: 94.9 fL (ref 80.0–100.0)
PLATELETS: 303 10*3/uL (ref 150–440)
RBC: 3.11 MIL/uL — AB (ref 3.80–5.20)
RDW: 12.9 % (ref 11.5–14.5)
WBC: 13.9 10*3/uL — ABNORMAL HIGH (ref 3.6–11.0)

## 2016-11-14 LAB — CULTURE, RESPIRATORY W GRAM STAIN: Culture: NORMAL

## 2016-11-14 LAB — BASIC METABOLIC PANEL
Anion gap: 5 (ref 5–15)
BUN: 13 mg/dL (ref 6–20)
CALCIUM: 8.7 mg/dL — AB (ref 8.9–10.3)
CHLORIDE: 103 mmol/L (ref 101–111)
CO2: 32 mmol/L (ref 22–32)
CREATININE: 0.4 mg/dL — AB (ref 0.44–1.00)
GFR calc non Af Amer: >60 mL/min (ref >60)
GLUCOSE: 119 mg/dL — AB (ref 65–99)
Potassium: 3.3 mmol/L — ABNORMAL LOW (ref 3.5–5.1)
Sodium: 140 mmol/L (ref 135–145)

## 2016-11-14 LAB — CULTURE, RESPIRATORY

## 2016-11-14 LAB — PROCALCITONIN: Procalcitonin: <0.1 ng/mL

## 2016-11-14 LAB — GLUCOSE, CAPILLARY: Glucose-Capillary: 118 mg/dL — ABNORMAL HIGH (ref 65–99)

## 2016-11-14 MED ORDER — FENTANYL CITRATE (PF) 100 MCG/2ML IJ SOLN: 25.0000 ug | INTRAMUSCULAR | Status: DC | PRN

## 2016-11-14 MED ORDER — POTASSIUM CHLORIDE CRYS ER 20 MEQ PO TBCR
40.0000 meq | EXTENDED_RELEASE_TABLET | Freq: Once | ORAL | Status: AC
  Administered 2016-11-14: 40 meq via ORAL
  Filled 2016-11-14: qty 2

## 2016-11-14 MED ORDER — NYSTATIN 100000 UNIT/ML MT SUSP
5.0000 mL | Freq: Four times a day (QID) | OROMUCOSAL | Status: DC
  Administered 2016-11-14 – 2016-11-15 (×2): 500000 [IU] via OROMUCOSAL
  Filled 2016-11-14 (×2): qty 5

## 2016-11-14 MED ORDER — TRAZODONE HCL 100 MG PO TABS
100.0000 mg | ORAL_TABLET | Freq: Every day | ORAL | Status: DC
  Administered 2016-11-14: 100 mg via ORAL
  Filled 2016-11-14: qty 1

## 2016-11-14 MED ORDER — POTASSIUM CHLORIDE 10 MEQ/50ML IV SOLN
10.0000 meq | INTRAVENOUS | Status: AC
  Administered 2016-11-14 (×4): 10 meq via INTRAVENOUS
  Filled 2016-11-14 (×4): qty 50

## 2016-11-14 NOTE — Progress Notes (Addendum)
[employer] - [place] at [facility]   PATIENT NAME: Jennifer Vasquez    MR#:  [id number]  DATE OF BIRTH:  1986/06/10  SUBJECTIVE:   Patient here due to shortness of breath and acute respiratory failure with hypoxia secondary to COPD exacerbation with pneumonia. Patient intubated in the ER and currently remains intubated and sedated. Remains on sedatives w/ intermittent use of paralytics due to agitation. Could not try SBT. Pt had drug abuse history, so difficult trials sue to need for sedation. Had fever 11/12/16 early morning. After multiple days of failed extubation trial, finally extubated 11/13/16.  MORE ALERT, on nasal canula oxygen now.  REVIEW OF SYSTEMS:    Review of Systems  Constitutional: Negative for chills, fever and malaise/fatigue.  HENT: Positive for sore throat. Negative for congestion, ear pain, hearing loss and nosebleeds.   Eyes: Negative for blurred vision and double vision.  Respiratory: Positive for cough. Negative for hemoptysis, sputum production and shortness of breath.   Cardiovascular: Negative for chest pain, palpitations, orthopnea and leg swelling.  Gastrointestinal: Negative for abdominal pain, heartburn, nausea and vomiting.  Genitourinary: Negative for dysuria, frequency and urgency.  Musculoskeletal: Negative for back pain and myalgias.  Skin: Negative for itching and rash.  Neurological: Negative for dizziness, tingling, tremors and headaches.  Psychiatric/Behavioral: Negative for depression and suicidal ideas.     DRUG ALLERGIES:   Allergies  Allergen Reactions  . Acetaminophen     Other reaction(s): Other (qualifier value) Due to hep c  . Amoxicillin-Pot Clavulanate Diarrhea and Nausea And Vomiting  . Gabapentin Hives  . Tetanus Toxoids   . Tramadol Hives  . Shellfish Allergy Rash    oysters    VITALS:  Blood pressure (!) 145/80, pulse (!) 101, temperature 99.5 F (37.5 C), temperature source Oral, resp. rate 18, height 5'  4" (1.626 m), weight 98.3 kg (216 lb 11.4 oz), SpO2 100 %.  PHYSICAL EXAMINATION:   Physical Exam  GENERAL:  31 y.o.-year-old critically ill patient lying in bed . EYES: Pupils equal, round, reactive to light. No scleral icterus. Extraocular muscles intact.  HEENT: Head atraumatic, normocephalic. Oropharynx and nasopharynx clear.  NECK:  Supple, no jugular venous distention. No thyroid enlargement, no tenderness.  LUNGS: Normal breath sounds bilaterally, some wheezing, no creps. No use of accessory muscles of respiration. Extubated now. On nasal canula oxygen. CARDIOVASCULAR: S1, S2 normal. No murmurs, rubs, or gallops.  ABDOMEN: Soft, nontender, nondistended. Bowel sounds present. No organomegaly or mass.  EXTREMITIES: No cyanosis, clubbing or edema b/l.    NEUROLOGIC: cranial nerves intact, follows commands, power 4/5 all limbs. gait not checked PSYCHIATRIC: alert and oriented.  SKIN: No obvious rash, lesion, or ulcer.    LABORATORY PANEL:   CBC  Recent Labs Lab 11/14/16 0512  WBC 13.9*  HGB 9.9*  HCT 29.5*  PLT 303   ------------------------------------------------------------------------------------------------------------------  Chemistries   Recent Labs Lab 11/10/16 0605  11/13/16 0440 11/14/16 0512  NA 138  < > 140 140  K 3.8  < > 3.6 3.3*  CL 98*  < > 99* 103  CO2 35*  < > 33* 32  GLUCOSE 123*  < > 118* 119*  BUN 12  < > 20 13  CREATININE 0.36*  < > 0.42* 0.40*  CALCIUM 8.5*  < > 8.7* 8.7*  MG  --   --  1.9  --   AST 23  --   --   --   ALT 10*  --   --   --  ALKPHOS 53  --   --   --   BILITOT 0.5  --   --   --   < > = values in this interval not displayed. ------------------------------------------------------------------------------------------------------------------  Cardiac Enzymes No results for input(s): TROPONINI in the last 168  hours. ------------------------------------------------------------------------------------------------------------------  RADIOLOGY:  Dg Chest Port 1 View  Result Date: 11/13/2016 CLINICAL DATA:  Respiratory failure. EXAM: PORTABLE CHEST 1 VIEW COMPARISON:  Radiograph of Nov 12, 2016. FINDINGS: Stable cardiomediastinal silhouette. Endotracheal and nasogastric tubes are unchanged position. Left internal jugular catheter is again noted with distal tip in expected position of the SVC. No pneumothorax is noted. Left lung is clear. Mild right basilar atelectasis or infiltrate is noted with possible associated pleural effusion. Bony thorax is unremarkable. IMPRESSION: Stable support apparatus. Mild right basilar atelectasis or infiltrate is noted with possible associated pleural effusion. Electronically Signed   By: Lupita Raider, M.D.   On: 11/13/2016 07:09     ASSESSMENT AND PLAN:   31 year old female with past medical history of COPD, degenerative disc disease, bipolar disorder, hepatitis C, CHF who presented to the hospital due to shortness of breath and noted to be in acute respiratory failure with hypoxia secondary to COPD exacerbation and pneumonia.  1. Acute respiratory failure with hypoxia-secondary to COPD exacerbation due to flu with superimposed Pneumonia. -Intubated for few days.   - finally extubated 11/13/16. - try to taper oxygen to room air.  2. COPD exacerbation-secondary to underlying Flu w/ superimpose pneumonia. - giving IV antibiotics with Levaquin ( Finished 7 days). given Tamiflu( finished 5 days).  -- cont. duonebs.   - tracheal aspirate have strep pneumonia on culture. Sensitive to levaquin.  3. Leukocytosis - due to Pneumonia.  - trending down with IV abx.    With fever again ( 11/12/16) went up. But remains afebrile and cx are negative.  4. Elevated Trop - due to supply demand ischemia from hypoxia.  - Echo showing Normal LV function.   5. GERD - cont. Pepcid  IV  6. Hypomagnesemia - improved and resolved w/ supplementation.   7. Nutrition - cont. Tube feedings and tolerating it.   8. Fever on 11/12/16 morning- sent new cultures, monitor. No fever now.  9. Substance abuse- call psych eval.  All the records are reviewed and case discussed with Care Management/Social Worker. Management plans discussed with the patient, family and they are in agreement.  CODE STATUS: Full code  DVT Prophylaxis: Lovenox  TOTAL TIME TAKING CARE OF THIS PATIENT: 30 minutes.   POSSIBLE D/C ?? DAYS, DEPENDING ON CLINICAL CONDITION.   Altamese Dilling M.D on 11/14/2016 at 10:09 PM  Between 7am to 6pm - Pager - [phone]  After 6pm go to [email or web address] - Scientist, research (life sciences) [facility]  Office  [phone]  CC: Primary care physician; Carlean Jews, NP

## 2016-11-14 NOTE — Progress Notes (Signed)
Patient remains confused.  Thinks she is at a house instead of hospital.  Pulls medical equipment off.  Reoriented. Emotional support given.

## 2016-11-14 NOTE — Evaluation (Signed)
Physical Therapy Evaluation Patient Details Name: Jennifer Vasquez MRN: [id number] DOB: 1986/05/26 Today's Date: 11/14/2016   History of Present Illness  31 yo female with onset of influenza B was intubated 11/05/16 and extubated 11/13/16, has pleural effusion and IC patient.  PMHx:  smoker, polysubstance abuse, bipolar, COPD, Hep C, chronic pain,    Clinical Impression  Pt is up to stand for the first time since admission, very weak in LE's but able to assist with the activity.  Her plan is to get her up to walk and sit OOB in chair as much as possible to allow her to get home with HHPT.  Her family care is not known, but if insufficient help for 24/7 assistance is not available will need to transition to SNF.  However, acutely will work on strengthening and balance, progress her to more challenging gait such as the stairs as able.    Follow Up Recommendations Home health PT;Supervision/Assistance - 24 hour    Equipment Recommendations  Rolling walker with 5" wheels    Recommendations for Other Services       Precautions / Restrictions Precautions Precautions: Fall (telemetry) Restrictions Weight Bearing Restrictions: No      Mobility  Bed Mobility Overal bed mobility: Needs Assistance Bed Mobility: Supine to Sit;Sit to Supine     Supine to sit: Mod assist Sit to supine: Max assist   General bed mobility comments: max to get back to bed after standing and stepping side of bed  Transfers Overall transfer level: Needs assistance Equipment used: 1 person hand held assist Transfers: Sit to/from Stand Sit to Stand: Mod assist         General transfer comment: pt can help stand but return to bed difficult as she is not able to push back to sit back on bed with legs  Ambulation/Gait Ambulation/Gait assistance: Min assist Ambulation Distance (Feet): 5 Feet Assistive device: 1 person hand held assist Gait Pattern/deviations: Step-to pattern;Decreased stride length;Shuffle;Wide  base of support;Trunk flexed Gait velocity: reduced Gait velocity interpretation: Below normal speed for age/gender General Gait Details: short shuffled steps up side of bed with poor control from hips  Stairs            Wheelchair Mobility    Modified Rankin (Stroke Patients Only)       Balance Overall balance assessment: Needs assistance Sitting-balance support: Feet supported Sitting balance-Leahy Scale: Fair     Standing balance support: Single extremity supported Standing balance-Leahy Scale: Poor                               Pertinent Vitals/Pain Pain Assessment: Faces Faces Pain Scale: Hurts a little bit Pain Location: L hip Pain Descriptors / Indicators: Sore Pain Intervention(s): Monitored during session;Repositioned    Home Living Family/patient expects to be discharged to:: Private residence Living Arrangements: Children Available Help at Discharge: Family;Available PRN/intermittently Type of Home: House Home Access: Stairs to enter Entrance Stairs-Rails: Right;Left;Can reach both Entrance Stairs-Number of Steps: 3 Home Layout: One level Home Equipment: Cane - single point      Prior Function Level of Independence: Independent with assistive device(s)         Comments: no longer drives     Hand Dominance   Dominant Hand: Right    Extremity/Trunk Assessment   Upper Extremity Assessment Upper Extremity Assessment: Generalized weakness    Lower Extremity Assessment Lower Extremity Assessment: Generalized weakness    Cervical /  Trunk Assessment Cervical / Trunk Assessment: Normal  Communication   Communication: No difficulties  Cognition Arousal/Alertness: Awake/alert Behavior During Therapy: Flat affect Overall Cognitive Status: No family/caregiver present to determine baseline cognitive functioning                                 General Comments: pt is slow to respond to questions and not clear on  how much was pre-existing      General Comments General comments (skin integrity, edema, etc.): Pt reports some chronic pain on L hip which needs surgery but cannot elaborate on it.  Reports her neck had all the discs removed and replaced.    Exercises     Assessment/Plan    PT Assessment Patient needs continued PT services  PT Problem List Decreased strength;Decreased range of motion;Decreased activity tolerance;Decreased balance;Decreased mobility;Decreased coordination;Decreased cognition;Decreased knowledge of use of DME;Decreased safety awareness;Cardiopulmonary status limiting activity;Obesity;Decreased skin integrity       PT Treatment Interventions DME instruction;Gait training;Stair training;Functional mobility training;Therapeutic activities;Therapeutic exercise;Balance training;Neuromuscular re-education;Patient/family education    PT Goals (Current goals can be found in the Care Plan section)  Acute Rehab PT Goals Patient Stated Goal: to get back to walking again PT Goal Formulation: With patient Time For Goal Achievement: 11/28/16 Potential to Achieve Goals: Good    Frequency Min 2X/week   Barriers to discharge Inaccessible home environment;Decreased caregiver support lives with her children who may not be old enough to assist her    Co-evaluation               AM-PAC PT "6 Clicks" Daily Activity  Outcome Measure Difficulty turning over in bed (including adjusting bedclothes, sheets and blankets)?: Total Difficulty moving from lying on back to sitting on the side of the bed? : Total Difficulty sitting down on and standing up from a chair with arms (e.g., wheelchair, bedside commode, etc,.)?: Total Help needed moving to and from a bed to chair (including a wheelchair)?: A Lot Help needed walking in hospital room?: A Lot Help needed climbing 3-5 steps with a railing? : Total 6 Click Score: 8    End of Session Equipment Utilized During Treatment:  Oxygen Activity Tolerance: Patient tolerated treatment well;Patient limited by fatigue;Patient limited by lethargy Patient left: in bed;with call bell/phone within reach;with bed alarm set;with nursing/sitter in room Nurse Communication: Mobility status PT Visit Diagnosis: Unsteadiness on feet (R26.81);Muscle weakness (generalized) (M62.81);Difficulty in walking, not elsewhere classified (R26.2)    Time: 3428-7681 PT Time Calculation (min) (ACUTE ONLY): 18 min   Charges:   PT Evaluation $PT Eval Moderate Complexity: 1 Procedure PT Treatments $Therapeutic Activity: 8-22 mins   PT G Codes:   PT G-Codes **NOT FOR INPATIENT CLASS** Functional Assessment Tool Used: AM-PAC 6 Clicks Basic Mobility     Ivar Drape 11/14/2016, 4:05 PM   Samul Dada, PT MS Acute Rehab Dept. Number: [facility] [id number] and [facility] [phone]

## 2016-11-14 NOTE — Progress Notes (Signed)
Patient arrived from ICU.  She is confused about how the last few days transpired.  Friend at bedside and supportive

## 2016-11-14 NOTE — Progress Notes (Signed)
Pt transferring to 2C. She is aware and agreeable to transfer. Report called to receiving nurse.

## 2016-11-14 NOTE — Progress Notes (Signed)
Pt decided that she needed breathing treatment and treatment was given

## 2016-11-14 NOTE — Progress Notes (Signed)
Pt is refusing her breathing treatments. She states that she is concerned the she'll start to depend on breathing treatments

## 2016-11-14 NOTE — Progress Notes (Signed)
eLink Physician-Brief Progress Note Patient Name: INZA MIKRUT DOB: 1985/11/27 MRN: [id number]   Date of Service  11/14/2016  HPI/Events of Note  Hypokalemia  eICU Interventions  Potassium replaced     Intervention Category Intermediate Interventions: Electrolyte abnormality - evaluation and management  DETERDING,ELIZABETH 11/14/2016, 6:47 AM

## 2016-11-14 NOTE — Progress Notes (Addendum)
PULMONARY / CRITICAL CARE MEDICINE   Name: Jennifer Vasquez MRN: [id number] DOB: [birth year]    ADMISSION DATE:  11/05/2016  PT PROFILE:   31 y.o. F smoker with history of polysubstance abuse, bipolar disorder, obstructive lung disease, hepatitis C virus, chronic pain syndrome presented to emergency department with respiratory distress, fever, cough. Nasal swab positive for influenza B. Failed attempted noninvasive ventilation and was intubated in the emergency department.  MAJOR EVENTS/TEST RESULTS:  04/27 Admitted as above.  04/28 Echocardiogram: Normal systolic function, LVEF 55-65%, left atrial size at the upper limits of normal 05/01 Worsening bilateral pulmonary infiltrates c/w ARDS. Lung protection vent strategy initiated 05/02 increasing edema - furosemide X 2 doses 05/03 Repeat furosemide. Agitation improving 05/04 Isolated fever. Recultured. Failed SBT. Tolerated PSV 14 cm H2O 05/05 past spontaneous breathing trial, followed commands. Extubated. Subsequently, developed increased work of breathing and BiPAP ordered. 05/06 Cognition much improved. Remains a little confused. No distress. Transferred to med-surg  INDWELLING DEVICES:: ETT 04/27 >> 05/05 L IJ CVL 05/02 >>   MICRO DATA: Flu PCR 04/28 >> POS for influenza B MRSA PCR 04/28 >> NEG Urine 04/27 >> NEG Resp 04/28 >> pneumococcus Blood 04/27 >> NEG Urine 05/04 >> yeast Resp 05/04 >> rare candida Blood 05/04 >>    ANTIMICROBIALS:  Vancomycin 04/27 >> 04/30 Pip-tazo 04/28 >> 04/30 Oseltamivir 04/28 >> 05/02 Levofloxacin 04/27 >> 05/03   SUBJECTIVE:  No distress. Slightly disoriented but cognition generally intact. Asks for something to help her sleep  VITAL SIGNS: BP 119/74   Pulse (!) 104   Temp 98.6 F (37 C) (Oral)   Resp 15   Ht 5\' 4"  (1.626 m)   Wt 216 lb 11.4 oz (98.3 kg)   SpO2 94%   BMI 37.20 kg/m   HEMODYNAMICS:    VENTILATOR SETTINGS:    INTAKE / OUTPUT: I/O last 3 completed  shifts: In: 2234.8 [I.V.:1264.8; NG/GT:970] Out: 1550 [Urine:1550]  PHYSICAL EXAMINATION: General:  RASS 0, + F/C Neuro: CNs intact, moves all extremities  HEENT: NCAT, sclerae white Cardiovascular: Regular, no murmurs Lungs: No wheezes Abdomen: Obese, soft, diminished bowel sounds Extremities: Warm, no edema Skin: No lesions noted  LABS:  BMET  Recent Labs Lab 11/12/16 0440 11/13/16 0440 11/14/16 0512  NA 140 140 140  K 3.6 3.6 3.3*  CL 97* 99* 103  CO2 34* 33* 32  BUN 22* 20 13  CREATININE 0.54 0.42* 0.40*  GLUCOSE 125* 118* 119*    Electrolytes  Recent Labs Lab 11/07/16 1512 11/08/16 0456  11/12/16 0440 11/13/16 0440 11/14/16 0512  CALCIUM  --   --   < > 8.8* 8.7* 8.7*  MG 2.1 1.7  --   --  1.9  --   PHOS 3.9 3.5  --   --  3.8  --   < > = values in this interval not displayed.  CBC  Recent Labs Lab 11/12/16 0440 11/13/16 0440 11/14/16 0512  WBC 14.8* 13.2* 13.9*  HGB 10.4* 9.9* 9.9*  HCT 30.7* 30.0* 29.5*  PLT 307 304 303    Coag's No results for input(s): APTT, INR in the last 168 hours.  Sepsis Markers  Recent Labs Lab 11/12/16 0440 11/13/16 0440 11/14/16 0512  PROCALCITON 0.11 <0.10 <0.10    ABG No results for input(s): PHART, PCO2ART, PO2ART in the last 168 hours.  Liver Enzymes  Recent Labs Lab 11/09/16 0418 11/10/16 0605  AST 37 23  ALT 11* 10*  ALKPHOS 55 53  BILITOT 0.6  0.5  ALBUMIN 2.6* 2.4*    Cardiac Enzymes No results for input(s): TROPONINI, PROBNP in the last 168 hours.  Glucose  Recent Labs Lab 11/12/16 0721 11/12/16 1626 11/12/16 2356 11/13/16 0732 11/13/16 1554 11/14/16 0753  GLUCAP 123* 109* 111* 108* 117* 118*    CXR: NNF   ASSESSMENT / PLAN:  PULMONARY A: Acute hypoxemic respiratory failure Influenza B pneumonitis ARDS, resolving Smoker Wheezing P:   Cont supplemental oxygen to maintain SPO2 greater than 90% Continue nebulized steroids and bronchodilators  CARDIOVASCULAR A:   Sinus tachycardia - resolved P:  Monitor rhythm, blood pressure  RENAL A:   Hypervolemia - resolved Hypokalemia P:   Monitor BMET intermittently Monitor I/Os Correct electrolytes as indicated - replete K+ 05/06  GASTROINTESTINAL A:   No acute issues P:   SUP: Not indicated after extubation NPO until respiratory status improves  HEMATOLOGIC A:   Mild anemia without acute blood loss P:  DVT px: Enoxaparin Monitor CBC intermittently Transfuse per usual guidelines  INFECTIOUS A:   Influenza pneumonitis - treated (oseltamivir X 5 days) Pneumococcal pneumonia - treated (levofloxacin X 7 days) Isolated fever 05/04 without definite infectious source  Normal PCT is reassuring P:   Monitor temp, WBC count Micro and abx as above Monitoring off abx  ENDOCRINE A:   Mild hyperglycemia without history of diabetes - resolved P:   Monitor glucose on chemistry panels Consider SSI for glucose greater than 180  NEUROLOGIC A:   Polysubstance abuse Severe agitated delirium - resolved Deconditioning P:   RASS goal: 0 Low dose PRN fentanyl Low dose PRN lorazepam PT eval and mgmt requested 05/06  After transfer, PCCM will sign off. Please call if we can be of further assistance  Billy Fischer, MD PCCM service Mobile [phone] Pager [phone] 11/14/2016 2:09 PM

## 2016-11-15 LAB — BASIC METABOLIC PANEL
ANION GAP: 8 (ref 5–15)
BUN: 11 mg/dL (ref 6–20)
CALCIUM: 9.1 mg/dL (ref 8.9–10.3)
CHLORIDE: 103 mmol/L (ref 101–111)
CO2: 29 mmol/L (ref 22–32)
Creatinine, Ser: 0.54 mg/dL (ref 0.44–1.00)
GFR calc non Af Amer: >60 mL/min (ref >60)
GLUCOSE: 115 mg/dL — AB (ref 65–99)
POTASSIUM: 2.8 mmol/L — AB (ref 3.5–5.1)
Sodium: 140 mmol/L (ref 135–145)

## 2016-11-15 MED ORDER — POTASSIUM CHLORIDE 10 MEQ/100ML IV SOLN
10.0000 meq | INTRAVENOUS | Status: DC
  Filled 2016-11-15 (×4): qty 100

## 2016-11-15 MED ORDER — POTASSIUM CHLORIDE ER 10 MEQ PO TBCR: 10.0000 meq | EXTENDED_RELEASE_TABLET | Freq: Every day | ORAL | 0 refills | Status: DC

## 2016-11-15 MED ORDER — POTASSIUM CHLORIDE CRYS ER 20 MEQ PO TBCR
40.0000 meq | EXTENDED_RELEASE_TABLET | ORAL | Status: AC
  Administered 2016-11-15: 40 meq via ORAL
  Filled 2016-11-15: qty 2

## 2016-11-15 MED ORDER — NYSTATIN 100000 UNIT/ML MT SUSP: 5.0000 mL | Freq: Four times a day (QID) | OROMUCOSAL | 0 refills | Status: DC

## 2016-11-15 NOTE — Care Management (Signed)
[facility] liaison met with patient.  Patient declined RN services due to co pay, and is only in agreement to PT.

## 2016-11-15 NOTE — Progress Notes (Signed)
PT Cancellation Note  Patient Details Name: Jennifer Vasquez MRN: [id number] DOB: [birth year]   Cancelled Treatment:    Reason Eval/Treat Not Completed: Patient declined, no reason specified   Pt with discharge orders.  Pt reported walking around unit with nursing and walker this morning.  Reports feeling unsteady but comfortable with walker.  Discussed discharge plan and encouraged +1 assist with walker.  Declined further mobility at this time.   Danielle Dess 11/15/2016, 11:52 AM

## 2016-11-15 NOTE — Progress Notes (Signed)
11/15/2016 5:28 PM  BP 119/77 (BP Location: Right Arm)   Pulse 98   Temp 98.2 F (36.8 C) (Oral)   Resp 18   Ht 5\' 4"  (1.626 m)   Wt 98.3 kg (216 lb 11.4 oz)   SpO2 95%   BMI 37.20 kg/m  Patient discharged per MD orders. Discharge instructions reviewed with patient and patient verbalized understanding. IV removed per policy. Prescriptions discussed and to be picked up from pharmacy.  Discharged via wheelchair escorted by nursing staff.  , RN

## 2016-11-15 NOTE — Care Management Note (Signed)
Case Management Note  Patient Details  Name: Jennifer Vasquez MRN: [id number] Date of Birth: February 06, 1986   Patient admitted for Acute respiratory failure with hypoxia.  PCP [facility].  Pharmacy [facility].  Patient will stay at father's house at discharge for support [address]. [place] [address].  Phone # [phone].  PT has assessed patient and recommended home health PT and RW.  Patient was provided home health agency preference.  Patient does not have preference.  Referral made to [facility].  Walker to be delivered to room prior to discharge.  Patient has been weaned to RA.  RNCM signing off.  Subjective/Objective:                    Action/Plan:   Expected Discharge Date:  11/15/16               Expected Discharge Plan:  Home w Home Health Services  In-House Referral:     Discharge planning Services  CM Consult  Post Acute Care Choice:  Durable Medical Equipment, Home Health Choice offered to:  Patient  DME Arranged:  Walker rolling DME Agency:  [facility]  HH Arranged:  RN, PT [facility] Agency:  [facility]  Status of Service:  Completed, signed off  If discussed at Long Length of Stay Meetings, dates discussed:    Additional Comments:  Chapman Fitch, RN 11/15/2016, 10:05 AM

## 2016-11-16 ENCOUNTER — Other Ambulatory Visit: Payer: Self-pay

## 2016-11-16 NOTE — Progress Notes (Signed)
[facility]  Patient Status: Patient's father contacted [facility] 5/7 to decline [facility] services due to copay. Notified Bevelyn Ngo, CM. MD has also been notified.   Dimple Casey 11/16/2016, 9:16 AM

## 2016-11-17 ENCOUNTER — Telehealth: Payer: Self-pay | Admitting: Gastroenterology

## 2016-11-17 LAB — CULTURE, BLOOD (ROUTINE X 2)
CULTURE: NO GROWTH
Culture: NO GROWTH
SPECIAL REQUESTS: ADEQUATE
Special Requests: ADEQUATE

## 2016-11-17 NOTE — Telephone Encounter (Signed)
Patient would like to talk to you. She stated that she has ten pills left. Please call [phone] or [phone]

## 2016-11-17 NOTE — Telephone Encounter (Signed)
Spoke with pt regarding her Harvoni. Waiting on Todd from [employer] to advise if we will need to get one more refill on her medication.

## 2016-11-17 NOTE — Telephone Encounter (Signed)
Patient called and LVM. She says she has 10 pills left and one whole bottle unopened.

## 2016-11-18 ENCOUNTER — Telehealth: Payer: Self-pay | Admitting: Gastroenterology

## 2016-11-18 NOTE — Telephone Encounter (Signed)
Contacted [employer] and answered the questions they required.

## 2016-11-18 NOTE — Telephone Encounter (Signed)
Ivor Reining with [employer] is calling about Aimie's Harvoni. Two clinical questions 1) What is patient's ethnicity? 2) Is patient co-infected with HIV? Please call her back at [phone] x [phone]

## 2016-11-19 ENCOUNTER — Telehealth: Payer: Self-pay | Admitting: Gastroenterology

## 2016-11-19 NOTE — Telephone Encounter (Signed)
Patient left a voice message for you to call her. No reason given °

## 2016-11-19 NOTE — Telephone Encounter (Signed)
Pt was checking the status of her Harvoni medication. Advised her I haven't received a notification yet, but I will let her know as soon as I do.

## 2016-11-20 NOTE — Discharge Summary (Signed)
[employer] [facility] [place] at [facility]   PATIENT NAME: Jennifer Vasquez    MR#:  [id number]  DATE OF BIRTH:  11/03/1985  DATE OF ADMISSION:  11/05/2016 ADMITTING PHYSICIAN: Ihor Austin, MD  DATE OF DISCHARGE: 11/15/2016  5:31 PM  PRIMARY CARE PHYSICIAN: Carlean Jews, NP   ADMISSION DIAGNOSIS:  SOB (shortness of breath) [R06.02] Hypoxia [R09.02] Community acquired pneumonia of left lung, unspecified part of lung [J18.9] Congestive heart failure, unspecified HF chronicity, unspecified heart failure type (HCC) [I50.9]  DISCHARGE DIAGNOSIS:  Active Problems:   Respiratory failure (HCC)   Community acquired pneumonia of left lung   SECONDARY DIAGNOSIS:   Past Medical History:  Diagnosis Date  . Acute carpal tunnel syndrome 10/10/2014  . Anxiety state 09/03/2009  . Asthma   . Bipolar disorder (HCC) 02/09/2013  . CHF (congestive heart failure) (HCC)   . COPD (chronic obstructive pulmonary disease) (HCC)   . DDD (degenerative disc disease), cervical 11/13/2014  . DDD (degenerative disc disease), lumbar 11/13/2014  . Hepatitis C   . Lumbosacral radiculopathy 11/13/2014  . Tobacco use disorder 09/06/2012     ADMITTING HISTORY  HISTORY OF PRESENT ILLNESS: Jennifer Vasquez  is a 31 y.o. female with a known history of Congestive heart failure, bipolar disorder, bronchial asthma, COPD, degenerative joint disease, hepatitis C, tobacco abuse presented to the emergency room with shortness of breath and lethargy. Patient was in respiratory distress and her O2 saturation was around 70% to 80% on room air. Initially she was tried on BiPAP in the emergency room but she could not maintain her saturation and she was very lethargic and also had some cough. Not much history could be obtained on the patient. She was intubated by the ER physician put on ventilator. Patient was put on IV propofol drip and IV Versed drip for sedation. Troponin was elevated and chest x-ray showed left lung  pneumonia-and patient was started on IV vancomycin and IV Levaquin antibiotic. Hospitalist service was consulted for further care of the patient. Chest x-ray also showed vascular congestion and fluid overload.  HOSPITAL COURSE:   31 year old female with past medical history of COPD, degenerative disc disease, bipolar disorder, hepatitis C, CHF who presented to the hospital due to shortness of breath and noted to be in acute respiratory failure with hypoxia secondary to COPD exacerbation and pneumonia.  1. Acute respiratory failure with hypoxia-secondary to COPD exacerbation due to Influenza with superimposed Pneumonia. -Intubated for few days On full ventilatory support   - extubated 11/13/16. -Initially on nasal cannula. By the day of discharge patient is saturating 94% on room air. Respiratory failure has resolved. No wheezing.  2. COPD exacerbation-secondary to underlying Flu w/ superimpose pneumonia. - giving IV antibiotics with Levaquin ( Finished 7 days). given Tamiflu( finished 5 days).   - tracheal aspirate have strep pneumonia on culture. Sensitive to levaquin. Finished antibiotic course in the hospital.  4. Elevated Trop - due to supply demand ischemia from hypoxia.  - Echo showing Normal LV function.   5. GERD - cont. Pepcid IV  6. Hypomagnesemia - improved and resolved w/ supplementation.   Patient has been counseled to quit smoking. Has inhalers and nebulizers at home. Home health has been set up. Follow-up with primary care physician closely within a week.  CONSULTS OBTAINED:    DRUG ALLERGIES:   Allergies  Allergen Reactions  . Acetaminophen     Other reaction(s): Other (qualifier value) Due to hep c  . Amoxicillin-Pot Clavulanate Diarrhea and  Nausea And Vomiting  . Gabapentin Hives  . Tetanus Toxoids   . Tramadol Hives  . Shellfish Allergy Rash    oysters    DISCHARGE MEDICATIONS:   Discharge Medication List as of 11/15/2016  9:54 AM    START taking  these medications   Details  nystatin (MYCOSTATIN) 100000 UNIT/ML suspension Use as directed 5 mLs (500,000 Units total) in the mouth or throat 4 (four) times daily., Starting Mon 11/15/2016, Normal    potassium chloride (K-DUR) 10 MEQ tablet Take 1 tablet (10 mEq total) by mouth daily., Starting Mon 11/15/2016, Normal      CONTINUE these medications which have NOT CHANGED   Details  albuterol (PROVENTIL HFA;VENTOLIN HFA) 108 (90 Base) MCG/ACT inhaler Inhale 1 puff into the lungs every 6 (six) hours as needed for wheezing or shortness of breath., Historical Med    albuterol-ipratropium (COMBIVENT) 18-103 MCG/ACT inhaler Inhale 1 puff into the lungs 4 (four) times daily as needed. , Historical Med    baclofen (LIORESAL) 10 MG tablet Take 5-10 mg by mouth 3 (three) times daily as needed for muscle spasms., Historical Med    cyclobenzaprine (FLEXERIL) 10 MG tablet 3 (three) times daily. , Starting Fri 06/04/2016, Historical Med    fluticasone (FLONASE) 50 MCG/ACT nasal spray 2 sprays daily as needed. , Starting Fri 06/11/2016, Historical Med    Fluticasone-Salmeterol (ADVAIR DISKUS) 250-50 MCG/DOSE AEPB 1 inhalation 2 (two) times daily., Historical Med    HYDROcodone-acetaminophen (NORCO) 10-325 MG tablet Take 1 tablet by mouth every 6 (six) hours as needed., Historical Med    ibuprofen (ADVIL,MOTRIN) 800 MG tablet Take 800 mg by mouth every 6 (six) hours as needed for fever or mild pain. , Historical Med    levothyroxine (SYNTHROID, LEVOTHROID) 25 MCG tablet Take 25 mcg by mouth daily before breakfast., Historical Med    ondansetron (ZOFRAN ODT) 4 MG disintegrating tablet Take 1 tablet (4 mg total) by mouth every 8 (eight) hours as needed for nausea or vomiting., Starting Tue 10/19/2016, Normal    pregabalin (LYRICA) 25 MG capsule Take 25 mg by mouth 3 (three) times daily., Historical Med    traZODone (DESYREL) 50 MG tablet Take 50 mg by mouth at bedtime as needed for sleep., Historical Med       STOP taking these medications     furosemide (LASIX) 20 MG tablet      ondansetron (ZOFRAN) 4 MG tablet      predniSONE (DELTASONE) 20 MG tablet         Today   VITAL SIGNS:  Blood pressure 119/77, pulse 98, temperature 98.2 F (36.8 C), temperature source Oral, resp. rate 18, height 5\' 4"  (1.626 m), weight 98.3 kg (216 lb 11.4 oz), SpO2 95 %.  I/O:  No intake or output data in the 24 hours ending 11/20/16 1216  PHYSICAL EXAMINATION:  Physical Exam  GENERAL:  31 y.o.-year-old patient lying in the bed with no acute distress.  LUNGS: Normal breath sounds bilaterally, no wheezing, rales,rhonchi or crepitation. No use of accessory muscles of respiration.  CARDIOVASCULAR: S1, S2 normal. No murmurs, rubs, or gallops.  ABDOMEN: Soft, non-tender, non-distended. Bowel sounds present. No organomegaly or mass.  NEUROLOGIC: Moves all 4 extremities. PSYCHIATRIC: The patient is alert and oriented x 3.  SKIN: No obvious rash, lesion, or ulcer.   DATA REVIEW:   CBC  Recent Labs Lab 11/14/16 0512  WBC 13.9*  HGB 9.9*  HCT 29.5*  PLT 303    Chemistries  Recent Labs Lab 11/15/16 0508  NA 140  K 2.8*  CL 103  CO2 29  GLUCOSE 115*  BUN 11  CREATININE 0.54  CALCIUM 9.1    Cardiac Enzymes No results for input(s): TROPONINI in the last 168 hours.  Microbiology Results  Results for orders placed or performed during the hospital encounter of 11/05/16  Blood Culture (routine x 2)     Status: None   Collection Time: 11/05/16 10:41 PM  Result Value Ref Range Status   Specimen Description BLOOD RIGHT ANTECUBITAL  Final   Special Requests   Final    BOTTLES DRAWN AEROBIC AND ANAEROBIC Blood Culture adequate volume   Culture NO GROWTH 5 DAYS  Final   Report Status 11/10/2016 FINAL  Final  Blood Culture (routine x 2)     Status: None   Collection Time: 11/05/16 10:41 PM  Result Value Ref Range Status   Specimen Description BLOOD RIGHT ARM  Final   Special Requests    Final    BOTTLES DRAWN AEROBIC AND ANAEROBIC Blood Culture adequate volume   Culture NO GROWTH 5 DAYS  Final   Report Status 11/10/2016 FINAL  Final  Urine culture     Status: None   Collection Time: 11/05/16 10:41 PM  Result Value Ref Range Status   Specimen Description URINE, RANDOM  Final   Special Requests NONE  Final   Culture   Final    NO GROWTH Performed at [facility], [address], [place], [place] [address]    Report Status 11/07/2016 FINAL  Final  MRSA PCR Screening     Status: None   Collection Time: 11/06/16  1:00 AM  Result Value Ref Range Status   MRSA by PCR NEGATIVE NEGATIVE Final    Comment:        The GeneXpert MRSA Assay (FDA approved for NASAL specimens only), is one component of a comprehensive MRSA colonization surveillance program. It is not intended to diagnose MRSA infection nor to guide or monitor treatment for MRSA infections.   Culture, respiratory (NON-Expectorated)     Status: None   Collection Time: 11/06/16  7:45 AM  Result Value Ref Range Status   Specimen Description TRACHEAL ASPIRATE  Final   Special Requests NONE  Final   Gram Stain   Final    MODERATE WBC PRESENT, PREDOMINANTLY PMN RARE GRAM POSITIVE COCCI IN PAIRS RARE GRAM POSITIVE RODS Performed at [facility], [address] [place], [place] [address]    Culture FEW STREPTOCOCCUS PNEUMONIAE  Final   Report Status 11/08/2016 FINAL  Final   Organism ID, Bacteria STREPTOCOCCUS PNEUMONIAE  Final      Susceptibility   Streptococcus pneumoniae - MIC*    ERYTHROMYCIN >=8 RESISTANT Resistant     LEVOFLOXACIN 0.5 SENSITIVE Sensitive     PENICILLIN (meningitis) 1 RESISTANT Resistant     PENICILLIN (non-meningitis) 1 SENSITIVE Sensitive     CEFTRIAXONE (non-meningitis) 0.5 SENSITIVE Sensitive     CEFTRIAXONE (meningitis) 0.5 SENSITIVE Sensitive     * FEW STREPTOCOCCUS PNEUMONIAE  Culture, blood (Routine X 2) w Reflex to ID Panel     Status: None   Collection Time:  11/12/16  3:10 AM  Result Value Ref Range Status   Specimen Description BLOOD R AC  Final   Special Requests Blood Culture adequate volume  Final   Culture NO GROWTH 5 DAYS  Final   Report Status 11/17/2016 FINAL  Final  Culture, blood (Routine X 2)  w Reflex to ID Panel     Status: None   Collection Time: 11/12/16  3:16 AM  Result Value Ref Range Status   Specimen Description BLOOD LEFT HAND  Final   Special Requests Blood Culture adequate volume  Final   Culture NO GROWTH 5 DAYS  Final   Report Status 11/17/2016 FINAL  Final  Urine culture     Status: Abnormal   Collection Time: 11/12/16  4:40 AM  Result Value Ref Range Status   Specimen Description URINE, RANDOM  Final   Special Requests NONE  Final   Culture >=100,000 COLONIES/mL YEAST (A)  Final   Report Status 11/13/2016 FINAL  Final  Culture, respiratory (NON-Expectorated)     Status: None   Collection Time: 11/12/16  9:17 AM  Result Value Ref Range Status   Specimen Description TRACHEAL ASPIRATE  Final   Special Requests NONE  Final   Gram Stain   Final    RARE WBC PRESENT, PREDOMINANTLY PMN NO SQUAMOUS EPITHELIAL CELLS SEEN NO ORGANISMS SEEN Performed at [facility], [address], [place], [place] [address]    Culture RARE CANDIDA DUBLINIENSIS  Final   Report Status 11/14/2016 FINAL  Final  Culture, respiratory (NON-Expectorated)     Status: None   Collection Time: 11/12/16 11:00 AM  Result Value Ref Range Status   Specimen Description TRACHEAL ASPIRATE  Final   Special Requests NONE  Final   Gram Stain   Final    ABUNDANT WBC PRESENT,BOTH PMN AND MONONUCLEAR MODERATE YEAST    Culture   Final    Consistent with normal respiratory flora. Performed at [facility], [address], [place], [place] [address]    Report Status 11/14/2016 FINAL  Final    RADIOLOGY:  No results found.  Follow up with PCP in 1 week.  Management plans discussed with the patient, family and they are in  agreement.  CODE STATUS:  Code Status History    Date Active Date Inactive Code Status Order ID Comments User Context   11/05/2016 11:40 PM 11/15/2016  8:39 PM Full Code [id number]  Ihor Austin, MD ED      TOTAL TIME TAKING CARE OF THIS PATIENT ON DAY OF DISCHARGE: more than 30 minutes.   Milagros Loll R M.D on 11/20/2016 at 12:16 PM  Between 7am to 6pm - Pager - [phone]  After 6pm go to [email or web address] - password [email or web address]  [facility]  Office  [phone]  CC: Primary care physician; Carlean Jews, NP  Note: This dictation was prepared with Dragon dictation along with smaller phrase technology. Any transcriptional errors that result from this process are unintentional.

## 2016-11-22 ENCOUNTER — Telehealth: Payer: Self-pay | Admitting: Gastroenterology

## 2016-11-22 NOTE — Telephone Encounter (Signed)
Patient was wondering if you have heard from insurance yet for Harvoni. Ginger told her to ask for you.

## 2016-11-23 NOTE — Telephone Encounter (Signed)
Returned pts call-informed her we have not yet heard back from insurance company regarding Harvoni. Will let her know as soon as I do.

## 2016-11-26 ENCOUNTER — Other Ambulatory Visit: Payer: Self-pay | Admitting: Nurse Practitioner

## 2016-11-26 DIAGNOSIS — J158 Pneumonia due to other specified bacteria: Secondary | ICD-10-CM

## 2016-11-30 ENCOUNTER — Ambulatory Visit
Admission: RE | Admit: 2016-11-30 | Discharge: 2016-11-30 | Disposition: A | Discharge status: Home / Self Care | Payer: BLUE CROSS/BLUE SHIELD | Source: Ambulatory Visit | Attending: Nurse Practitioner | Admitting: Nurse Practitioner

## 2016-11-30 DIAGNOSIS — J158 Pneumonia due to other specified bacteria: Secondary | ICD-10-CM

## 2016-12-01 ENCOUNTER — Telehealth: Payer: Self-pay | Admitting: Gastroenterology

## 2016-12-01 NOTE — Telephone Encounter (Signed)
Patient left a voice message wondering if you heard anything yet?

## 2016-12-08 NOTE — Telephone Encounter (Signed)
Spoke with pt regarding her Harvoni medication and the need to come in and sign the release of information form from [employer]. Pt stated she received the form via the mail. She signed and mailed the form back. Waiting on approval.

## 2017-01-06 ENCOUNTER — Telehealth: Payer: Self-pay | Admitting: Gastroenterology

## 2017-01-06 NOTE — Telephone Encounter (Signed)
Can you heard from the insurance company?

## 2017-01-07 NOTE — Telephone Encounter (Signed)
Spoke with pt regarding her insurance. Advised her we have not received anything from her insurance company about the [place]. Pt will call them back and let me know what's going on.

## 2017-01-13 ENCOUNTER — Other Ambulatory Visit: Payer: Self-pay

## 2017-01-13 MED ORDER — ONDANSETRON 4 MG PO TBDP: 4.0000 mg | ORAL_TABLET | Freq: Three times a day (TID) | ORAL | 3 refills | Status: DC | PRN

## 2017-03-21 ENCOUNTER — Encounter: Payer: Self-pay | Admitting: Emergency Medicine

## 2017-03-21 ENCOUNTER — Emergency Department
Admission: EM | Admit: 2017-03-21 | Discharge: 2017-03-21 | Disposition: A | Discharge status: Home / Self Care | Payer: Self-pay | Attending: Emergency Medicine | Admitting: Emergency Medicine

## 2017-03-21 ENCOUNTER — Emergency Department: Payer: Self-pay

## 2017-03-21 DIAGNOSIS — R0789 Other chest pain: Secondary | ICD-10-CM | POA: Insufficient documentation

## 2017-03-21 DIAGNOSIS — I5042 Chronic combined systolic (congestive) and diastolic (congestive) heart failure: Secondary | ICD-10-CM | POA: Insufficient documentation

## 2017-03-21 DIAGNOSIS — J45909 Unspecified asthma, uncomplicated: Secondary | ICD-10-CM | POA: Insufficient documentation

## 2017-03-21 DIAGNOSIS — F17210 Nicotine dependence, cigarettes, uncomplicated: Secondary | ICD-10-CM | POA: Insufficient documentation

## 2017-03-21 DIAGNOSIS — J449 Chronic obstructive pulmonary disease, unspecified: Secondary | ICD-10-CM | POA: Insufficient documentation

## 2017-03-21 DIAGNOSIS — J069 Acute upper respiratory infection, unspecified: Secondary | ICD-10-CM | POA: Insufficient documentation

## 2017-03-21 DIAGNOSIS — R0602 Shortness of breath: Secondary | ICD-10-CM | POA: Insufficient documentation

## 2017-03-21 DIAGNOSIS — R05 Cough: Secondary | ICD-10-CM | POA: Insufficient documentation

## 2017-03-21 LAB — URINALYSIS, COMPLETE (UACMP) WITH MICROSCOPIC
Bacteria, UA: NONE SEEN
Bilirubin Urine: NEGATIVE
GLUCOSE, UA: NEGATIVE mg/dL
HGB URINE DIPSTICK: NEGATIVE
Ketones, ur: NEGATIVE mg/dL
Leukocytes, UA: NEGATIVE
NITRITE: NEGATIVE
PROTEIN: NEGATIVE mg/dL
Specific Gravity, Urine: 1.01 (ref 1.005–1.030)
Squamous Epithelial / LPF: NONE SEEN
WBC, UA: NONE SEEN WBC/hpf (ref 0–5)
pH: 7 (ref 5.0–8.0)

## 2017-03-21 LAB — FIBRIN DERIVATIVES D-DIMER (ARMC ONLY): FIBRIN DERIVATIVES D-DIMER (ARMC): 224.3 (ref 0.00–499.00)

## 2017-03-21 LAB — BASIC METABOLIC PANEL
ANION GAP: 7 (ref 5–15)
BUN: 12 mg/dL (ref 6–20)
CO2: 24 mmol/L (ref 22–32)
Calcium: 8.9 mg/dL (ref 8.9–10.3)
Chloride: 108 mmol/L (ref 101–111)
Creatinine, Ser: 0.77 mg/dL (ref 0.44–1.00)
Glucose, Bld: 97 mg/dL (ref 65–99)
POTASSIUM: 3.5 mmol/L (ref 3.5–5.1)
SODIUM: 139 mmol/L (ref 135–145)

## 2017-03-21 LAB — CHLAMYDIA/NGC RT PCR (ARMC ONLY)
Chlamydia Tr: NOT DETECTED
N gonorrhoeae: NOT DETECTED

## 2017-03-21 LAB — CBC
HEMATOCRIT: 35.6 % (ref 35.0–47.0)
HEMOGLOBIN: 12.6 g/dL (ref 12.0–16.0)
MCH: 33.9 pg (ref 26.0–34.0)
MCHC: 35.4 g/dL (ref 32.0–36.0)
MCV: 96 fL (ref 80.0–100.0)
Platelets: 217 10*3/uL (ref 150–440)
RBC: 3.71 MIL/uL — AB (ref 3.80–5.20)
RDW: 13.4 % (ref 11.5–14.5)
WBC: 7.8 10*3/uL (ref 3.6–11.0)

## 2017-03-21 LAB — TROPONIN I: Troponin I: <0.03 ng/mL (ref <0.03)

## 2017-03-21 MED ORDER — AZITHROMYCIN 250 MG PO TABS: ORAL_TABLET | ORAL | 0 refills | Status: DC

## 2017-03-21 MED ORDER — HYDROCOD POLST-CPM POLST ER 10-8 MG/5ML PO SUER: 5.0000 mL | Freq: Two times a day (BID) | ORAL | 0 refills | Status: DC

## 2017-03-21 MED ORDER — HYDROCOD POLST-CPM POLST ER 10-8 MG/5ML PO SUER
5.0000 mL | Freq: Once | ORAL | Status: AC
  Administered 2017-03-21: 5 mL via ORAL
  Filled 2017-03-21: qty 5

## 2017-03-21 NOTE — ED Notes (Signed)
Pt c/o nonproductive dry cough for the past week with a temp 100.1 for the past day. States she was recently admitted in may with flu and pneumonia to the ICU.the patient is in NAD on arrival to the room, respirations WNL.. Skin is warm and dry.Jennifer Vasquez

## 2017-03-21 NOTE — ED Provider Notes (Signed)
[facility] Emergency Department Provider Note       Time seen: ----------------------------------------- 8:07 AM on 03/21/2017 -----------------------------------------     I have reviewed the triage vital signs and the nursing notes.   HISTORY   Chief Complaint Shortness of Breath and Chest Pain    HPI Jennifer Vasquez is a 31 y.o. female who presents to the ED for chest pain and shortness of breath for the past week. Patient reports that the chest pain comes and goes, currently is 3 out of 10 in the chest. She has had a cough that has been nonproductive, symptoms are reminding her of when she had pneumonia last year after influenza. She has had a fever but denies chills or other complaints.   Past Medical History:  Diagnosis Date  . Acute carpal tunnel syndrome 10/10/2014  . Anxiety state 09/03/2009  . Asthma   . Bipolar disorder (HCC) 02/09/2013  . CHF (congestive heart failure) (HCC)   . COPD (chronic obstructive pulmonary disease) (HCC)   . DDD (degenerative disc disease), cervical 11/13/2014  . DDD (degenerative disc disease), lumbar 11/13/2014  . Hepatitis C   . Lumbosacral radiculopathy 11/13/2014  . Tobacco use disorder 09/06/2012    Patient Active Problem List   Diagnosis Date Noted  . Community acquired pneumonia of left lung   . Respiratory failure (HCC) 11/05/2016  . DDD (degenerative disc disease), lumbar 11/13/2014  . DDD (degenerative disc disease), cervical 11/13/2014  . Lumbosacral radiculopathy 11/13/2014  . Acute carpal tunnel syndrome 10/10/2014  . Chronic pain 03/02/2013  . Depression 03/02/2013  . Bipolar disorder (HCC) 02/09/2013  . Hepatitis C 12/18/2012  . Asthma 11/23/2012  . Neck pain 11/23/2012  . Torticollis 11/23/2012  . Tobacco use disorder 09/06/2012  . Anxiety state 09/03/2009    Past Surgical History:  Procedure Laterality Date  . CERVICAL DISC ARTHROPLASTY    . TONSILLECTOMY    . TUBAL LIGATION       Allergies Acetaminophen; Amoxicillin-pot clavulanate; Gabapentin; Tetanus toxoids; Tramadol; and Shellfish allergy  Social History Social History  Substance Use Topics  . Smoking status: Current Some Day Smoker    Packs/day: 0.50    Types: Cigarettes  . Smokeless tobacco: Never Used  . Alcohol use No    Review of Systems Constitutional: Negative for fever. Eyes: Negative for vision changes ENT:  Negative for congestion, sore throat Cardiovascular: Positive for chest pain Respiratory: Positive for shortness of breath and cough Gastrointestinal: Negative for abdominal pain, vomiting and diarrhea. Genitourinary: Negative for dysuria. Musculoskeletal: Negative for back pain. Skin: Negative for rash. Neurological: Negative for headaches, focal weakness or numbness.  All systems negative/normal/unremarkable except as stated in the HPI  ____________________________________________   PHYSICAL EXAM:  VITAL SIGNS: ED Triage Vitals  Enc Vitals Group     BP 03/21/17 0754 (!) 133/93     Pulse Rate 03/21/17 0754 89     Resp 03/21/17 0754 18     Temp 03/21/17 0754 98 F (36.7 C)     Temp Source 03/21/17 0754 Oral     SpO2 03/21/17 0754 99 %     Weight --      Height --      Head Circumference --      Peak Flow --      Pain Score 03/21/17 0755 3     Pain Loc --      Pain Edu? --      Excl. in GC? --  Constitutional: Alert and oriented. Well appearing and in no distress. Eyes: Conjunctivae are normal. Normal extraocular movements. ENT   Head: Normocephalic and atraumatic.   Nose: No congestion/rhinnorhea.   Mouth/Throat: Mucous membranes are moist.   Neck: No stridor. Cardiovascular: Normal rate, regular rhythm. No murmurs, rubs, or gallops. Respiratory: Normal respiratory effort without tachypnea nor retractions. Breath sounds are clear and equal bilaterally. No wheezes/rales/rhonchi. Gastrointestinal: Soft and nontender. Normal bowel  sounds Musculoskeletal: Nontender with normal range of motion in extremities. No lower extremity tenderness nor edema. Neurologic:  Normal speech and language. No gross focal neurologic deficits are appreciated.  Skin:  Skin is warm, dry and intact. No rash noted. Psychiatric: Mood and affect are normal. Speech and behavior are normal.  ____________________________________________  EKG: Interpreted by me.Sinus rhythm rate of 91 bpm, normal PR interval, normal QRS, normal QT.  ____________________________________________  ED COURSE:  Pertinent labs & imaging results that were available during my care of the patient were reviewed by me and considered in my medical decision making (see chart for details). Patient presents for chest pain and cough, we will assess with labs and imaging as indicated.   Procedures ____________________________________________   LABS (pertinent positives/negatives)  Labs Reviewed  CBC - Abnormal; Notable for the following:       Result Value   RBC 3.71 (*)    All other components within normal limits  CHLAMYDIA/NGC RT PCR ([facility] ONLY)  BASIC METABOLIC PANEL  TROPONIN I  FIBRIN DERIVATIVES D-DIMER ([facility] ONLY)  URINALYSIS, COMPLETE ([facility]) WITH MICROSCOPIC    RADIOLOGY  Chest x-ray  IMPRESSION: No edema or consolidation. ____________________________________________  FINAL ASSESSMENT AND PLAN  Cough, chest wall pain   Plan: Patient's labs and imaging were dictated above. Patient had presented for Cough and likely chest wall pain from same. Her labs workup. Reassuring as was her d-dimer. Chest x-ray did not reveal any pneumonia. She'll be discharged with cough medication and I will prescribe an antibiotic for her to start in 1 week if her symptoms don't improve.   Emily Filbert, MD   Note: This note was generated in part or whole with voice recognition software. Voice recognition is usually quite accurate but there are  transcription errors that can and very often do occur. I apologize for any typographical errors that were not detected and corrected.     Emily Filbert, MD 03/21/17 [phone]

## 2017-03-21 NOTE — ED Triage Notes (Signed)
Pt with chest pain and shortness of breath for over a week, states the chest pain comes and goes.

## 2017-05-14 ENCOUNTER — Emergency Department: Payer: Self-pay

## 2017-05-14 ENCOUNTER — Encounter: Payer: Self-pay | Admitting: Emergency Medicine

## 2017-05-14 DIAGNOSIS — Z5321 Procedure and treatment not carried out due to patient leaving prior to being seen by health care provider: Secondary | ICD-10-CM | POA: Insufficient documentation

## 2017-05-14 LAB — BASIC METABOLIC PANEL
ANION GAP: 9 (ref 5–15)
BUN: 10 mg/dL (ref 6–20)
CHLORIDE: 105 mmol/L (ref 101–111)
CO2: 24 mmol/L (ref 22–32)
CREATININE: 0.75 mg/dL (ref 0.44–1.00)
Calcium: 9.1 mg/dL (ref 8.9–10.3)
GFR calc non Af Amer: >60 mL/min (ref >60)
Glucose, Bld: 101 mg/dL — ABNORMAL HIGH (ref 65–99)
Potassium: 4 mmol/L (ref 3.5–5.1)
SODIUM: 138 mmol/L (ref 135–145)

## 2017-05-14 LAB — CBC
HCT: 38.1 % (ref 35.0–47.0)
HEMOGLOBIN: 13.3 g/dL (ref 12.0–16.0)
MCH: 33.6 pg (ref 26.0–34.0)
MCHC: 34.8 g/dL (ref 32.0–36.0)
MCV: 96.6 fL (ref 80.0–100.0)
Platelets: 247 10*3/uL (ref 150–440)
RBC: 3.94 MIL/uL (ref 3.80–5.20)
RDW: 12.7 % (ref 11.5–14.5)
WBC: 9.8 10*3/uL (ref 3.6–11.0)

## 2017-05-14 MED ORDER — ALBUTEROL SULFATE (2.5 MG/3ML) 0.083% IN NEBU
5.0000 mg | INHALATION_SOLUTION | Freq: Once | RESPIRATORY_TRACT | Status: AC
  Administered 2017-05-14: 5 mg via RESPIRATORY_TRACT
  Filled 2017-05-14: qty 6

## 2017-05-14 NOTE — ED Triage Notes (Signed)
Pt to triage via WC, reports SOB, cough, and fever over past four days, taking tylenol at home w/ limited relief.  Reports using inhalers and nebs at home w/ limited relief.  Hx of COPD and asthma.  Pt reports checked o2 sat at home and it was in 80s, pt 96% on RA in triage.

## 2017-05-15 ENCOUNTER — Emergency Department
Admission: EM | Admit: 2017-05-15 | Discharge: 2017-05-15 | Disposition: A | Discharge status: Home / Self Care | Payer: Self-pay | Attending: Emergency Medicine | Admitting: Emergency Medicine

## 2017-05-15 NOTE — ED Notes (Signed)
No answer in lobby when called for treatment room

## 2017-05-16 ENCOUNTER — Telehealth: Payer: Self-pay | Admitting: Emergency Medicine

## 2017-05-16 NOTE — Telephone Encounter (Signed)
Called patient due to lwot to inquire about condition and follow up plans. Mailbox is full.  No answer.

## 2017-05-23 IMAGING — US US [PERSON_NAME]
1 series · 13 of 25 positions shown · non-contrast
Comparison: None.

CLINICAL DATA: Chronic hepatitis-C without hepatic coma.

EXAM:
US ABDOMEN LIMITED - RIGHT UPPER QUADRANT
ULTRASOUND HEPATIC ELASTOGRAPHY
TECHNIQUE: Limited right upper quadrant abdominal ultrasound was performed. In
addition, ultrasound elastography evaluation of the liver was
performed. A region of interest was placed in the right lobe of the
liver. Following application of a compressive sonographic pulse,
shear waves were detected in the adjacent hepatic tissue and the
shear wave velocity was calculated. Multiple assessments were
performed at the selected site. Median shear wave velocity is
correlated to a Metavir fibrosis score.

[Series 1: us (person_name) · 0.25mm/px · 13 of 56 slices shown]
[im 1/56]
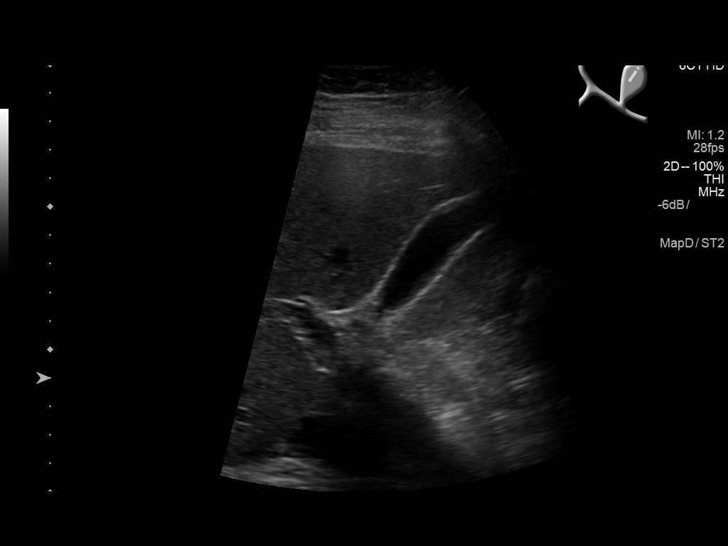
[im 5/56]
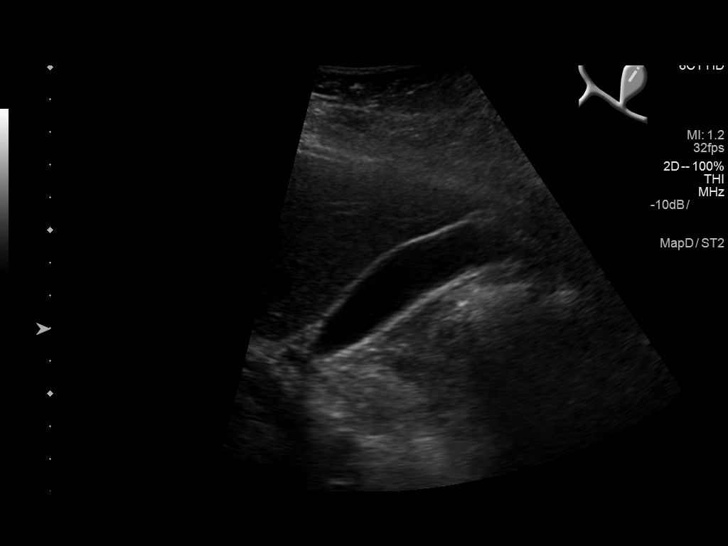
[im 10/56]
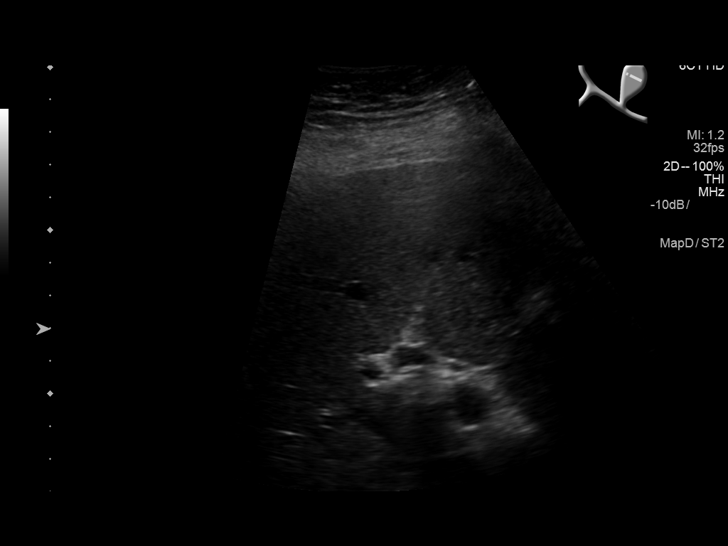
[im 14/56]
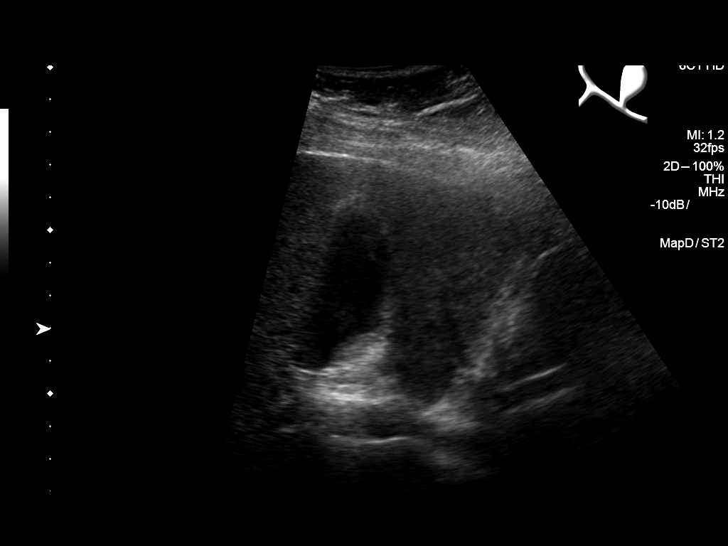
[im 19/56]
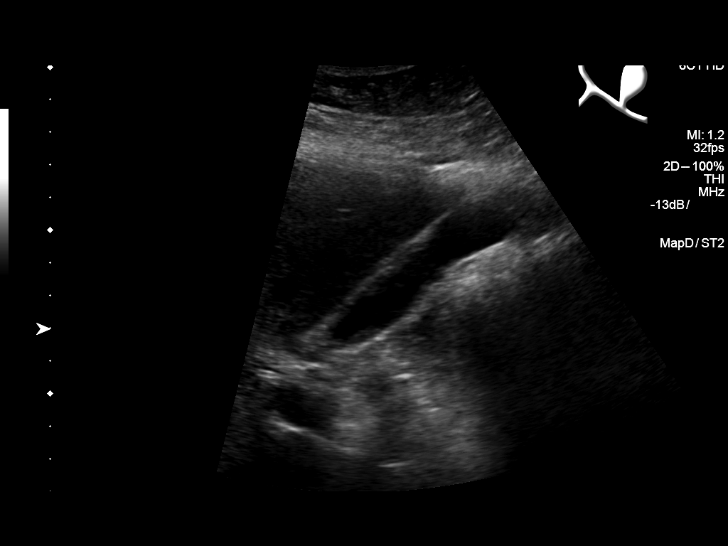
[im 23/56]
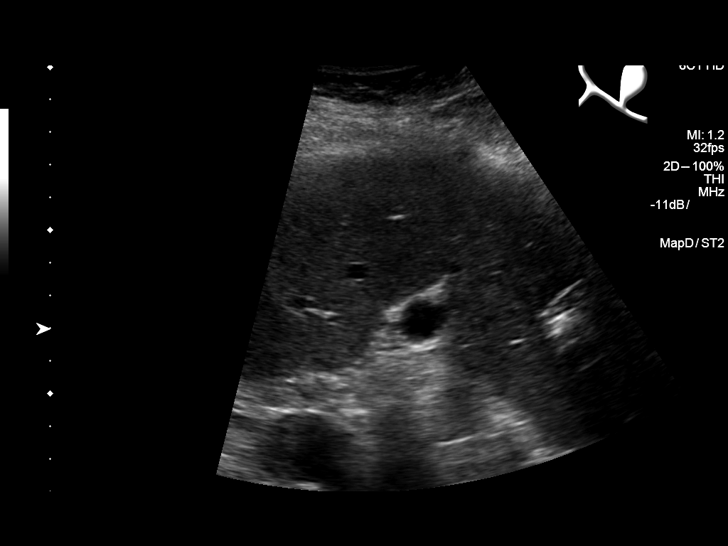
[im 28/56]
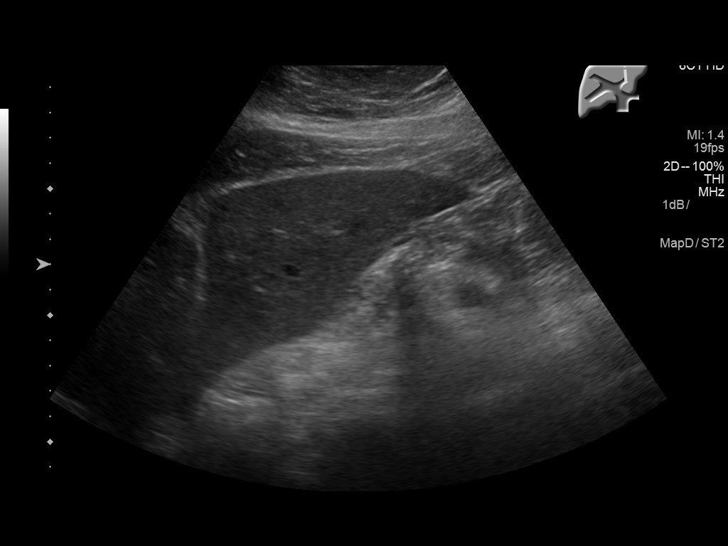
[im 33/56]
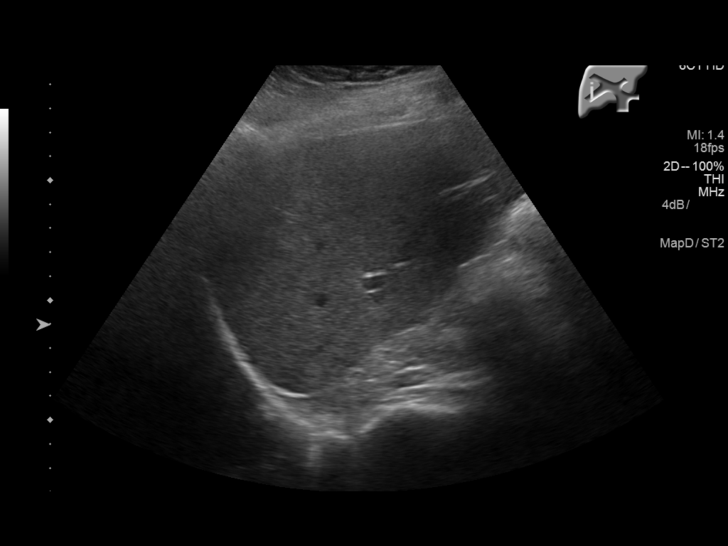
[im 37/56]
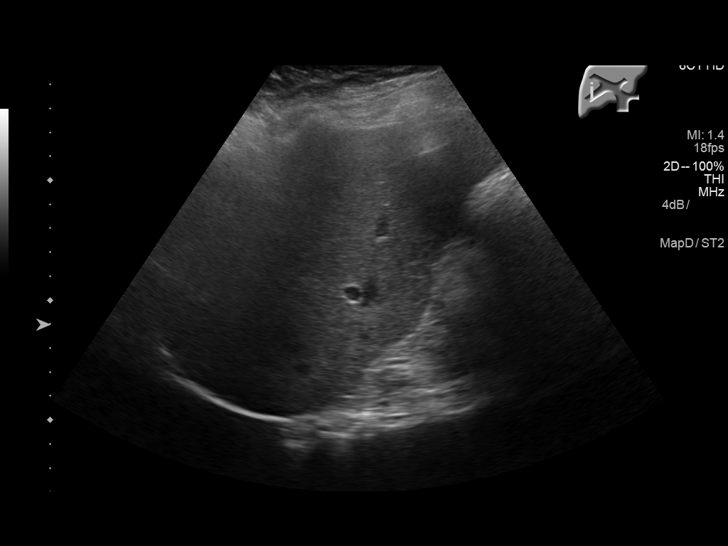
[im 42/56]
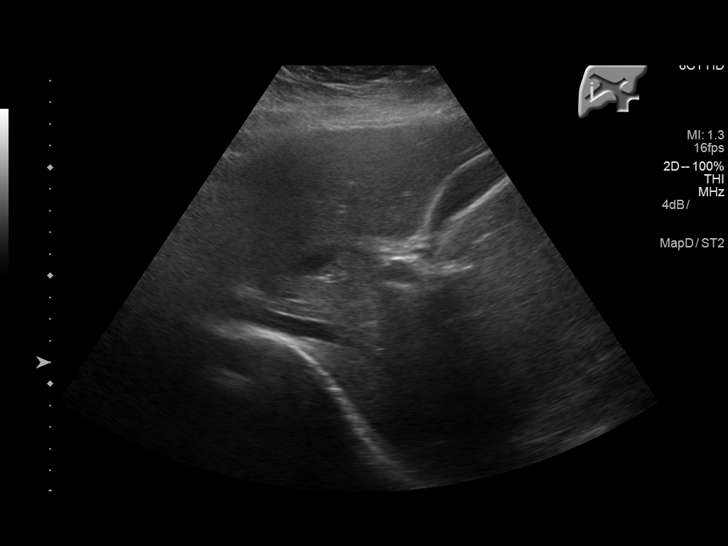
[im 46/56]
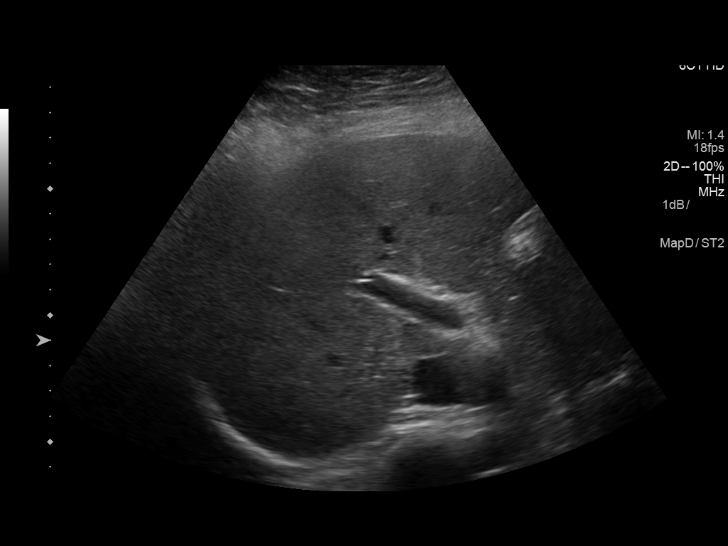
[im 51/56]
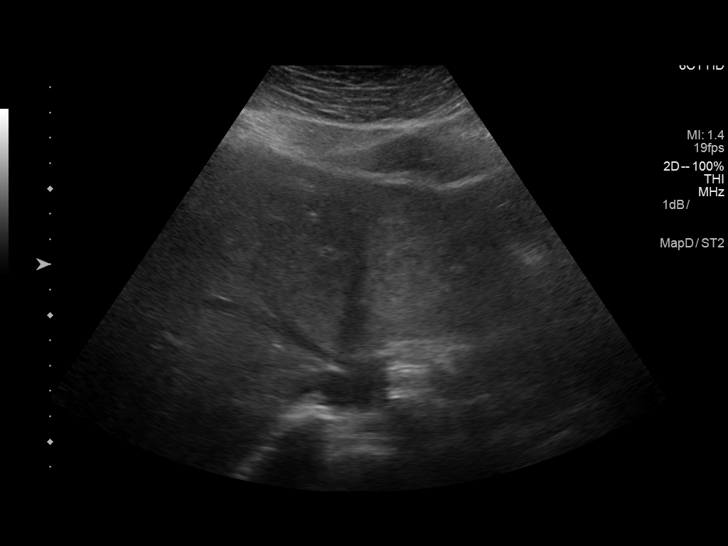
[im 56/56]
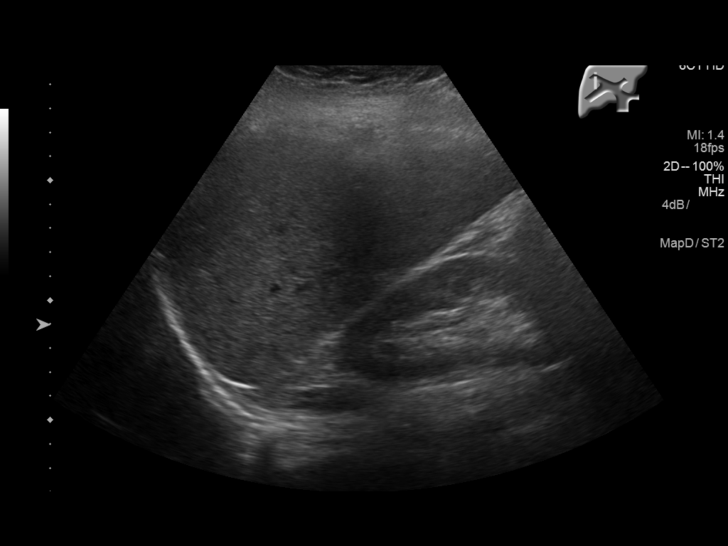

[13 of 25 positions shown; findings below may reference images not displayed]

FINDINGS: ULTRASOUND ABDOMEN LIMITED RIGHT UPPER QUADRANT

Gallbladder:

No gallstones or wall thickening visualized. No sonographic Murphy
sign noted.

Common bile duct:

Diameter: 3 mm

Liver:

No focal lesion identified. Within normal limits in parenchymal
echogenicity.

ULTRASOUND HEPATIC ELASTOGRAPHY

Device: Siemens Helix VTQ

Patient position: Oblique

Transducer [id number]

Number of measurements: 10

Hepatic segment:  8

Median velocity:   0.64  m/sec

IQR:

IQR/Median velocity ratio:

Corresponding Metavir fibrosis score:  F0/F1

Risk of fibrosis: Minimal

Limitations of exam: None

Pertinent findings noted on other imaging exams:  None

Please note that abnormal shear wave velocities may also be
identified in clinical settings other than with hepatic fibrosis,
such as: acute hepatitis, elevated right heart and central venous
pressures including use of beta blockers, Smokealien disease
(Emelyn), infiltrative processes such as
mastocytosis/amyloidosis/infiltrative tumor, extrahepatic
cholestasis, in the post-prandial state, and liver transplantation.
Correlation with patient history, laboratory data, and clinical
condition recommended.
IMPRESSION: ULTRASOUND ABDOMEN:
Negative limited right upper quadrant ultrasound. No hepatobiliary
abnormality identified.

ULTRASOUND HEPATIC ELASTOGRAHY:

Median hepatic shear wave velocity is calculated at 0.64 m/sec.

Corresponding Metavir fibrosis score is  F0/F1.

Risk of fibrosis is Minimal.

Follow-up: None required

## 2017-08-10 ENCOUNTER — Other Ambulatory Visit: Payer: Self-pay

## 2017-10-29 ENCOUNTER — Other Ambulatory Visit: Payer: Self-pay

## 2017-10-29 ENCOUNTER — Emergency Department
Admission: EM | Admit: 2017-10-29 | Discharge: 2017-10-29 | Disposition: A | Discharge status: Home / Self Care | Payer: Self-pay | Attending: Emergency Medicine | Admitting: Emergency Medicine

## 2017-10-29 ENCOUNTER — Emergency Department: Payer: Self-pay

## 2017-10-29 ENCOUNTER — Encounter: Payer: Self-pay | Admitting: Emergency Medicine

## 2017-10-29 DIAGNOSIS — F17210 Nicotine dependence, cigarettes, uncomplicated: Secondary | ICD-10-CM | POA: Insufficient documentation

## 2017-10-29 DIAGNOSIS — Z79899 Other long term (current) drug therapy: Secondary | ICD-10-CM | POA: Insufficient documentation

## 2017-10-29 DIAGNOSIS — J4541 Moderate persistent asthma with (acute) exacerbation: Secondary | ICD-10-CM | POA: Insufficient documentation

## 2017-10-29 DIAGNOSIS — I509 Heart failure, unspecified: Secondary | ICD-10-CM | POA: Insufficient documentation

## 2017-10-29 MED ORDER — HYDROCOD POLST-CPM POLST ER 10-8 MG/5ML PO SUER
5.0000 mL | Freq: Once | ORAL | Status: AC
  Administered 2017-10-29: 5 mL via ORAL
  Filled 2017-10-29: qty 5

## 2017-10-29 MED ORDER — IPRATROPIUM-ALBUTEROL 0.5-2.5 (3) MG/3ML IN SOLN
3.0000 mL | Freq: Once | RESPIRATORY_TRACT | Status: AC
  Administered 2017-10-29: 3 mL via RESPIRATORY_TRACT
  Filled 2017-10-29: qty 3

## 2017-10-29 MED ORDER — PREDNISONE 10 MG PO TABS: 50.0000 mg | ORAL_TABLET | Freq: Every day | ORAL | 0 refills | Status: DC

## 2017-10-29 MED ORDER — ALBUTEROL SULFATE (2.5 MG/3ML) 0.083% IN NEBU: 2.5000 mg | INHALATION_SOLUTION | Freq: Four times a day (QID) | RESPIRATORY_TRACT | 12 refills | Status: DC | PRN

## 2017-10-29 MED ORDER — METHYLPREDNISOLONE SODIUM SUCC 125 MG IJ SOLR
125.0000 mg | Freq: Once | INTRAMUSCULAR | Status: AC
  Administered 2017-10-29: 125 mg via INTRAMUSCULAR
  Filled 2017-10-29: qty 2

## 2017-10-29 MED ORDER — IPRATROPIUM BROMIDE 0.02 % IN SOLN: 0.5000 mg | Freq: Four times a day (QID) | RESPIRATORY_TRACT | 12 refills | Status: DC

## 2017-10-29 MED ORDER — GUAIFENESIN-CODEINE 100-10 MG/5ML PO SOLN: 10.0000 mL | Freq: Three times a day (TID) | ORAL | 0 refills | Status: DC | PRN

## 2017-10-29 NOTE — ED Provider Notes (Signed)
[facility] Emergency Department Provider Note  ____________________________________________  Time seen: Approximately 6:54 PM  I have reviewed the triage vital signs and the nursing notes.   HISTORY  Chief Complaint Cough   HPI Jennifer Vasquez is a 32 y.o. female who presents to the emergency department for treatment and evaluation of cough and shortness of breath x 2 days. She has a history of asthma with no relief from her albuterol. Cough is dry and nonproductive.      Past Medical History:  Diagnosis Date  . Acute carpal tunnel syndrome 10/10/2014  . Anxiety state 09/03/2009  . Asthma   . Bipolar disorder (HCC) 02/09/2013  . CHF (congestive heart failure) (HCC)   . COPD (chronic obstructive pulmonary disease) (HCC)   . DDD (degenerative disc disease), cervical 11/13/2014  . DDD (degenerative disc disease), lumbar 11/13/2014  . Hepatitis C   . Lumbosacral radiculopathy 11/13/2014  . Tobacco use disorder 09/06/2012    Patient Active Problem List   Diagnosis Date Noted  . Community acquired pneumonia of left lung   . Respiratory failure (HCC) 11/05/2016  . DDD (degenerative disc disease), lumbar 11/13/2014  . DDD (degenerative disc disease), cervical 11/13/2014  . Lumbosacral radiculopathy 11/13/2014  . Acute carpal tunnel syndrome 10/10/2014  . Chronic pain 03/02/2013  . Depression 03/02/2013  . Bipolar disorder (HCC) 02/09/2013  . Hepatitis C 12/18/2012  . Asthma 11/23/2012  . Neck pain 11/23/2012  . Torticollis 11/23/2012  . Tobacco use disorder 09/06/2012  . Anxiety state 09/03/2009    Past Surgical History:  Procedure Laterality Date  . CERVICAL DISC ARTHROPLASTY    . TONSILLECTOMY    . TUBAL LIGATION      Prior to Admission medications   Medication Sig Start Date End Date Taking? Authorizing Provider  albuterol (PROVENTIL) (2.5 MG/3ML) 0.083% nebulizer solution Take 3 mLs (2.5 mg total) by nebulization every 6 (six) hours as needed for  wheezing or shortness of breath. 10/29/17   Nelsy Madonna, Rulon Eisenmenger B, FNP  albuterol-ipratropium (COMBIVENT) 18-103 MCG/ACT inhaler Inhale 1 puff into the lungs 4 (four) times daily as needed.     [provider]  azithromycin (ZITHROMAX Z-PAK) 250 MG tablet Take 2 tablets (500 mg) on  Day 1,  followed by 1 tablet (250 mg) once daily on Days 2 through 5. 03/21/17   Emily Filbert, MD  chlorpheniramine-HYDROcodone [facility] PENNKINETIC ER) 10-8 MG/5ML SUER Take 5 mLs by mouth 2 (two) times daily. 03/21/17   Emily Filbert, MD  citalopram (CELEXA) 20 MG tablet Take 1 tablet by mouth daily. 11/25/16   [provider]  cyclobenzaprine (FLEXERIL) 10 MG tablet 3 (three) times daily.  06/04/16   [provider]  fluticasone (FLONASE) 50 MCG/ACT nasal spray 2 sprays daily as needed.  06/11/16   [provider]  Fluticasone-Salmeterol (ADVAIR DISKUS) 250-50 MCG/DOSE AEPB 1 inhalation 2 (two) times daily. 04/01/14   [provider]  guaiFENesin-codeine 100-10 MG/5ML syrup Take 10 mLs by mouth 3 (three) times daily as needed. 10/29/17   Romulo Okray B, FNP  ibuprofen (ADVIL,MOTRIN) 800 MG tablet Take 800 mg by mouth every 6 (six) hours as needed for fever or mild pain.     [provider]  ipratropium (ATROVENT) 0.02 % nebulizer solution Take 2.5 mLs (0.5 mg total) by nebulization 4 (four) times daily. 10/29/17   Timothey Dahlstrom B, FNP  levothyroxine (SYNTHROID, LEVOTHROID) 25 MCG tablet Take 25 mcg by mouth daily before breakfast.  [provider]  nystatin (MYCOSTATIN) 100000 UNIT/ML suspension Use as directed 5 mLs (500,000 Units total) in the mouth or throat 4 (four) times daily. Patient not taking: Reported on 03/21/2017 11/15/16   Milagros Loll, MD  ondansetron (ZOFRAN ODT) 4 MG disintegrating tablet Take 1 tablet (4 mg total) by mouth every 8 (eight) hours as needed for nausea or vomiting. 01/13/17   Midge Minium, MD  potassium chloride (K-DUR) 10  MEQ tablet Take 1 tablet (10 mEq total) by mouth daily. Patient not taking: Reported on 03/21/2017 11/15/16   Milagros Loll, MD  predniSONE (DELTASONE) 10 MG tablet Take 5 tablets (50 mg total) by mouth daily. 10/29/17   Harshini Trent, Rulon Eisenmenger B, FNP  traZODone (DESYREL) 50 MG tablet Take 50 mg by mouth at bedtime as needed for sleep.    [provider]    Allergies Acetaminophen; Amoxicillin-pot clavulanate; Gabapentin; Tetanus toxoids; Tramadol; and Shellfish allergy  Family History  Problem Relation Age of Onset  . Arthritis Mother   . Asthma Mother   . Diabetes Mother   . Hyperlipidemia Mother   . Vision loss Mother   . Alcohol abuse Father   . Asthma Father   . Diabetes Father   . Heart disease Father   . Depression Father   . COPD Father   . Cancer Father   . Hypertension Father   . Kidney disease Father   . Vision loss Father     Social History Social History   Tobacco Use  . Smoking status: Current Some Day Smoker    Packs/day: 0.50    Types: Cigarettes  . Smokeless tobacco: Never Used  Substance Use Topics  . Alcohol use: No    Alcohol/week: 0.0 oz  . Drug use: No    Review of Systems Constitutional: Negative for fever/chills ENT: No sore throat. Cardiovascular: Denies chest pain. Respiratory: Positive for shortness of breath. Positive for cough. Gastrointestinal: Negative for nausea,  no vomiting.  No diarrhea.  Musculoskeletal: Negative for body aches Skin: Negative for rash. Neurological: Negative for headaches ____________________________________________   PHYSICAL EXAM:  VITAL SIGNS: ED Triage Vitals  Enc Vitals Group     BP 10/29/17 1824 (!) 140/100     Pulse Rate 10/29/17 1824 94     Resp 10/29/17 1824 (!) 24     Temp 10/29/17 1824 97.7 F (36.5 C)     Temp Source 10/29/17 1824 Oral     SpO2 10/29/17 1824 95 %     Weight 10/29/17 1825 180 lb (81.6 kg)     Height 10/29/17 1825 5\' 3"  (1.6 m)     Head Circumference --      Peak Flow --       Pain Score 10/29/17 1825 8     Pain Loc --      Pain Edu? --      Excl. in GC? --     Constitutional: Alert and oriented. Well appearing and in no acute distress. Eyes: Conjunctivae are normal. EOMI. Ears: Bilateral TM normal Nose: No congestion noted; no rhinnorhea. Mouth/Throat: Mucous membranes are moist.  Oropharynx clear. Tonsils unremarkable. Neck: No stridor.  Lymphatic: No cervical lymphadenopathy. Cardiovascular: Normal rate, regular rhythm. Good peripheral circulation. Respiratory: Normal respiratory effort.  No retractions. Diminished throughout. Gastrointestinal: Soft and nontender.  Musculoskeletal: FROM x 4 extremities.  Neurologic:  Normal speech and language.  Skin:  Skin is warm, dry and intact. No rash noted. Psychiatric: Mood and affect are normal. Speech and behavior are  normal.  ____________________________________________   LABS (all labs ordered are listed, but only abnormal results are displayed)  Labs Reviewed - No data to display ____________________________________________  EKG  Not indicated. ____________________________________________  RADIOLOGY  Chest x-ray negative for acute abnormality. ____________________________________________   PROCEDURES  Procedure(s) performed: None  Critical Care performed: No ____________________________________________   INITIAL IMPRESSION / ASSESSMENT AND PLAN / ED COURSE  32 y.o. female who presents to the emergency department for treatment and evaluation of cough and shortness of breath.  While in the emergency department, she received 2 DuoNeb treatments and IM Solu-Medrol with significant improvement.  Upon reexamination, air exchange had significantly improved with a slight expiratory wheeze in the bilateral bases.  She will be given prescriptions for albuterol and DuoNeb to be used in her nebulizer.  She was encouraged to return to the ER for symptoms that change or worsen if unable to schedule  an appointment.  Medications  ipratropium-albuterol (DUONEB) 0.5-2.5 (3) MG/3ML nebulizer solution 3 mL (3 mLs Nebulization Given 10/29/17 1829)  ipratropium-albuterol (DUONEB) 0.5-2.5 (3) MG/3ML nebulizer solution 3 mL (3 mLs Nebulization Given 10/29/17 1922)  methylPREDNISolone sodium succinate (SOLU-MEDROL) 125 mg/2 mL injection 125 mg (125 mg Intramuscular Given 10/29/17 1922)  chlorpheniramine-HYDROcodone (TUSSIONEX) 10-8 MG/5ML suspension 5 mL (5 mLs Oral Given 10/29/17 1948)    ED Discharge Orders        Ordered    guaiFENesin-codeine 100-10 MG/5ML syrup  3 times daily PRN     10/29/17 2013    albuterol (PROVENTIL) (2.5 MG/3ML) 0.083% nebulizer solution  Every 6 hours PRN     10/29/17 2013    ipratropium (ATROVENT) 0.02 % nebulizer solution  4 times daily     10/29/17 2013    predniSONE (DELTASONE) 10 MG tablet  Daily     10/29/17 2013       Pertinent labs & imaging results that were available during my care of the patient were reviewed by me and considered in my medical decision making (see chart for details).    If controlled substance prescribed during this visit, 12 month history viewed on the [facility] prior to issuing an initial prescription for Schedule II or III opiod. ____________________________________________   FINAL CLINICAL IMPRESSION(S) / ED DIAGNOSES  Final diagnoses:  Moderate persistent asthma with exacerbation    Note:  This document was prepared using Dragon voice recognition software and may include unintentional dictation errors.     Chinita Pester, FNP 10/29/17 2153    Sharman Cheek, MD 10/29/17 [phone]

## 2017-10-29 NOTE — ED Triage Notes (Signed)
resp unlabored after treatment.

## 2017-10-29 NOTE — ED Triage Notes (Signed)
Cough and SOB x 2 days, states felt like had fever today. History of asthma with no relief with albuterol breathing treatment. Speaking full sentences. Sat 95 room air.

## 2018-01-17 ENCOUNTER — Emergency Department
Admission: EM | Admit: 2018-01-17 | Discharge: 2018-01-17 | Disposition: A | Discharge status: Home / Self Care | Payer: Self-pay | Attending: Emergency Medicine | Admitting: Emergency Medicine

## 2018-01-17 ENCOUNTER — Other Ambulatory Visit: Payer: Self-pay

## 2018-01-17 ENCOUNTER — Encounter: Payer: Self-pay | Admitting: Emergency Medicine

## 2018-01-17 ENCOUNTER — Emergency Department: Payer: Self-pay

## 2018-01-17 DIAGNOSIS — F419 Anxiety disorder, unspecified: Secondary | ICD-10-CM | POA: Insufficient documentation

## 2018-01-17 DIAGNOSIS — F17210 Nicotine dependence, cigarettes, uncomplicated: Secondary | ICD-10-CM | POA: Insufficient documentation

## 2018-01-17 DIAGNOSIS — F319 Bipolar disorder, unspecified: Secondary | ICD-10-CM | POA: Insufficient documentation

## 2018-01-17 DIAGNOSIS — Z79899 Other long term (current) drug therapy: Secondary | ICD-10-CM | POA: Insufficient documentation

## 2018-01-17 DIAGNOSIS — J441 Chronic obstructive pulmonary disease with (acute) exacerbation: Secondary | ICD-10-CM | POA: Insufficient documentation

## 2018-01-17 LAB — CBC WITH DIFFERENTIAL/PLATELET
BASOS ABS: 0.1 10*3/uL (ref 0–0.1)
BASOS PCT: 1 %
Eosinophils Absolute: 0.3 10*3/uL (ref 0–0.7)
Eosinophils Relative: 2 %
HEMATOCRIT: 38.5 % (ref 35.0–47.0)
Hemoglobin: 13.7 g/dL (ref 12.0–16.0)
Lymphocytes Relative: 29 %
Lymphs Abs: 4.7 10*3/uL — ABNORMAL HIGH (ref 1.0–3.6)
MCH: 33.5 pg (ref 26.0–34.0)
MCHC: 35.4 g/dL (ref 32.0–36.0)
MCV: 94.4 fL (ref 80.0–100.0)
MONO ABS: 1.2 10*3/uL — AB (ref 0.2–0.9)
Monocytes Relative: 7 %
NEUTROS ABS: 10 10*3/uL — AB (ref 1.4–6.5)
NEUTROS PCT: 61 %
PLATELETS: 231 10*3/uL (ref 150–440)
RBC: 4.08 MIL/uL (ref 3.80–5.20)
RDW: 13 % (ref 11.5–14.5)
WBC: 16.2 10*3/uL — ABNORMAL HIGH (ref 3.6–11.0)

## 2018-01-17 LAB — BASIC METABOLIC PANEL
ANION GAP: 10 (ref 5–15)
BUN: 16 mg/dL (ref 6–20)
CALCIUM: 9.1 mg/dL (ref 8.9–10.3)
CO2: 25 mmol/L (ref 22–32)
Chloride: 106 mmol/L (ref 98–111)
Creatinine, Ser: 0.85 mg/dL (ref 0.44–1.00)
Glucose, Bld: 100 mg/dL — ABNORMAL HIGH (ref 70–99)
Potassium: 3.8 mmol/L (ref 3.5–5.1)
SODIUM: 141 mmol/L (ref 135–145)

## 2018-01-17 LAB — BRAIN NATRIURETIC PEPTIDE: B NATRIURETIC PEPTIDE 5: 11 pg/mL (ref 0.0–100.0)

## 2018-01-17 LAB — TROPONIN I

## 2018-01-17 MED ORDER — ALBUTEROL SULFATE HFA 108 (90 BASE) MCG/ACT IN AERS: 2.0000 | INHALATION_SPRAY | Freq: Four times a day (QID) | RESPIRATORY_TRACT | 0 refills | Status: DC | PRN

## 2018-01-17 MED ORDER — IPRATROPIUM-ALBUTEROL 0.5-2.5 (3) MG/3ML IN SOLN
3.0000 mL | Freq: Once | RESPIRATORY_TRACT | Status: AC
  Administered 2018-01-17: 3 mL via RESPIRATORY_TRACT
  Filled 2018-01-17: qty 3

## 2018-01-17 MED ORDER — BENZONATATE 100 MG PO CAPS: 100.0000 mg | ORAL_CAPSULE | Freq: Four times a day (QID) | ORAL | 0 refills | Status: DC | PRN

## 2018-01-17 MED ORDER — AZITHROMYCIN 500 MG PO TABS
500.0000 mg | ORAL_TABLET | Freq: Once | ORAL | Status: AC
  Administered 2018-01-17: 500 mg via ORAL
  Filled 2018-01-17: qty 1

## 2018-01-17 MED ORDER — PREDNISONE 50 MG PO TABS: 50.0000 mg | ORAL_TABLET | Freq: Every day | ORAL | 0 refills | Status: AC

## 2018-01-17 MED ORDER — AZITHROMYCIN 250 MG PO TABS: ORAL_TABLET | ORAL | 0 refills | Status: DC

## 2018-01-17 MED ORDER — LIDOCAINE HCL (PF) 1 % IJ SOLN
5.0000 mL | Freq: Once | INTRAMUSCULAR | Status: AC
  Administered 2018-01-17: 5 mL
  Filled 2018-01-17: qty 5

## 2018-01-17 NOTE — ED Triage Notes (Addendum)
Patient ambulatory to triage with steady gait, without difficulty or distress noted; pt reports nonprod cough since 7/3 with SHOB; st hx asthma, COPD and CHF; reports hurting all over from coughing

## 2018-01-17 NOTE — Discharge Instructions (Signed)
Please take your steroids and antibiotics as prescribed and follow-up with primary care within 2 days for recheck.  Return to the emergency department sooner for any new or worsening symptoms such as worsening shortness of breath, fevers, chills, or for any other issues whatsoever.  It was a pleasure to take care of you today, and thank you for coming to our emergency department.  If you have any questions or concerns before leaving please ask the nurse to grab me and I'm more than happy to go through your aftercare instructions again.  If you were prescribed any opioid pain medication today such as Norco, Vicodin, Percocet, morphine, hydrocodone, or oxycodone please make sure you do not drive when you are taking this medication as it can alter your ability to drive safely.  If you have any concerns once you are home that you are not improving or are in fact getting worse before you can make it to your follow-up appointment, please do not hesitate to call [phone] and come back for further evaluation.  Merrily Brittle, MD  Results for orders placed or performed during the hospital encounter of 01/17/18  CBC with Differential  Result Value Ref Range   WBC 16.2 (H) 3.6 - 11.0 K/uL   RBC 4.08 3.80 - 5.20 MIL/uL   Hemoglobin 13.7 12.0 - 16.0 g/dL   HCT 62.8 63.8 - 17.7 %   MCV 94.4 80.0 - 100.0 fL   MCH 33.5 26.0 - 34.0 pg   MCHC 35.4 32.0 - 36.0 g/dL   RDW 11.6 57.9 - 03.8 %   Platelets 231 150 - 440 K/uL   Neutrophils Relative % 61 %   Neutro Abs 10.0 (H) 1.4 - 6.5 K/uL   Lymphocytes Relative 29 %   Lymphs Abs 4.7 (H) 1.0 - 3.6 K/uL   Monocytes Relative 7 %   Monocytes Absolute 1.2 (H) 0.2 - 0.9 K/uL   Eosinophils Relative 2 %   Eosinophils Absolute 0.3 0 - 0.7 K/uL   Basophils Relative 1 %   Basophils Absolute 0.1 0 - 0.1 K/uL  Basic metabolic panel  Result Value Ref Range   Sodium 141 135 - 145 mmol/L   Potassium 3.8 3.5 - 5.1 mmol/L   Chloride 106 98 - 111 mmol/L   CO2 25 22 - 32  mmol/L   Glucose, Bld 100 (H) 70 - 99 mg/dL   BUN 16 6 - 20 mg/dL   Creatinine, Ser 3.33 0.44 - 1.00 mg/dL   Calcium 9.1 8.9 - 83.2 mg/dL   GFR calc non Af Amer >60 >60 mL/min   GFR calc Af Amer >60 >60 mL/min   Anion gap 10 5 - 15  Troponin I  Result Value Ref Range   Troponin I <0.03 <0.03 ng/mL  Brain natriuretic peptide  Result Value Ref Range   B Natriuretic Peptide 11.0 0.0 - 100.0 pg/mL   Dg Chest 2 View  Result Date: 01/17/2018 CLINICAL DATA:  Shortness of breath.  Nonproductive cough. EXAM: CHEST - 2 VIEW COMPARISON:  Radiographs 10/29/2017 FINDINGS: The cardiomediastinal contours are normal. Mild chronic bronchitic change. Pulmonary vasculature is normal. No consolidation, pleural effusion, or pneumothorax. No acute osseous abnormalities are seen. IMPRESSION: No acute findings. Electronically Signed   By: Rubye Oaks M.D.   On: 01/17/2018 04:50

## 2018-01-17 NOTE — ED Notes (Signed)
Pt ambulatory to the lobby at time of D/C. Denies any comments/concerns regarding D/C. Return precautions reviewed with patient by this RN.

## 2018-01-17 NOTE — ED Provider Notes (Signed)
[facility] Emergency Department Provider Note  ____________________________________________   First MD Initiated Contact with Patient 01/17/18 0507     (approximate)  I have reviewed the triage vital signs and the nursing notes.   HISTORY  Chief Complaint Cough and Shortness of Breath   HPI Jennifer Vasquez is a 32 y.o. female who self presents to the emergency department with several days of increasingly productive cough and shortness of breath.  She has a long-standing history of asthma since childhood that is been exacerbated by COPD as an adult.  She does not use oxygen at home.  She did use a nebulizer today however ran out of her inhaler.  Her primary concern is her cough which she cannot stop.  She has sharp moderate severity nonradiating diffuse upper chest pain worse when coughing improved and not coughing.  No fevers or chills.   Past Medical History:  Diagnosis Date  . Acute carpal tunnel syndrome 10/10/2014  . Anxiety state 09/03/2009  . Asthma   . Bipolar disorder (HCC) 02/09/2013  . CHF (congestive heart failure) (HCC)   . COPD (chronic obstructive pulmonary disease) (HCC)   . DDD (degenerative disc disease), cervical 11/13/2014  . DDD (degenerative disc disease), lumbar 11/13/2014  . Hepatitis C   . Lumbosacral radiculopathy 11/13/2014  . Tobacco use disorder 09/06/2012    Patient Active Problem List   Diagnosis Date Noted  . Community acquired pneumonia of left lung   . Respiratory failure (HCC) 11/05/2016  . DDD (degenerative disc disease), lumbar 11/13/2014  . DDD (degenerative disc disease), cervical 11/13/2014  . Lumbosacral radiculopathy 11/13/2014  . Acute carpal tunnel syndrome 10/10/2014  . Chronic pain 03/02/2013  . Depression 03/02/2013  . Bipolar disorder (HCC) 02/09/2013  . Hepatitis C 12/18/2012  . Asthma 11/23/2012  . Neck pain 11/23/2012  . Torticollis 11/23/2012  . Tobacco use disorder 09/06/2012  . Anxiety state  09/03/2009    Past Surgical History:  Procedure Laterality Date  . CERVICAL DISC ARTHROPLASTY    . TONSILLECTOMY    . TUBAL LIGATION      Prior to Admission medications   Medication Sig Start Date End Date Taking? Authorizing Provider  albuterol (PROVENTIL HFA;VENTOLIN HFA) 108 (90 Base) MCG/ACT inhaler Inhale 2 puffs into the lungs every 6 (six) hours as needed for wheezing or shortness of breath. 01/17/18   Merrily Brittle, MD  albuterol-ipratropium (COMBIVENT) 18-103 MCG/ACT inhaler Inhale 1 puff into the lungs 4 (four) times daily as needed.     [provider]  azithromycin (ZITHROMAX Z-PAK) 250 MG tablet Take 2 tablets (500 mg) on  Day 1,  followed by 1 tablet (250 mg) once daily on Days 2 through 5. 01/17/18   Merrily Brittle, MD  benzonatate (TESSALON PERLES) 100 MG capsule Take 1 capsule (100 mg total) by mouth every 6 (six) hours as needed for cough. 01/17/18 01/17/19  Merrily Brittle, MD  chlorpheniramine-HYDROcodone (TUSSIONEX PENNKINETIC ER) 10-8 MG/5ML SUER Take 5 mLs by mouth 2 (two) times daily. 03/21/17   Emily Filbert, MD  citalopram (CELEXA) 20 MG tablet Take 1 tablet by mouth daily. 11/25/16   [provider]  cyclobenzaprine (FLEXERIL) 10 MG tablet 3 (three) times daily.  06/04/16   [provider]  fluticasone (FLONASE) 50 MCG/ACT nasal spray 2 sprays daily as needed.  06/11/16   [provider]  Fluticasone-Salmeterol (ADVAIR DISKUS) 250-50 MCG/DOSE AEPB 1 inhalation 2 (two) times daily. 04/01/14   [provider]  guaiFENesin-codeine  100-10 MG/5ML syrup Take 10 mLs by mouth 3 (three) times daily as needed. 10/29/17   Triplett, Cari B, FNP  ibuprofen (ADVIL,MOTRIN) 800 MG tablet Take 800 mg by mouth every 6 (six) hours as needed for fever or mild pain.     [provider]  ipratropium (ATROVENT) 0.02 % nebulizer solution Take 2.5 mLs (0.5 mg total) by nebulization 4 (four) times daily. 10/29/17   Triplett, Rulon Eisenmenger B, FNP    levothyroxine (SYNTHROID, LEVOTHROID) 25 MCG tablet Take 25 mcg by mouth daily before breakfast.    [provider]  nystatin (MYCOSTATIN) 100000 UNIT/ML suspension Use as directed 5 mLs (500,000 Units total) in the mouth or throat 4 (four) times daily. Patient not taking: Reported on 03/21/2017 11/15/16   Milagros Loll, MD  ondansetron (ZOFRAN ODT) 4 MG disintegrating tablet Take 1 tablet (4 mg total) by mouth every 8 (eight) hours as needed for nausea or vomiting. 01/13/17   Midge Minium, MD  potassium chloride (K-DUR) 10 MEQ tablet Take 1 tablet (10 mEq total) by mouth daily. Patient not taking: Reported on 03/21/2017 11/15/16   Milagros Loll, MD  predniSONE (DELTASONE) 50 MG tablet Take 1 tablet (50 mg total) by mouth daily for 4 days. 01/17/18 01/21/18  Merrily Brittle, MD  traZODone (DESYREL) 50 MG tablet Take 50 mg by mouth at bedtime as needed for sleep.    [provider]    Allergies Acetaminophen; Amoxicillin-pot clavulanate; Gabapentin; Tetanus toxoids; Tramadol; and Shellfish allergy  Family History  Problem Relation Age of Onset  . Arthritis Mother   . Asthma Mother   . Diabetes Mother   . Hyperlipidemia Mother   . Vision loss Mother   . Alcohol abuse Father   . Asthma Father   . Diabetes Father   . Heart disease Father   . Depression Father   . COPD Father   . Cancer Father   . Hypertension Father   . Kidney disease Father   . Vision loss Father     Social History Social History   Tobacco Use  . Smoking status: Current Some Day Smoker    Packs/day: 0.50    Types: Cigarettes  . Smokeless tobacco: Never Used  Substance Use Topics  . Alcohol use: No    Alcohol/week: 0.0 oz  . Drug use: No    Review of Systems Constitutional: No fever/chills Eyes: No visual changes. ENT: No sore throat. Cardiovascular: Positive for chest pain. Respiratory: Positive for shortness of breath. Gastrointestinal: No abdominal pain.  No nausea, no vomiting.  No  diarrhea.  No constipation. Genitourinary: Negative for dysuria. Musculoskeletal: Negative for back pain. Skin: Negative for rash. Neurological: Negative for headaches, focal weakness or numbness.   ____________________________________________   PHYSICAL EXAM:  VITAL SIGNS: ED Triage Vitals  Enc Vitals Group     BP 01/17/18 0408 (!) 143/102     Pulse Rate 01/17/18 0408 85     Resp 01/17/18 0408 18     Temp 01/17/18 0408 98.6 F (37 C)     Temp Source 01/17/18 0408 Oral     SpO2 01/17/18 0408 96 %     Weight 01/17/18 0400 180 lb (81.6 kg)     Height 01/17/18 0400 5\' 3"  (1.6 m)     Head Circumference --      Peak Flow --      Pain Score 01/17/18 0400 8     Pain Loc --      Pain Edu? --  Excl. in GC? --     Constitutional: Alert and oriented x4 appears somewhat uncomfortable actively coughing during my exam Eyes: PERRL EOMI. Head: Atraumatic. Nose: No congestion/rhinnorhea. Mouth/Throat: No trismus Neck: No stridor.   Cardiovascular: Normal rate, regular rhythm. Grossly normal heart sounds.  Good peripheral circulation. Respiratory: Somewhat increased respiratory effort although no accessory muscle use and able to speak in short sentences.  Expiratory wheezes throughout but moving good air Gastrointestinal: Soft nontender Musculoskeletal: No lower extremity edema   Neurologic:  Normal speech and language. No gross focal neurologic deficits are appreciated. Skin:  Skin is warm, dry and intact. No rash noted. Psychiatric: Mood and affect are normal. Speech and behavior are normal.    ____________________________________________   DIFFERENTIAL includes but not limited to  COPD exacerbation, pneumonia, pneumothorax, pulmonary embolism ____________________________________________   LABS (all labs ordered are listed, but only abnormal results are displayed)  Labs Reviewed  CBC WITH DIFFERENTIAL/PLATELET - Abnormal; Notable for the following components:       Result Value   WBC 16.2 (*)    Neutro Abs 10.0 (*)    Lymphs Abs 4.7 (*)    Monocytes Absolute 1.2 (*)    All other components within normal limits  BASIC METABOLIC PANEL - Abnormal; Notable for the following components:   Glucose, Bld 100 (*)    All other components within normal limits  TROPONIN I  BRAIN NATRIURETIC PEPTIDE    Lab work reviewed by me with elevated white count which is nonspecific but concerning for acute infection __________________________________________  EKG  ED ECG REPORT I, Merrily Brittle, the attending physician, personally viewed and interpreted this ECG.  Date: 01/17/2018 EKG Time:  Rate: 93 Rhythm: normal sinus rhythm QRS Axis: normal Intervals: normal ST/T Wave abnormalities: normal Narrative Interpretation: no evidence of acute ischemia  ____________________________________________  RADIOLOGY  Chest x-ray reviewed by me with no acute disease ____________________________________________   PROCEDURES  Procedure(s) performed: no  Procedures  Critical Care performed: no  ____________________________________________   INITIAL IMPRESSION / ASSESSMENT AND PLAN / ED COURSE  Pertinent labs & imaging results that were available during my care of the patient were reviewed by me and considered in my medical decision making (see chart for details).   The patient arrives somewhat short of breath although saturating well.  Given 3 breathing treatments along with steroids and azithromycin with improvement in her symptoms.  She declines further breathing treatments because it made her quite tremulous.  At this point I will treat her with a total of 5 days of steroids and azithromycin and refill her albuterol.  2-day primary care recheck and strict return precautions have been given.      ____________________________________________   FINAL CLINICAL IMPRESSION(S) / ED DIAGNOSES  Final diagnoses:  COPD exacerbation (HCC)      NEW  MEDICATIONS STARTED DURING THIS VISIT:  New Prescriptions   ALBUTEROL (PROVENTIL HFA;VENTOLIN HFA) 108 (90 BASE) MCG/ACT INHALER    Inhale 2 puffs into the lungs every 6 (six) hours as needed for wheezing or shortness of breath.   AZITHROMYCIN (ZITHROMAX Z-PAK) 250 MG TABLET    Take 2 tablets (500 mg) on  Day 1,  followed by 1 tablet (250 mg) once daily on Days 2 through 5.   BENZONATATE (TESSALON PERLES) 100 MG CAPSULE    Take 1 capsule (100 mg total) by mouth every 6 (six) hours as needed for cough.   PREDNISONE (DELTASONE) 50 MG TABLET    Take 1 tablet (50 mg  total) by mouth daily for 4 days.     Note:  This document was prepared using Dragon voice recognition software and may include unintentional dictation errors.     Merrily Brittle, MD 01/17/18 [phone]

## 2018-04-05 ENCOUNTER — Emergency Department
Admission: EM | Admit: 2018-04-05 | Discharge: 2018-04-05 | Disposition: A | Discharge status: Home / Self Care | Payer: Self-pay | Attending: Emergency Medicine | Admitting: Emergency Medicine

## 2018-04-05 ENCOUNTER — Other Ambulatory Visit: Payer: Self-pay

## 2018-04-05 ENCOUNTER — Emergency Department: Payer: Self-pay

## 2018-04-05 ENCOUNTER — Encounter: Payer: Self-pay | Admitting: *Deleted

## 2018-04-05 DIAGNOSIS — F17210 Nicotine dependence, cigarettes, uncomplicated: Secondary | ICD-10-CM | POA: Insufficient documentation

## 2018-04-05 DIAGNOSIS — J441 Chronic obstructive pulmonary disease with (acute) exacerbation: Secondary | ICD-10-CM | POA: Insufficient documentation

## 2018-04-05 DIAGNOSIS — F319 Bipolar disorder, unspecified: Secondary | ICD-10-CM | POA: Insufficient documentation

## 2018-04-05 DIAGNOSIS — F419 Anxiety disorder, unspecified: Secondary | ICD-10-CM | POA: Insufficient documentation

## 2018-04-05 DIAGNOSIS — Z79899 Other long term (current) drug therapy: Secondary | ICD-10-CM | POA: Insufficient documentation

## 2018-04-05 LAB — COMPREHENSIVE METABOLIC PANEL
ALK PHOS: 67 U/L (ref 38–126)
ALT: 13 U/L (ref 0–44)
ANION GAP: 8 (ref 5–15)
AST: 18 U/L (ref 15–41)
Albumin: 4 g/dL (ref 3.5–5.0)
BILIRUBIN TOTAL: 0.3 mg/dL (ref 0.3–1.2)
BUN: 8 mg/dL (ref 6–20)
CALCIUM: 8.8 mg/dL — AB (ref 8.9–10.3)
CO2: 26 mmol/L (ref 22–32)
CREATININE: 0.59 mg/dL (ref 0.44–1.00)
Chloride: 103 mmol/L (ref 98–111)
GFR calc Af Amer: >60 mL/min (ref >60)
GFR calc non Af Amer: >60 mL/min (ref >60)
GLUCOSE: 101 mg/dL — AB (ref 70–99)
Potassium: 4 mmol/L (ref 3.5–5.1)
Sodium: 137 mmol/L (ref 135–145)
TOTAL PROTEIN: 6.9 g/dL (ref 6.5–8.1)

## 2018-04-05 LAB — BRAIN NATRIURETIC PEPTIDE: B Natriuretic Peptide: 44 pg/mL (ref 0.0–100.0)

## 2018-04-05 LAB — CBC
HCT: 38.3 % (ref 35.0–47.0)
Hemoglobin: 13.3 g/dL (ref 12.0–16.0)
MCH: 33.5 pg (ref 26.0–34.0)
MCHC: 34.7 g/dL (ref 32.0–36.0)
MCV: 96.5 fL (ref 80.0–100.0)
Platelets: 242 10*3/uL (ref 150–440)
RBC: 3.97 MIL/uL (ref 3.80–5.20)
RDW: 13.5 % (ref 11.5–14.5)
WBC: 13.4 10*3/uL — AB (ref 3.6–11.0)

## 2018-04-05 LAB — TROPONIN I: Troponin I: <0.03 ng/mL (ref <0.03)

## 2018-04-05 MED ORDER — METHYLPREDNISOLONE SODIUM SUCC 125 MG IJ SOLR
INTRAMUSCULAR | Status: AC
  Administered 2018-04-05: 125 mg via INTRAVENOUS
  Filled 2018-04-05: qty 2

## 2018-04-05 MED ORDER — IPRATROPIUM-ALBUTEROL 0.5-2.5 (3) MG/3ML IN SOLN
3.0000 mL | Freq: Once | RESPIRATORY_TRACT | Status: AC
  Administered 2018-04-05: 3 mL via RESPIRATORY_TRACT

## 2018-04-05 MED ORDER — AZITHROMYCIN 500 MG PO TABS: 500.0000 mg | ORAL_TABLET | Freq: Every day | ORAL | 0 refills | Status: AC

## 2018-04-05 MED ORDER — IPRATROPIUM-ALBUTEROL 0.5-2.5 (3) MG/3ML IN SOLN
RESPIRATORY_TRACT | Status: AC
  Filled 2018-04-05: qty 6

## 2018-04-05 MED ORDER — METHYLPREDNISOLONE SODIUM SUCC 125 MG IJ SOLR
125.0000 mg | Freq: Once | INTRAMUSCULAR | Status: AC
  Administered 2018-04-05: 125 mg via INTRAVENOUS

## 2018-04-05 MED ORDER — IPRATROPIUM-ALBUTEROL 0.5-2.5 (3) MG/3ML IN SOLN: 6.0000 mL | Freq: Once | RESPIRATORY_TRACT | Status: DC

## 2018-04-05 MED ORDER — PREDNISONE 20 MG PO TABS: 20.0000 mg | ORAL_TABLET | Freq: Every day | ORAL | 0 refills | Status: AC

## 2018-04-05 NOTE — ED Triage Notes (Signed)
Pt to triage via wheelchair.  Pt has sob for 2 days with a cough.  cig smoker.  No fever.  Pt also reports mid chest pain radiating into the back.  Pt alert. Speech clear.

## 2018-04-05 NOTE — ED Provider Notes (Signed)
[facility] Emergency Department Provider Note    First MD Initiated Contact with Patient 04/05/18 0143     (approximate)  I have reviewed the triage vital signs and the nursing notes.   HISTORY  Chief Complaint Shortness of Breath    HPI Jennifer Vasquez is a 32 y.o. female with below list of chronic medical conditions including CHF and COPD presents to the emergency department with 2-day history of nonproductive cough and dyspnea.  Patient denies any fever.  Past Medical History:  Diagnosis Date  . Acute carpal tunnel syndrome 10/10/2014  . Anxiety state 09/03/2009  . Asthma   . Bipolar disorder (HCC) 02/09/2013  . CHF (congestive heart failure) (HCC)   . COPD (chronic obstructive pulmonary disease) (HCC)   . DDD (degenerative disc disease), cervical 11/13/2014  . DDD (degenerative disc disease), lumbar 11/13/2014  . Hepatitis C   . Lumbosacral radiculopathy 11/13/2014  . Tobacco use disorder 09/06/2012    Patient Active Problem List   Diagnosis Date Noted  . Community acquired pneumonia of left lung   . Respiratory failure (HCC) 11/05/2016  . DDD (degenerative disc disease), lumbar 11/13/2014  . DDD (degenerative disc disease), cervical 11/13/2014  . Lumbosacral radiculopathy 11/13/2014  . Acute carpal tunnel syndrome 10/10/2014  . Chronic pain 03/02/2013  . Depression 03/02/2013  . Bipolar disorder (HCC) 02/09/2013  . Hepatitis C 12/18/2012  . Asthma 11/23/2012  . Neck pain 11/23/2012  . Torticollis 11/23/2012  . Tobacco use disorder 09/06/2012  . Anxiety state 09/03/2009    Past Surgical History:  Procedure Laterality Date  . CERVICAL DISC ARTHROPLASTY    . TONSILLECTOMY    . TUBAL LIGATION      Prior to Admission medications   Medication Sig Start Date End Date Taking? Authorizing Provider  albuterol (PROVENTIL HFA;VENTOLIN HFA) 108 (90 Base) MCG/ACT inhaler Inhale 2 puffs into the lungs every 6 (six) hours as needed for wheezing or  shortness of breath. 01/17/18   Merrily Brittle, MD  albuterol-ipratropium (COMBIVENT) 18-103 MCG/ACT inhaler Inhale 1 puff into the lungs 4 (four) times daily as needed.     [provider]  azithromycin (ZITHROMAX Z-PAK) 250 MG tablet Take 2 tablets (500 mg) on  Day 1,  followed by 1 tablet (250 mg) once daily on Days 2 through 5. 01/17/18   Merrily Brittle, MD  azithromycin (ZITHROMAX) 500 MG tablet Take 1 tablet (500 mg total) by mouth daily for 3 days. Take 1 tablet daily for 3 days. 04/05/18 04/08/18  Darci Current, MD  benzonatate (TESSALON PERLES) 100 MG capsule Take 1 capsule (100 mg total) by mouth every 6 (six) hours as needed for cough. 01/17/18 01/17/19  Merrily Brittle, MD  chlorpheniramine-HYDROcodone (TUSSIONEX PENNKINETIC ER) 10-8 MG/5ML SUER Take 5 mLs by mouth 2 (two) times daily. 03/21/17   Emily Filbert, MD  citalopram (CELEXA) 20 MG tablet Take 1 tablet by mouth daily. 11/25/16   [provider]  cyclobenzaprine (FLEXERIL) 10 MG tablet 3 (three) times daily.  06/04/16   [provider]  fluticasone (FLONASE) 50 MCG/ACT nasal spray 2 sprays daily as needed.  06/11/16   [provider]  Fluticasone-Salmeterol (ADVAIR DISKUS) 250-50 MCG/DOSE AEPB 1 inhalation 2 (two) times daily. 04/01/14   [provider]  guaiFENesin-codeine 100-10 MG/5ML syrup Take 10 mLs by mouth 3 (three) times daily as needed. 10/29/17   Triplett, Cari B, FNP  ibuprofen (ADVIL,MOTRIN) 800 MG tablet Take 800 mg by mouth every 6 (  six) hours as needed for fever or mild pain.     [provider]  ipratropium (ATROVENT) 0.02 % nebulizer solution Take 2.5 mLs (0.5 mg total) by nebulization 4 (four) times daily. 10/29/17   Triplett, Rulon Eisenmenger B, FNP  levothyroxine (SYNTHROID, LEVOTHROID) 25 MCG tablet Take 25 mcg by mouth daily before breakfast.    [provider]  nystatin (MYCOSTATIN) 100000 UNIT/ML suspension Use as directed 5 mLs (500,000 Units total) in the  mouth or throat 4 (four) times daily. Patient not taking: Reported on 03/21/2017 11/15/16   Milagros Loll, MD  ondansetron (ZOFRAN ODT) 4 MG disintegrating tablet Take 1 tablet (4 mg total) by mouth every 8 (eight) hours as needed for nausea or vomiting. 01/13/17   Midge Minium, MD  potassium chloride (K-DUR) 10 MEQ tablet Take 1 tablet (10 mEq total) by mouth daily. Patient not taking: Reported on 03/21/2017 11/15/16   Milagros Loll, MD  predniSONE (DELTASONE) 20 MG tablet Take 1 tablet (20 mg total) by mouth daily with breakfast for 14 days. 04/05/18 04/19/18  Darci Current, MD  traZODone (DESYREL) 50 MG tablet Take 50 mg by mouth at bedtime as needed for sleep.    [provider]    Allergies Acetaminophen; Amoxicillin-pot clavulanate; Gabapentin; Tetanus toxoids; Tramadol; and Shellfish allergy  Family History  Problem Relation Age of Onset  . Arthritis Mother   . Asthma Mother   . Diabetes Mother   . Hyperlipidemia Mother   . Vision loss Mother   . Alcohol abuse Father   . Asthma Father   . Diabetes Father   . Heart disease Father   . Depression Father   . COPD Father   . Cancer Father   . Hypertension Father   . Kidney disease Father   . Vision loss Father     Social History Social History   Tobacco Use  . Smoking status: Current Some Day Smoker    Packs/day: 0.50    Types: Cigarettes  . Smokeless tobacco: Never Used  Substance Use Topics  . Alcohol use: Yes    Alcohol/week: 0.0 standard drinks  . Drug use: No    Review of Systems Constitutional: No fever/chills Eyes: No visual changes. ENT: No sore throat. Cardiovascular: Denies chest pain. Respiratory: Positive for dyspnea and cough Gastrointestinal: No abdominal pain.  No nausea, no vomiting.  No diarrhea.  No constipation. Genitourinary: Negative for dysuria. Musculoskeletal: Negative for neck pain.  Negative for back pain. Integumentary: Negative for rash. Neurological: Negative for headaches,  focal weakness or numbness.  ____________________________________________   PHYSICAL EXAM:  VITAL SIGNS: ED Triage Vitals  Enc Vitals Group     BP 04/05/18 0127 (!) 143/86     Pulse Rate 04/05/18 0127 (!) 116     Resp 04/05/18 0127 (!) 22     Temp 04/05/18 0127 98.6 F (37 C)     Temp Source 04/05/18 0127 Oral     SpO2 04/05/18 0127 (!) 89 %     Weight 04/05/18 0127 85.3 kg (188 lb)     Height 04/05/18 0127 1.6 m (5\' 3" )     Head Circumference --      Peak Flow --      Pain Score 04/05/18 0133 6     Pain Loc --      Pain Edu? --      Excl. in GC? --     Constitutional: Alert and oriented. Well appearing and in no acute distress. Eyes: Conjunctivae are  normal. PERRL. EOMI. Mouth/Throat: Mucous membranes are moist.  Oropharynx non-erythematous. Neck: No stridor.   Cardiovascular: Normal rate, regular rhythm. Good peripheral circulation. Grossly normal heart sounds. Respiratory: Normal respiratory effort.  No retractions.  Diffuse rhonchi with expiratory wheezes Gastrointestinal: Soft and nontender. No distention.  Musculoskeletal: No lower extremity tenderness nor edema. No gross deformities of extremities. Neurologic:  Normal speech and language. No gross focal neurologic deficits are appreciated.  Skin:  Skin is warm, dry and intact. No rash noted. Psychiatric: Mood and affect are normal. Speech and behavior are normal.  ____________________________________________   LABS (all labs ordered are listed, but only abnormal results are displayed)  Labs Reviewed  CBC - Abnormal; Notable for the following components:      Result Value   WBC 13.4 (*)    All other components within normal limits  COMPREHENSIVE METABOLIC PANEL - Abnormal; Notable for the following components:   Glucose, Bld 101 (*)    Calcium 8.8 (*)    All other components within normal limits  TROPONIN I  BRAIN NATRIURETIC PEPTIDE   ____________________________________________  EKG  ED ECG  REPORT I, [place] Celedonio Sortino, the attending physician, personally viewed and interpreted this ECG.   Date: 04/05/2018  EKG Time: 1:32 AM  Rate: 110  Rhythm: Sinus tachycardia  Axis: Normal  Intervals: Normal  ST&T Change: None  ____________________________________________  RADIOLOGY I, [place] Raechelle Sarti, personally viewed and evaluated these images (plain radiographs) as part of my medical decision making, as well as reviewing the written report by the radiologist.  ED MD interpretation: "Mild congestive heart failure".  Official radiology report(s): Dg Chest Portable 1 View  Result Date: 04/05/2018 CLINICAL DATA:  Dyspnea EXAM: PORTABLE CHEST 1 VIEW COMPARISON:  01/17/2018 chest radiograph. FINDINGS: Surgical hardware from [facility] overlies the lower cervical spine. Stable cardiomediastinal silhouette with mild cardiomegaly. No pneumothorax. No pleural effusion. Mild pulmonary edema. IMPRESSION: Mild congestive heart failure. Electronically Signed   By: Delbert Phenix M.D.   On: 04/05/2018 02:46     Critical Care performed:   .Critical Care Performed by: Darci Current, MD Authorized by: Darci Current, MD   Critical care provider statement:    Critical care time (minutes):  30   Critical care time was exclusive of:  Separately billable procedures and treating other patients   Critical care was necessary to treat or prevent imminent or life-threatening deterioration of the following conditions:  Respiratory failure   Critical care was time spent personally by me on the following activities:  Development of treatment plan with patient or surrogate, discussions with consultants, evaluation of patient's response to treatment, examination of patient, obtaining history from patient or surrogate, ordering and performing treatments and interventions, ordering and review of laboratory studies, ordering and review of radiographic studies, pulse oximetry, re-evaluation of patient's condition  and review of old charts     ____________________________________________   INITIAL IMPRESSION / ASSESSMENT AND PLAN / ED COURSE  As part of my medical decision making, I reviewed the following data within the electronic MEDICAL RECORD NUMBER   32 year old female presenting with above-stated history and physical exam of dyspnea and cough.  Patient initially hypoxic on presentation to the emergency department with oxygen saturation of 89%.  Patient given 2 DuoNeb's in the emergency department as well as 125 mg of Solu-Medrol.  On reevaluation patient states that she feels "much better".  Chest x-ray revealed evidence of mild CHF with BNP currently 44.  Spoke with the patient regarding continued  treatment plan however patient is requesting to be discharged home at this time because she "feels much better".  Patient will be prescribed azithromycin and prednisone for home she requested a 2-week course of prednisone ____________________________________________  FINAL CLINICAL IMPRESSION(S) / ED DIAGNOSES  Final diagnoses:  COPD exacerbation (HCC)     MEDICATIONS GIVEN DURING THIS VISIT:  Medications  methylPREDNISolone sodium succinate (SOLU-MEDROL) 125 mg/2 mL injection 125 mg (125 mg Intravenous Given 04/05/18 0203)  ipratropium-albuterol (DUONEB) 0.5-2.5 (3) MG/3ML nebulizer solution 3 mL (3 mLs Nebulization Given 04/05/18 0205)  ipratropium-albuterol (DUONEB) 0.5-2.5 (3) MG/3ML nebulizer solution 3 mL (3 mLs Nebulization Given 04/05/18 0205)     ED Discharge Orders         Ordered    azithromycin (ZITHROMAX) 500 MG tablet  Daily     04/05/18 0325    predniSONE (DELTASONE) 20 MG tablet  Daily with breakfast     04/05/18 0325           Note:  This document was prepared using Dragon voice recognition software and may include unintentional dictation errors.    Darci Current, MD 04/05/18 [phone]

## 2018-04-09 ENCOUNTER — Encounter: Payer: Self-pay | Admitting: Emergency Medicine

## 2018-04-09 ENCOUNTER — Emergency Department
Admission: EM | Admit: 2018-04-09 | Discharge: 2018-04-09 | Disposition: A | Discharge status: Home / Self Care | Payer: Self-pay | Attending: Emergency Medicine | Admitting: Emergency Medicine

## 2018-04-09 ENCOUNTER — Other Ambulatory Visit: Payer: Self-pay

## 2018-04-09 DIAGNOSIS — R0602 Shortness of breath: Secondary | ICD-10-CM | POA: Insufficient documentation

## 2018-04-09 DIAGNOSIS — Z5321 Procedure and treatment not carried out due to patient leaving prior to being seen by health care provider: Secondary | ICD-10-CM | POA: Insufficient documentation

## 2018-04-09 LAB — BASIC METABOLIC PANEL
ANION GAP: 8 (ref 5–15)
BUN: 21 mg/dL — ABNORMAL HIGH (ref 6–20)
CALCIUM: 8.7 mg/dL — AB (ref 8.9–10.3)
CO2: 30 mmol/L (ref 22–32)
CREATININE: 0.74 mg/dL (ref 0.44–1.00)
Chloride: 103 mmol/L (ref 98–111)
GFR calc non Af Amer: >60 mL/min (ref >60)
Glucose, Bld: 85 mg/dL (ref 70–99)
Potassium: 3.7 mmol/L (ref 3.5–5.1)
SODIUM: 141 mmol/L (ref 135–145)

## 2018-04-09 LAB — CBC
HCT: 37.1 % (ref 35.0–47.0)
HEMOGLOBIN: 13 g/dL (ref 12.0–16.0)
MCH: 33.5 pg (ref 26.0–34.0)
MCHC: 35 g/dL (ref 32.0–36.0)
MCV: 95.8 fL (ref 80.0–100.0)
PLATELETS: 258 10*3/uL (ref 150–440)
RBC: 3.87 MIL/uL (ref 3.80–5.20)
RDW: 13.3 % (ref 11.5–14.5)
WBC: 12.8 10*3/uL — ABNORMAL HIGH (ref 3.6–11.0)

## 2018-04-09 MED ORDER — ALBUTEROL SULFATE (2.5 MG/3ML) 0.083% IN NEBU
5.0000 mg | INHALATION_SOLUTION | Freq: Once | RESPIRATORY_TRACT | Status: AC
  Administered 2018-04-09: 5 mg via RESPIRATORY_TRACT
  Filled 2018-04-09: qty 6

## 2018-04-09 NOTE — ED Triage Notes (Signed)
Patient reports that last night she had increased SOB from baseline and fell asleep and didn't wake up until someone came in to wake her up. Friend with patient states "she was breathing funny all night." Patient with history of COPD and states "I"ve had trouble breathing my whole life." Patient States she's had cough for one month. NO fevers.

## 2018-04-09 NOTE — ED Notes (Signed)
Pt called to room without answer.  

## 2018-04-22 ENCOUNTER — Emergency Department
Admission: EM | Admit: 2018-04-22 | Discharge: 2018-04-22 | Disposition: A | Discharge status: Home / Self Care | Payer: Self-pay | Attending: Emergency Medicine | Admitting: Emergency Medicine

## 2018-04-22 ENCOUNTER — Encounter: Payer: Self-pay | Admitting: Emergency Medicine

## 2018-04-22 ENCOUNTER — Other Ambulatory Visit: Payer: Self-pay

## 2018-04-22 DIAGNOSIS — R5383 Other fatigue: Secondary | ICD-10-CM | POA: Insufficient documentation

## 2018-04-22 DIAGNOSIS — R509 Fever, unspecified: Secondary | ICD-10-CM | POA: Insufficient documentation

## 2018-04-22 DIAGNOSIS — J449 Chronic obstructive pulmonary disease, unspecified: Secondary | ICD-10-CM | POA: Insufficient documentation

## 2018-04-22 DIAGNOSIS — R0981 Nasal congestion: Secondary | ICD-10-CM | POA: Insufficient documentation

## 2018-04-22 DIAGNOSIS — Z5321 Procedure and treatment not carried out due to patient leaving prior to being seen by health care provider: Secondary | ICD-10-CM | POA: Insufficient documentation

## 2018-04-22 DIAGNOSIS — R05 Cough: Secondary | ICD-10-CM | POA: Insufficient documentation

## 2018-04-22 NOTE — ED Triage Notes (Addendum)
Pt presents to ED with cough, congestion, and fatigue. Pt states this has been ongoing for the past 6 weeks. Pt has been seen in this ED for the same. Fever earlier today but unsure of how high it was because she does not have a thermometer but states she knew it was a fever because she took tylenol and felt better afterwards. Pt currently has no increased work of breathing or acute distress noted. Pt was told during previous visits this illness was her COPD but pt states "this is not my COPD. I have had that for years and it does not feel like this."

## 2018-04-22 NOTE — ED Notes (Signed)
Pt called in lobby without response.

## 2018-04-22 NOTE — ED Notes (Signed)
Pt called in lobby, no answer.

## 2018-04-22 NOTE — ED Notes (Signed)
Pt called without answer.  

## 2018-05-13 ENCOUNTER — Other Ambulatory Visit: Payer: Self-pay

## 2018-05-13 ENCOUNTER — Emergency Department
Admission: EM | Admit: 2018-05-13 | Discharge: 2018-05-13 | Disposition: A | Discharge status: Home / Self Care | Payer: Self-pay | Attending: Emergency Medicine | Admitting: Emergency Medicine

## 2018-05-13 ENCOUNTER — Emergency Department: Payer: Self-pay

## 2018-05-13 ENCOUNTER — Emergency Department
Admission: EM | Admit: 2018-05-13 | Discharge: 2018-05-13 | Discharge status: Absent without leave | Payer: Self-pay | Attending: Emergency Medicine | Admitting: Emergency Medicine

## 2018-05-13 DIAGNOSIS — F17210 Nicotine dependence, cigarettes, uncomplicated: Secondary | ICD-10-CM | POA: Insufficient documentation

## 2018-05-13 DIAGNOSIS — R112 Nausea with vomiting, unspecified: Secondary | ICD-10-CM | POA: Insufficient documentation

## 2018-05-13 DIAGNOSIS — R197 Diarrhea, unspecified: Secondary | ICD-10-CM | POA: Insufficient documentation

## 2018-05-13 DIAGNOSIS — J441 Chronic obstructive pulmonary disease with (acute) exacerbation: Secondary | ICD-10-CM | POA: Insufficient documentation

## 2018-05-13 DIAGNOSIS — Z79899 Other long term (current) drug therapy: Secondary | ICD-10-CM | POA: Insufficient documentation

## 2018-05-13 DIAGNOSIS — R55 Syncope and collapse: Secondary | ICD-10-CM | POA: Insufficient documentation

## 2018-05-13 LAB — BRAIN NATRIURETIC PEPTIDE: B NATRIURETIC PEPTIDE 5: 11 pg/mL (ref 0.0–100.0)

## 2018-05-13 LAB — URINALYSIS, COMPLETE (UACMP) WITH MICROSCOPIC
Bacteria, UA: NONE SEEN
Bilirubin Urine: NEGATIVE
Glucose, UA: NEGATIVE mg/dL
Ketones, ur: NEGATIVE mg/dL
Leukocytes, UA: NEGATIVE
Nitrite: NEGATIVE
Protein, ur: NEGATIVE mg/dL
SPECIFIC GRAVITY, URINE: 1.016 (ref 1.005–1.030)
pH: 7 (ref 5.0–8.0)

## 2018-05-13 LAB — COMPREHENSIVE METABOLIC PANEL
ALT: 12 U/L (ref 0–44)
AST: 14 U/L — AB (ref 15–41)
Albumin: 4.2 g/dL (ref 3.5–5.0)
Alkaline Phosphatase: 57 U/L (ref 38–126)
Anion gap: 10 (ref 5–15)
BUN: 14 mg/dL (ref 6–20)
CO2: 28 mmol/L (ref 22–32)
CREATININE: 0.62 mg/dL (ref 0.44–1.00)
Calcium: 9.2 mg/dL (ref 8.9–10.3)
Chloride: 104 mmol/L (ref 98–111)
GFR calc Af Amer: >60 mL/min (ref >60)
Glucose, Bld: 118 mg/dL — ABNORMAL HIGH (ref 70–99)
Potassium: 4.1 mmol/L (ref 3.5–5.1)
Sodium: 142 mmol/L (ref 135–145)
TOTAL PROTEIN: 7.9 g/dL (ref 6.5–8.1)
Total Bilirubin: 0.6 mg/dL (ref 0.3–1.2)

## 2018-05-13 LAB — CBC WITH DIFFERENTIAL/PLATELET
ABS IMMATURE GRANULOCYTES: 0.15 10*3/uL — AB (ref 0.00–0.07)
Basophils Absolute: 0.1 10*3/uL (ref 0.0–0.1)
Basophils Relative: 1 %
EOS ABS: 0.1 10*3/uL (ref 0.0–0.5)
Eosinophils Relative: 1 %
HEMATOCRIT: 44.1 % (ref 36.0–46.0)
Hemoglobin: 15.1 g/dL — ABNORMAL HIGH (ref 12.0–15.0)
IMMATURE GRANULOCYTES: 1 %
LYMPHS ABS: 2.9 10*3/uL (ref 0.7–4.0)
Lymphocytes Relative: 23 %
MCH: 32.3 pg (ref 26.0–34.0)
MCHC: 34.2 g/dL (ref 30.0–36.0)
MCV: 94.2 fL (ref 80.0–100.0)
MONO ABS: 0.8 10*3/uL (ref 0.1–1.0)
Monocytes Relative: 6 %
NEUTROS PCT: 68 %
Neutro Abs: 8.3 10*3/uL — ABNORMAL HIGH (ref 1.7–7.7)
Platelets: 318 10*3/uL (ref 150–400)
RBC: 4.68 MIL/uL (ref 3.87–5.11)
RDW: 12.4 % (ref 11.5–15.5)
WBC: 12.3 10*3/uL — ABNORMAL HIGH (ref 4.0–10.5)
nRBC: 0 % (ref 0.0–0.2)

## 2018-05-13 LAB — LIPASE, BLOOD: LIPASE: 27 U/L (ref 11–51)

## 2018-05-13 LAB — POCT PREGNANCY, URINE: Preg Test, Ur: NEGATIVE

## 2018-05-13 LAB — TROPONIN I

## 2018-05-13 MED ORDER — ONDANSETRON HCL 4 MG PO TABS: 4.0000 mg | ORAL_TABLET | Freq: Every day | ORAL | 0 refills | Status: DC | PRN

## 2018-05-13 MED ORDER — METHYLPREDNISOLONE SODIUM SUCC 125 MG IJ SOLR
125.0000 mg | Freq: Once | INTRAMUSCULAR | Status: AC
  Administered 2018-05-13: 125 mg via INTRAVENOUS
  Filled 2018-05-13: qty 2

## 2018-05-13 MED ORDER — ONDANSETRON HCL 4 MG/2ML IJ SOLN
4.0000 mg | Freq: Once | INTRAMUSCULAR | Status: AC
  Administered 2018-05-13: 4 mg via INTRAVENOUS
  Filled 2018-05-13: qty 2

## 2018-05-13 MED ORDER — IBUPROFEN 400 MG PO TABS
400.0000 mg | ORAL_TABLET | Freq: Once | ORAL | Status: AC
  Administered 2018-05-13: 400 mg via ORAL
  Filled 2018-05-13: qty 1

## 2018-05-13 MED ORDER — IPRATROPIUM-ALBUTEROL 0.5-2.5 (3) MG/3ML IN SOLN
9.0000 mL | Freq: Once | RESPIRATORY_TRACT | Status: AC
  Administered 2018-05-13: 9 mL via RESPIRATORY_TRACT
  Filled 2018-05-13: qty 3

## 2018-05-13 MED ORDER — PREDNISONE 50 MG PO TABS: ORAL_TABLET | ORAL | 0 refills | Status: DC

## 2018-05-13 MED ORDER — SODIUM CHLORIDE 0.9 % IV BOLUS
1000.0000 mL | Freq: Once | INTRAVENOUS | Status: AC
  Administered 2018-05-13: 1000 mL via INTRAVENOUS

## 2018-05-13 NOTE — ED Triage Notes (Signed)
Pt arrives from home via [facility] c/o N/V/D subjective syncopal episode during emesis approx 1 hour previously. Pt hx COPD, CHF, asthma, Duoneb PTA.

## 2018-05-13 NOTE — ED Provider Notes (Signed)
[facility] Emergency Department Provider Note  ___________________________________________   First MD Initiated Contact with Patient 05/13/18 [phone]     (approximate)  I have reviewed the triage vital signs and the nursing notes.   HISTORY  Chief Complaint Shortness of Breath   HPI Jennifer Vasquez is a 32 y.o. female with a history of bipolar disorder, CHF as well as COPD who was presented to the emergency department with 1 month of difficulty breathing.  Says that she has been through multiple courses of antibiotics, just finishing doxycycline yesterday.  Last steroid dose was 4 days ago.  Says that just this morning she started having vomiting which is nonbloody.  Not complaining of any abdominal pain.  Also says that she has been having multiple episodes of diarrhea per day which is nonbloody.  Says that this started 3 days ago.  No known sick contacts.  Says that she has not smoked any cigarettes in 2 weeks.  Patient said that she also passed out this morning while vomiting and is concerned that she may have aspirated.  Past Medical History:  Diagnosis Date  . Acute carpal tunnel syndrome 10/10/2014  . Anxiety state 09/03/2009  . Asthma   . Bipolar disorder (HCC) 02/09/2013  . CHF (congestive heart failure) (HCC)   . COPD (chronic obstructive pulmonary disease) (HCC)   . DDD (degenerative disc disease), cervical 11/13/2014  . DDD (degenerative disc disease), lumbar 11/13/2014  . Hepatitis C   . Lumbosacral radiculopathy 11/13/2014  . Tobacco use disorder 09/06/2012    Patient Active Problem List   Diagnosis Date Noted  . Community acquired pneumonia of left lung   . Respiratory failure (HCC) 11/05/2016  . DDD (degenerative disc disease), lumbar 11/13/2014  . DDD (degenerative disc disease), cervical 11/13/2014  . Lumbosacral radiculopathy 11/13/2014  . Acute carpal tunnel syndrome 10/10/2014  . Chronic pain 03/02/2013  . Depression 03/02/2013  . Bipolar  disorder (HCC) 02/09/2013  . Hepatitis C 12/18/2012  . Asthma 11/23/2012  . Neck pain 11/23/2012  . Torticollis 11/23/2012  . Tobacco use disorder 09/06/2012  . Anxiety state 09/03/2009    Past Surgical History:  Procedure Laterality Date  . CERVICAL DISC ARTHROPLASTY    . TONSILLECTOMY    . TUBAL LIGATION      Prior to Admission medications   Medication Sig Start Date End Date Taking? Authorizing Provider  albuterol (PROVENTIL HFA;VENTOLIN HFA) 108 (90 Base) MCG/ACT inhaler Inhale 2 puffs into the lungs every 6 (six) hours as needed for wheezing or shortness of breath. 01/17/18  Yes Merrily Brittle, MD  cyclobenzaprine (FLEXERIL) 10 MG tablet 3 (three) times daily.  06/04/16  Yes [provider]  ibuprofen (ADVIL,MOTRIN) 800 MG tablet Take 800 mg by mouth every 6 (six) hours as needed for fever or mild pain.    Yes [provider]  ipratropium (ATROVENT) 0.02 % nebulizer solution Take 2.5 mLs (0.5 mg total) by nebulization 4 (four) times daily. 10/29/17  Yes Triplett, Cari B, FNP  loratadine (CLARITIN) 10 MG tablet Take 10 mg by mouth daily.   Yes [provider]  tiotropium (SPIRIVA) 18 MCG inhalation capsule Place 18 mcg into inhaler and inhale daily.   Yes [provider]  albuterol-ipratropium (COMBIVENT) 18-103 MCG/ACT inhaler Inhale 1 puff into the lungs 4 (four) times daily as needed.     [provider]  azithromycin (ZITHROMAX Z-PAK) 250 MG tablet Take 2 tablets (500 mg) on  Day 1,  followed by  1 tablet (250 mg) once daily on Days 2 through 5. Patient not taking: Reported on 05/13/2018 01/17/18   Merrily Brittle, MD  benzonatate (TESSALON PERLES) 100 MG capsule Take 1 capsule (100 mg total) by mouth every 6 (six) hours as needed for cough. Patient not taking: Reported on 05/13/2018 01/17/18 01/17/19  Merrily Brittle, MD  chlorpheniramine-HYDROcodone [facility] PENNKINETIC ER) 10-8 MG/5ML SUER Take 5 mLs by mouth 2 (two) times daily. Patient  not taking: Reported on 05/13/2018 03/21/17   Emily Filbert, MD  citalopram (CELEXA) 20 MG tablet Take 1 tablet by mouth daily. 11/25/16   [provider]  fluticasone (FLONASE) 50 MCG/ACT nasal spray 2 sprays daily as needed.  06/11/16   [provider]  Fluticasone-Salmeterol (ADVAIR DISKUS) 250-50 MCG/DOSE AEPB 1 inhalation 2 (two) times daily. 04/01/14   [provider]  guaiFENesin-codeine 100-10 MG/5ML syrup Take 10 mLs by mouth 3 (three) times daily as needed. Patient not taking: Reported on 05/13/2018 10/29/17   Kem Boroughs B, FNP  levothyroxine (SYNTHROID, LEVOTHROID) 25 MCG tablet Take 25 mcg by mouth daily before breakfast.    [provider]  nystatin (MYCOSTATIN) 100000 UNIT/ML suspension Use as directed 5 mLs (500,000 Units total) in the mouth or throat 4 (four) times daily. Patient not taking: Reported on 03/21/2017 11/15/16   Milagros Loll, MD  ondansetron (ZOFRAN ODT) 4 MG disintegrating tablet Take 1 tablet (4 mg total) by mouth every 8 (eight) hours as needed for nausea or vomiting. Patient not taking: Reported on 05/13/2018 01/13/17   Midge Minium, MD  potassium chloride (K-DUR) 10 MEQ tablet Take 1 tablet (10 mEq total) by mouth daily. Patient not taking: Reported on 03/21/2017 11/15/16   Milagros Loll, MD  traZODone (DESYREL) 50 MG tablet Take 50 mg by mouth at bedtime as needed for sleep.    [provider]    Allergies Acetaminophen; Amoxicillin-pot clavulanate; Gabapentin; Tetanus toxoids; Tramadol; and Shellfish allergy  Family History  Problem Relation Age of Onset  . Arthritis Mother   . Asthma Mother   . Diabetes Mother   . Hyperlipidemia Mother   . Vision loss Mother   . Alcohol abuse Father   . Asthma Father   . Diabetes Father   . Heart disease Father   . Depression Father   . COPD Father   . Cancer Father   . Hypertension Father   . Kidney disease Father   . Vision loss Father     Social History Social  History   Tobacco Use  . Smoking status: Current Some Day Smoker    Packs/day: 0.50    Types: Cigarettes  . Smokeless tobacco: Never Used  Substance Use Topics  . Alcohol use: Yes    Alcohol/week: 0.0 standard drinks  . Drug use: No    Review of Systems  Constitutional: No fever/chills Eyes: No visual changes. ENT: No sore throat. Cardiovascular: Denies chest pain. Respiratory: As above  gastrointestinal: No abdominal pain.   No constipation. Genitourinary: Negative for dysuria. Musculoskeletal: Negative for back pain. Skin: Negative for rash. Neurological: Negative for headaches, focal weakness or numbness.   ____________________________________________   PHYSICAL EXAM:  VITAL SIGNS: ED Triage Vitals [05/13/18 0831]  Enc Vitals Group     BP      Pulse      Resp      Temp 98.2 F (36.8 C)     Temp Source Oral     SpO2      Weight 190 lb  0.6 oz (86.2 kg)     Height 5\' 3"  (1.6 m)     Head Circumference      Peak Flow      Pain Score 0     Pain Loc      Pain Edu?      Excl. in GC?     Constitutional: Alert and oriented. in no acute distress. Eyes: Conjunctivae are normal.  Head: Atraumatic. Nose: No congestion/rhinnorhea. Mouth/Throat: Mucous membranes are moist.  Neck: No stridor.   Cardiovascular: Normal rate, regular rhythm. Grossly normal heart sounds.  Good peripheral circulation. Respiratory: Normal respiratory effort.  No retractions.  Wheezing throughout with a prolonged expiratory phase.  Speaks in full sentences.  No respiratory distress. Gastrointestinal: Soft and nontender. No distention.  Musculoskeletal: No lower extremity tenderness nor edema.  No joint effusions. Neurologic:  Normal speech and language. No gross focal neurologic deficits are appreciated. Skin:  Skin is warm, dry and intact. No rash noted. Psychiatric: Mood and affect are normal. Speech and behavior are normal.  ____________________________________________   LABS (all  labs ordered are listed, but only abnormal results are displayed)  Labs Reviewed  CBC WITH DIFFERENTIAL/PLATELET - Abnormal; Notable for the following components:      Result Value   WBC 12.3 (*)    Hemoglobin 15.1 (*)    Neutro Abs 8.3 (*)    Abs Immature Granulocytes 0.15 (*)    All other components within normal limits  COMPREHENSIVE METABOLIC PANEL - Abnormal; Notable for the following components:   Glucose, Bld 118 (*)    AST 14 (*)    All other components within normal limits  URINALYSIS, COMPLETE (UACMP) WITH MICROSCOPIC - Abnormal; Notable for the following components:   Color, Urine YELLOW (*)    APPearance HAZY (*)    Hgb urine dipstick SMALL (*)    All other components within normal limits  GASTROINTESTINAL PANEL BY PCR, STOOL (REPLACES STOOL CULTURE)  C DIFFICILE QUICK SCREEN W PCR REFLEX  LIPASE, BLOOD  TROPONIN I  BRAIN NATRIURETIC PEPTIDE  POC URINE PREG, ED  POCT PREGNANCY, URINE   ____________________________________________  EKG  ED ECG REPORT I, , the attending physician, personally viewed and interpreted this ECG.   Date: 05/13/2018  EKG Time: 0834  Rate: 90  Rhythm: normal sinus rhythm  Axis: Normal  Intervals:none  ST&T Change: No ST segment elevation or depression.  No abnormal T wave inversion.  ____________________________________________  RADIOLOGY  Chest x-ray without acute process. ____________________________________________   PROCEDURES  Procedure(s) performed:   Procedures  Critical Care performed:   ____________________________________________   INITIAL IMPRESSION / ASSESSMENT AND PLAN / ED COURSE  Pertinent labs & imaging results that were available during my care of the patient were reviewed by me and considered in my medical decision making (see chart for details).  Differential diagnosis includes, but is not limited to, biliary disease (biliary colic, acute cholecystitis, cholangitis,  choledocholithiasis, etc), intrathoracic causes for epigastric abdominal pain including ACS, gastritis, duodenitis, pancreatitis, small bowel or large bowel obstruction, abdominal aortic aneurysm, hernia, and ulcer(s). Differential includes, but is not limited to, viral syndrome, bronchitis including COPD exacerbation, pneumonia, reactive airway disease including asthma, CHF including exacerbation with or without pulmonary/interstitial edema, pneumothorax, ACS, thoracic trauma, and pulmonary embolism. As part of my medical decision making, I reviewed the following data within the electronic MEDICAL RECORD NUMBER Notes from prior ED visits  ----------------------------------------- 12:07 PM on 05/13/2018 -----------------------------------------  Patient able to tolerate p.o. fluids  and has not vomited.  Also has not had any diarrhea.  Still without any abdominal pain.  Reexamined her lungs and she does have scant diffuse wheezing.  Says that she is back to her baseline breathing status.  Patient will be discharged with prednisone.  Says that she has albuterol inhaler at home.  Will also be discharged with Zofran.  I will not put her back on antibiotics as her chest x-ray appears clear and she had multiple course of antibiotics already this month.  We also discussed following up with her primary care Dr. Phineas Real for possible changes in her COPD maintenance medication.  Patient knows to return for any worsening concerning symptoms especially worsening belly pain, nausea or vomiting.  Elevated white blood cell count likely related to recent steroid use.  Abdomen is nontender.  I do not believe that there is an inflammatory process ongoing right now such as an acute appendicitis or cholecystitis.  Because the patient has not had any diarrhea here in the emergency department it is unlikely that she is having a pathologic diuresis for C. difficile.  Likely viral syndrome causing the nausea vomiting  diarrhea. ____________________________________________   FINAL CLINICAL IMPRESSION(S) / ED DIAGNOSES  COPD exacerbation.  Nausea vomiting diarrhea.  NEW MEDICATIONS STARTED DURING THIS VISIT:  New Prescriptions   No medications on file     Note:  This document was prepared using Dragon voice recognition software and may include unintentional dictation errors.     Myrna Blazer, MD 05/13/18 [phone]

## 2018-05-13 NOTE — ED Notes (Signed)
Pt unable to produce stool. Urine sent to lab per orders.

## 2018-05-13 NOTE — ED Notes (Signed)
Pt ambulating to restroom to attempt to produce urine/stool sample.

## 2018-07-05 ENCOUNTER — Inpatient Hospital Stay
Admission: EM | Admit: 2018-07-05 | Discharge: 2018-07-07 | DRG: 193 | Discharge status: Against Medical Advice | Payer: Self-pay | Attending: Internal Medicine | Admitting: Internal Medicine

## 2018-07-05 ENCOUNTER — Other Ambulatory Visit: Payer: Self-pay

## 2018-07-05 ENCOUNTER — Inpatient Hospital Stay: Payer: Self-pay

## 2018-07-05 ENCOUNTER — Encounter: Payer: Self-pay | Admitting: Internal Medicine

## 2018-07-05 ENCOUNTER — Emergency Department: Payer: Self-pay

## 2018-07-05 DIAGNOSIS — B1920 Unspecified viral hepatitis C without hepatic coma: Secondary | ICD-10-CM | POA: Diagnosis present

## 2018-07-05 DIAGNOSIS — Z7952 Long term (current) use of systemic steroids: Secondary | ICD-10-CM

## 2018-07-05 DIAGNOSIS — J9601 Acute respiratory failure with hypoxia: Secondary | ICD-10-CM | POA: Diagnosis present

## 2018-07-05 DIAGNOSIS — Z79890 Hormone replacement therapy (postmenopausal): Secondary | ICD-10-CM

## 2018-07-05 DIAGNOSIS — Z6830 Body mass index (BMI) 30.0-30.9, adult: Secondary | ICD-10-CM

## 2018-07-05 DIAGNOSIS — R0902 Hypoxemia: Secondary | ICD-10-CM

## 2018-07-05 DIAGNOSIS — Z8701 Personal history of pneumonia (recurrent): Secondary | ICD-10-CM

## 2018-07-05 DIAGNOSIS — Z833 Family history of diabetes mellitus: Secondary | ICD-10-CM

## 2018-07-05 DIAGNOSIS — J189 Pneumonia, unspecified organism: Secondary | ICD-10-CM | POA: Diagnosis present

## 2018-07-05 DIAGNOSIS — E872 Acidosis: Secondary | ICD-10-CM | POA: Diagnosis present

## 2018-07-05 DIAGNOSIS — Z8619 Personal history of other infectious and parasitic diseases: Secondary | ICD-10-CM

## 2018-07-05 DIAGNOSIS — Z79899 Other long term (current) drug therapy: Secondary | ICD-10-CM

## 2018-07-05 DIAGNOSIS — J449 Chronic obstructive pulmonary disease, unspecified: Secondary | ICD-10-CM

## 2018-07-05 DIAGNOSIS — Z841 Family history of disorders of kidney and ureter: Secondary | ICD-10-CM

## 2018-07-05 DIAGNOSIS — R918 Other nonspecific abnormal finding of lung field: Secondary | ICD-10-CM

## 2018-07-05 DIAGNOSIS — A419 Sepsis, unspecified organism: Secondary | ICD-10-CM | POA: Diagnosis present

## 2018-07-05 DIAGNOSIS — R05 Cough: Secondary | ICD-10-CM

## 2018-07-05 DIAGNOSIS — Z8349 Family history of other endocrine, nutritional and metabolic diseases: Secondary | ICD-10-CM

## 2018-07-05 DIAGNOSIS — G4700 Insomnia, unspecified: Secondary | ICD-10-CM | POA: Diagnosis present

## 2018-07-05 DIAGNOSIS — F1410 Cocaine abuse, uncomplicated: Secondary | ICD-10-CM | POA: Diagnosis present

## 2018-07-05 DIAGNOSIS — Z888 Allergy status to other drugs, medicaments and biological substances status: Secondary | ICD-10-CM

## 2018-07-05 DIAGNOSIS — J441 Chronic obstructive pulmonary disease with (acute) exacerbation: Secondary | ICD-10-CM | POA: Diagnosis present

## 2018-07-05 DIAGNOSIS — Z885 Allergy status to narcotic agent status: Secondary | ICD-10-CM

## 2018-07-05 DIAGNOSIS — Z825 Family history of asthma and other chronic lower respiratory diseases: Secondary | ICD-10-CM

## 2018-07-05 DIAGNOSIS — Z881 Allergy status to other antibiotic agents status: Secondary | ICD-10-CM

## 2018-07-05 DIAGNOSIS — Z8261 Family history of arthritis: Secondary | ICD-10-CM

## 2018-07-05 DIAGNOSIS — Z886 Allergy status to analgesic agent status: Secondary | ICD-10-CM

## 2018-07-05 DIAGNOSIS — Z91013 Allergy to seafood: Secondary | ICD-10-CM

## 2018-07-05 DIAGNOSIS — Z8249 Family history of ischemic heart disease and other diseases of the circulatory system: Secondary | ICD-10-CM

## 2018-07-05 DIAGNOSIS — Z791 Long term (current) use of non-steroidal anti-inflammatories (NSAID): Secondary | ICD-10-CM

## 2018-07-05 DIAGNOSIS — Z818 Family history of other mental and behavioral disorders: Secondary | ICD-10-CM

## 2018-07-05 DIAGNOSIS — J181 Lobar pneumonia, unspecified organism: Principal | ICD-10-CM | POA: Diagnosis present

## 2018-07-05 DIAGNOSIS — F17210 Nicotine dependence, cigarettes, uncomplicated: Secondary | ICD-10-CM | POA: Diagnosis present

## 2018-07-05 DIAGNOSIS — F411 Generalized anxiety disorder: Secondary | ICD-10-CM | POA: Diagnosis present

## 2018-07-05 DIAGNOSIS — F1490 Cocaine use, unspecified, uncomplicated: Secondary | ICD-10-CM

## 2018-07-05 DIAGNOSIS — J440 Chronic obstructive pulmonary disease with acute lower respiratory infection: Secondary | ICD-10-CM | POA: Diagnosis present

## 2018-07-05 DIAGNOSIS — D891 Cryoglobulinemia: Secondary | ICD-10-CM | POA: Diagnosis present

## 2018-07-05 DIAGNOSIS — Z880 Allergy status to penicillin: Secondary | ICD-10-CM

## 2018-07-05 DIAGNOSIS — Z79891 Long term (current) use of opiate analgesic: Secondary | ICD-10-CM

## 2018-07-05 DIAGNOSIS — Z887 Allergy status to serum and vaccine status: Secondary | ICD-10-CM

## 2018-07-05 DIAGNOSIS — Z821 Family history of blindness and visual loss: Secondary | ICD-10-CM

## 2018-07-05 DIAGNOSIS — Z811 Family history of alcohol abuse and dependence: Secondary | ICD-10-CM

## 2018-07-05 DIAGNOSIS — F319 Bipolar disorder, unspecified: Secondary | ICD-10-CM | POA: Diagnosis present

## 2018-07-05 DIAGNOSIS — E669 Obesity, unspecified: Secondary | ICD-10-CM | POA: Diagnosis present

## 2018-07-05 DIAGNOSIS — Z7951 Long term (current) use of inhaled steroids: Secondary | ICD-10-CM

## 2018-07-05 LAB — COMPREHENSIVE METABOLIC PANEL
ALT: 17 U/L (ref 0–44)
AST: 18 U/L (ref 15–41)
Albumin: 4 g/dL (ref 3.5–5.0)
Alkaline Phosphatase: 61 U/L (ref 38–126)
Anion gap: 10 (ref 5–15)
BUN: 9 mg/dL (ref 6–20)
CO2: 28 mmol/L (ref 22–32)
Calcium: 8.8 mg/dL — ABNORMAL LOW (ref 8.9–10.3)
Chloride: 104 mmol/L (ref 98–111)
Creatinine, Ser: 0.71 mg/dL (ref 0.44–1.00)
GFR calc Af Amer: >60 mL/min (ref >60)
GFR calc non Af Amer: >60 mL/min (ref >60)
Glucose, Bld: 134 mg/dL — ABNORMAL HIGH (ref 70–99)
Potassium: 4.1 mmol/L (ref 3.5–5.1)
Sodium: 142 mmol/L (ref 135–145)
Total Bilirubin: 0.4 mg/dL (ref 0.3–1.2)
Total Protein: 7.7 g/dL (ref 6.5–8.1)

## 2018-07-05 LAB — URINALYSIS, COMPLETE (UACMP) WITH MICROSCOPIC
Bacteria, UA: NONE SEEN
Bilirubin Urine: NEGATIVE
Glucose, UA: NEGATIVE mg/dL
Ketones, ur: NEGATIVE mg/dL
Nitrite: NEGATIVE
PH: 7 (ref 5.0–8.0)
Protein, ur: 30 mg/dL — AB
Specific Gravity, Urine: 1.005 (ref 1.005–1.030)

## 2018-07-05 LAB — URINE DRUG SCREEN, QUALITATIVE (ARMC ONLY)
Amphetamines, Ur Screen: NOT DETECTED
Barbiturates, Ur Screen: NOT DETECTED
Benzodiazepine, Ur Scrn: NOT DETECTED
COCAINE METABOLITE, UR ~~LOC~~: POSITIVE — AB
Cannabinoid 50 Ng, Ur ~~LOC~~: NOT DETECTED
MDMA (Ecstasy)Ur Screen: NOT DETECTED
Methadone Scn, Ur: POSITIVE — AB
Opiate, Ur Screen: POSITIVE — AB
Phencyclidine (PCP) Ur S: NOT DETECTED
Tricyclic, Ur Screen: POSITIVE — AB

## 2018-07-05 LAB — LIPASE, BLOOD: Lipase: 27 U/L (ref 11–51)

## 2018-07-05 LAB — INFLUENZA PANEL BY PCR (TYPE A & B)
Influenza A By PCR: NEGATIVE
Influenza B By PCR: NEGATIVE

## 2018-07-05 LAB — MAGNESIUM: Magnesium: 2.2 mg/dL (ref 1.7–2.4)

## 2018-07-05 LAB — LACTATE DEHYDROGENASE: LDH: 225 U/L — ABNORMAL HIGH (ref 98–192)

## 2018-07-05 LAB — TROPONIN I: Troponin I: <0.03 ng/mL (ref <0.03)

## 2018-07-05 LAB — CBC WITH DIFFERENTIAL/PLATELET
Abs Immature Granulocytes: 0.27 10*3/uL — ABNORMAL HIGH (ref 0.00–0.07)
Basophils Absolute: 0 10*3/uL (ref 0.0–0.1)
Basophils Relative: 0 %
EOS ABS: 0 10*3/uL (ref 0.0–0.5)
Eosinophils Relative: 0 %
HCT: 37.9 % (ref 36.0–46.0)
Hemoglobin: 12.6 g/dL (ref 12.0–15.0)
Immature Granulocytes: 2 %
LYMPHS ABS: 1.2 10*3/uL (ref 0.7–4.0)
Lymphocytes Relative: 7 %
MCH: 31.4 pg (ref 26.0–34.0)
MCHC: 33.2 g/dL (ref 30.0–36.0)
MCV: 94.5 fL (ref 80.0–100.0)
Monocytes Absolute: 0.6 10*3/uL (ref 0.1–1.0)
Monocytes Relative: 3 %
Neutro Abs: 15.3 10*3/uL — ABNORMAL HIGH (ref 1.7–7.7)
Neutrophils Relative %: 88 %
Platelets: 290 10*3/uL (ref 150–400)
RBC: 4.01 MIL/uL (ref 3.87–5.11)
RDW: 13.1 % (ref 11.5–15.5)
WBC: 17.4 10*3/uL — ABNORMAL HIGH (ref 4.0–10.5)
nRBC: 0 % (ref 0.0–0.2)

## 2018-07-05 LAB — PROTIME-INR
INR: 1.02
Prothrombin Time: 13.3 seconds (ref 11.4–15.2)

## 2018-07-05 LAB — PHOSPHORUS: Phosphorus: 4.4 mg/dL (ref 2.5–4.6)

## 2018-07-05 LAB — LACTIC ACID, PLASMA
LACTIC ACID, VENOUS: 2.1 mmol/L — AB (ref 0.5–1.9)
Lactic Acid, Venous: 2.5 mmol/L (ref 0.5–1.9)

## 2018-07-05 LAB — BRAIN NATRIURETIC PEPTIDE: B Natriuretic Peptide: 479 pg/mL — ABNORMAL HIGH (ref 0.0–100.0)

## 2018-07-05 LAB — PROCALCITONIN: Procalcitonin: 0.88 ng/mL

## 2018-07-05 LAB — GROUP A STREP BY PCR: Group A Strep by PCR: NOT DETECTED

## 2018-07-05 LAB — STREP PNEUMONIAE URINARY ANTIGEN: Strep Pneumo Urinary Antigen: NEGATIVE

## 2018-07-05 LAB — MRSA PCR SCREENING: MRSA by PCR: NEGATIVE

## 2018-07-05 MED ORDER — MORPHINE SULFATE (PF) 4 MG/ML IV SOLN: 4.0000 mg | INTRAVENOUS | Status: DC | PRN

## 2018-07-05 MED ORDER — METHYLPREDNISOLONE SODIUM SUCC 125 MG IJ SOLR
60.0000 mg | Freq: Once | INTRAMUSCULAR | Status: AC
  Administered 2018-07-05: 60 mg via INTRAVENOUS
  Filled 2018-07-05: qty 2

## 2018-07-05 MED ORDER — LEVOFLOXACIN IN D5W 750 MG/150ML IV SOLN: 750.0000 mg | INTRAVENOUS | Status: DC

## 2018-07-05 MED ORDER — BISACODYL 5 MG PO TBEC: 5.0000 mg | DELAYED_RELEASE_TABLET | Freq: Every day | ORAL | Status: DC | PRN

## 2018-07-05 MED ORDER — MOMETASONE FURO-FORMOTEROL FUM 200-5 MCG/ACT IN AERO
2.0000 | INHALATION_SPRAY | Freq: Two times a day (BID) | RESPIRATORY_TRACT | Status: DC
  Administered 2018-07-06 – 2018-07-07 (×4): 2 via RESPIRATORY_TRACT
  Filled 2018-07-05: qty 8.8

## 2018-07-05 MED ORDER — SODIUM CHLORIDE 0.9 % IV BOLUS (SEPSIS)
1000.0000 mL | Freq: Once | INTRAVENOUS | Status: AC
  Administered 2018-07-05: 1000 mL via INTRAVENOUS

## 2018-07-05 MED ORDER — ONDANSETRON HCL 4 MG PO TABS: 4.0000 mg | ORAL_TABLET | Freq: Four times a day (QID) | ORAL | Status: DC | PRN

## 2018-07-05 MED ORDER — ONDANSETRON HCL 4 MG/2ML IJ SOLN: 4.0000 mg | Freq: Four times a day (QID) | INTRAMUSCULAR | Status: DC | PRN

## 2018-07-05 MED ORDER — METHYLPREDNISOLONE SODIUM SUCC 125 MG IJ SOLR
125.0000 mg | Freq: Once | INTRAMUSCULAR | Status: AC
  Administered 2018-07-05: 125 mg via INTRAVENOUS
  Filled 2018-07-05: qty 2

## 2018-07-05 MED ORDER — INFLUENZA VAC SPLIT QUAD 0.5 ML IM SUSY: 0.5000 mL | PREFILLED_SYRINGE | INTRAMUSCULAR | Status: DC

## 2018-07-05 MED ORDER — CITALOPRAM HYDROBROMIDE 20 MG PO TABS
20.0000 mg | ORAL_TABLET | Freq: Every day | ORAL | Status: DC
  Administered 2018-07-06 – 2018-07-07 (×2): 20 mg via ORAL
  Filled 2018-07-05 (×3): qty 1

## 2018-07-05 MED ORDER — IPRATROPIUM-ALBUTEROL 0.5-2.5 (3) MG/3ML IN SOLN: 3.0000 mL | RESPIRATORY_TRACT | Status: DC | PRN

## 2018-07-05 MED ORDER — VANCOMYCIN HCL IN DEXTROSE 1-5 GM/200ML-% IV SOLN
1000.0000 mg | Freq: Once | INTRAVENOUS | Status: AC
  Administered 2018-07-05: 1000 mg via INTRAVENOUS
  Filled 2018-07-05: qty 200

## 2018-07-05 MED ORDER — VANCOMYCIN HCL 10 G IV SOLR
1250.0000 mg | Freq: Two times a day (BID) | INTRAVENOUS | Status: DC
  Filled 2018-07-05 (×2): qty 1250

## 2018-07-05 MED ORDER — SENNOSIDES-DOCUSATE SODIUM 8.6-50 MG PO TABS: 1.0000 | ORAL_TABLET | Freq: Every evening | ORAL | Status: DC | PRN

## 2018-07-05 MED ORDER — IPRATROPIUM-ALBUTEROL 0.5-2.5 (3) MG/3ML IN SOLN
3.0000 mL | RESPIRATORY_TRACT | Status: AC
  Administered 2018-07-05 – 2018-07-06 (×6): 3 mL via RESPIRATORY_TRACT
  Filled 2018-07-05 (×6): qty 3

## 2018-07-05 MED ORDER — SODIUM CHLORIDE 0.9 % IV SOLN
1.0000 g | INTRAVENOUS | Status: DC
  Filled 2018-07-05: qty 10

## 2018-07-05 MED ORDER — IOHEXOL 300 MG/ML  SOLN
75.0000 mL | Freq: Once | INTRAMUSCULAR | Status: AC | PRN
  Administered 2018-07-05: 75 mL via INTRAVENOUS

## 2018-07-05 MED ORDER — MORPHINE SULFATE (PF) 2 MG/ML IV SOLN: 2.0000 mg | INTRAVENOUS | Status: DC | PRN

## 2018-07-05 MED ORDER — SODIUM CHLORIDE 0.9 % IV SOLN
2.0000 g | INTRAVENOUS | Status: DC
  Administered 2018-07-05 – 2018-07-07 (×3): 2 g via INTRAVENOUS
  Filled 2018-07-05 (×3): qty 2

## 2018-07-05 MED ORDER — SODIUM CHLORIDE 0.9 % IV SOLN
500.0000 mg | INTRAVENOUS | Status: DC
  Administered 2018-07-06 – 2018-07-07 (×2): 500 mg via INTRAVENOUS
  Filled 2018-07-05 (×2): qty 500

## 2018-07-05 MED ORDER — SODIUM CHLORIDE 0.9 % IV SOLN: 500.0000 mg | INTRAVENOUS | Status: DC

## 2018-07-05 MED ORDER — IPRATROPIUM-ALBUTEROL 0.5-2.5 (3) MG/3ML IN SOLN
3.0000 mL | Freq: Four times a day (QID) | RESPIRATORY_TRACT | Status: AC
  Administered 2018-07-06 (×4): 3 mL via RESPIRATORY_TRACT
  Filled 2018-07-05 (×4): qty 3

## 2018-07-05 MED ORDER — LEVOFLOXACIN IN D5W 750 MG/150ML IV SOLN
750.0000 mg | Freq: Once | INTRAVENOUS | Status: AC
  Administered 2018-07-05: 750 mg via INTRAVENOUS
  Filled 2018-07-05: qty 150

## 2018-07-05 MED ORDER — GUAIFENESIN-DM 100-10 MG/5ML PO SYRP
5.0000 mL | ORAL_SOLUTION | ORAL | Status: DC | PRN
  Administered 2018-07-05 – 2018-07-06 (×4): 5 mL via ORAL
  Filled 2018-07-05 (×4): qty 5

## 2018-07-05 MED ORDER — VANCOMYCIN HCL 10 G IV SOLR: 1.0000 g | Freq: Once | INTRAVENOUS | Status: DC

## 2018-07-05 MED ORDER — ENOXAPARIN SODIUM 40 MG/0.4ML ~~LOC~~ SOLN
40.0000 mg | SUBCUTANEOUS | Status: DC
  Administered 2018-07-06 – 2018-07-07 (×2): 40 mg via SUBCUTANEOUS
  Filled 2018-07-05 (×2): qty 0.4

## 2018-07-05 MED ORDER — PREDNISONE 20 MG PO TABS
40.0000 mg | ORAL_TABLET | Freq: Every day | ORAL | Status: DC
  Administered 2018-07-07: 40 mg via ORAL
  Filled 2018-07-05: qty 2

## 2018-07-05 MED ORDER — DEXTROSE 5 % IV SOLN: 250.0000 mg | INTRAVENOUS | Status: DC

## 2018-07-05 MED ORDER — LEVOTHYROXINE SODIUM 25 MCG PO TABS
25.0000 ug | ORAL_TABLET | Freq: Every day | ORAL | Status: DC
  Administered 2018-07-06 – 2018-07-07 (×2): 25 ug via ORAL
  Filled 2018-07-05 (×2): qty 1

## 2018-07-05 MED ORDER — SODIUM CHLORIDE 0.9 % IV SOLN
2.0000 g | Freq: Three times a day (TID) | INTRAVENOUS | Status: DC
  Filled 2018-07-05 (×2): qty 2

## 2018-07-05 MED ORDER — FENTANYL CITRATE (PF) 100 MCG/2ML IJ SOLN
25.0000 ug | Freq: Once | INTRAMUSCULAR | Status: AC
  Administered 2018-07-05: 25 ug via INTRAVENOUS
  Filled 2018-07-05: qty 2

## 2018-07-05 MED ORDER — ENOXAPARIN SODIUM 40 MG/0.4ML ~~LOC~~ SOLN: 40.0000 mg | SUBCUTANEOUS | Status: DC

## 2018-07-05 MED ORDER — MORPHINE SULFATE (PF) 2 MG/ML IV SOLN
2.0000 mg | INTRAVENOUS | Status: DC | PRN
  Administered 2018-07-05 – 2018-07-07 (×12): 2 mg via INTRAVENOUS
  Filled 2018-07-05 (×12): qty 1

## 2018-07-05 MED ORDER — SODIUM CHLORIDE 0.9 % IV BOLUS (SEPSIS)
500.0000 mL | Freq: Once | INTRAVENOUS | Status: AC
  Administered 2018-07-05: 500 mL via INTRAVENOUS

## 2018-07-05 MED ORDER — MORPHINE SULFATE (PF) 4 MG/ML IV SOLN
4.0000 mg | Freq: Once | INTRAVENOUS | Status: AC
  Administered 2018-07-05: 4 mg via INTRAVENOUS
  Filled 2018-07-05: qty 1

## 2018-07-05 MED ORDER — ONDANSETRON HCL 4 MG/2ML IJ SOLN
4.0000 mg | INTRAMUSCULAR | Status: AC
  Administered 2018-07-05: 4 mg via INTRAVENOUS
  Filled 2018-07-05: qty 2

## 2018-07-05 NOTE — ED Notes (Addendum)
Placed on 2L [place] for room air saturation of 88%. Up to 95% with supplemental O2.

## 2018-07-05 NOTE — ED Provider Notes (Addendum)
[facility] Emergency Department Provider Note  ____________________________________________   First MD Initiated Contact with Patient 07/05/18 [phone]     (approximate)  I have reviewed the triage vital signs and the nursing notes.   HISTORY  Chief Complaint Cough    HPI Jennifer Vasquez is a 32 y.o. female with extensive medical and psychiatric history as listed below with 7 visits to the emergency department in the last 6 months.  She presents tonight by private vehicle for evaluation of a variety of complaints including cough, fever, chills, generalized myalgias, shortness of breath, sore throat, and fatigue.  She reports that for about 3 weeks she has been suffering from worsening cough and shortness of breath than usual.  She was seen in the emergency department and put on prednisone and azithromycin.  She completed that round of antibiotics and then followed up with her primary care doctor who started her on a 10-day course of doxycycline.  After she completed that course she was still not feeling better so she was given a second 10-day course which she is still currently taking along with some prednisone.  However she states that 3 to 4 days ago she started feeling worse and is gradually felt worse over that time.,  Becoming acutely worse over the last 24 hours.  She reports that her symptoms are severe and she is currently sobbing in the exam room when she was awaiting my evaluation.  She reports that her whole body hurts but specifically she has sore throat, nasal congestion, and a frequent cough that is occasionally productive.  Her chest does not hurt except for when she coughs.  She denies abdominal pain but reports "daily" nausea and vomiting.  She is also had some diarrhea.  Nothing in particular makes her symptoms better and "everything" makes them worse.  Past Medical History:  Diagnosis Date  . Acute carpal tunnel syndrome 10/10/2014  . Anxiety state  09/03/2009  . Asthma   . Bipolar disorder (HCC) 02/09/2013  . CHF (congestive heart failure) (HCC)   . COPD (chronic obstructive pulmonary disease) (HCC)   . DDD (degenerative disc disease), cervical 11/13/2014  . DDD (degenerative disc disease), lumbar 11/13/2014  . Hepatitis C   . Lumbosacral radiculopathy 11/13/2014  . Tobacco use disorder 09/06/2012    Patient Active Problem List   Diagnosis Date Noted  . Sepsis due to pneumonia (HCC) 07/05/2018  . Community acquired pneumonia of left lung   . Respiratory failure (HCC) 11/05/2016  . DDD (degenerative disc disease), lumbar 11/13/2014  . DDD (degenerative disc disease), cervical 11/13/2014  . Lumbosacral radiculopathy 11/13/2014  . Acute carpal tunnel syndrome 10/10/2014  . Chronic pain 03/02/2013  . Depression 03/02/2013  . Bipolar disorder (HCC) 02/09/2013  . Hepatitis C 12/18/2012  . Asthma 11/23/2012  . Neck pain 11/23/2012  . Torticollis 11/23/2012  . Tobacco use disorder 09/06/2012  . Anxiety state 09/03/2009    Past Surgical History:  Procedure Laterality Date  . CERVICAL DISC ARTHROPLASTY    . TONSILLECTOMY    . TUBAL LIGATION      Prior to Admission medications   Medication Sig Start Date End Date Taking? Authorizing Provider  albuterol (PROVENTIL HFA;VENTOLIN HFA) 108 (90 Base) MCG/ACT inhaler Inhale 2 puffs into the lungs every 6 (six) hours as needed for wheezing or shortness of breath. 01/17/18   Merrily Brittle, MD  albuterol-ipratropium (COMBIVENT) 18-103 MCG/ACT inhaler Inhale 1 puff into the lungs 4 (four) times daily as needed.  [provider]  azithromycin (ZITHROMAX Z-PAK) 250 MG tablet Take 2 tablets (500 mg) on  Day 1,  followed by 1 tablet (250 mg) once daily on Days 2 through 5. Patient not taking: Reported on 05/13/2018 01/17/18   Merrily Brittle, MD  benzonatate (TESSALON PERLES) 100 MG capsule Take 1 capsule (100 mg total) by mouth every 6 (six) hours as needed for cough. Patient not taking:  Reported on 05/13/2018 01/17/18 01/17/19  Merrily Brittle, MD  chlorpheniramine-HYDROcodone [facility] PENNKINETIC ER) 10-8 MG/5ML SUER Take 5 mLs by mouth 2 (two) times daily. Patient not taking: Reported on 05/13/2018 03/21/17   Emily Filbert, MD  citalopram (CELEXA) 20 MG tablet Take 1 tablet by mouth daily. 11/25/16   [provider]  cyclobenzaprine (FLEXERIL) 10 MG tablet 3 (three) times daily.  06/04/16   [provider]  fluticasone (FLONASE) 50 MCG/ACT nasal spray 2 sprays daily as needed.  06/11/16   [provider]  Fluticasone-Salmeterol (ADVAIR DISKUS) 250-50 MCG/DOSE AEPB 1 inhalation 2 (two) times daily. 04/01/14   [provider]  guaiFENesin-codeine 100-10 MG/5ML syrup Take 10 mLs by mouth 3 (three) times daily as needed. Patient not taking: Reported on 05/13/2018 10/29/17   Kem Boroughs B, FNP  ibuprofen (ADVIL,MOTRIN) 800 MG tablet Take 800 mg by mouth every 6 (six) hours as needed for fever or mild pain.     [provider]  ipratropium (ATROVENT) 0.02 % nebulizer solution Take 2.5 mLs (0.5 mg total) by nebulization 4 (four) times daily. 10/29/17   Triplett, Cari B, FNP  levothyroxine (SYNTHROID, LEVOTHROID) 25 MCG tablet Take 25 mcg by mouth daily before breakfast.    [provider]  loratadine (CLARITIN) 10 MG tablet Take 10 mg by mouth daily.    [provider]  nystatin (MYCOSTATIN) 100000 UNIT/ML suspension Use as directed 5 mLs (500,000 Units total) in the mouth or throat 4 (four) times daily. Patient not taking: Reported on 03/21/2017 11/15/16   Milagros Loll, MD  ondansetron (ZOFRAN ODT) 4 MG disintegrating tablet Take 1 tablet (4 mg total) by mouth every 8 (eight) hours as needed for nausea or vomiting. Patient not taking: Reported on 05/13/2018 01/13/17   Midge Minium, MD  ondansetron (ZOFRAN) 4 MG tablet Take 1 tablet (4 mg total) by mouth daily as needed. 05/13/18   Schaevitz, Myra Rude, MD  potassium chloride  (K-DUR) 10 MEQ tablet Take 1 tablet (10 mEq total) by mouth daily. Patient not taking: Reported on 03/21/2017 11/15/16   Milagros Loll, MD  predniSONE (DELTASONE) 50 MG tablet Take 1 tab p.o. daily for 5 days. 05/13/18   Myrna Blazer, MD  tiotropium (SPIRIVA) 18 MCG inhalation capsule Place 18 mcg into inhaler and inhale daily.    [provider]  traZODone (DESYREL) 50 MG tablet Take 50 mg by mouth at bedtime as needed for sleep.    [provider]    Allergies Acetaminophen; Amoxicillin-pot clavulanate; Gabapentin; Tetanus toxoids; Tramadol; and Shellfish allergy  Family History  Problem Relation Age of Onset  . Arthritis Mother   . Asthma Mother   . Diabetes Mother   . Hyperlipidemia Mother   . Vision loss Mother   . Alcohol abuse Father   . Asthma Father   . Diabetes Father   . Heart disease Father   . Depression Father   . COPD Father   . Cancer Father   . Hypertension Father   . Kidney disease Father   . Vision loss Father  Social History Social History   Tobacco Use  . Smoking status: Current Some Day Smoker    Packs/day: 0.50    Types: Cigarettes  . Smokeless tobacco: Never Used  Substance Use Topics  . Alcohol use: Yes    Alcohol/week: 0.0 standard drinks  . Drug use: No    Review of Systems Constitutional: Fever and chills, malaise, myalgias. Eyes: No visual changes. ENT: No sore throat. Cardiovascular: Denies chest pain. Respiratory: Shortness of breath and cough Gastrointestinal: No abdominal pain.  N/V.  No diarrhea.  No constipation. Genitourinary: Negative for dysuria. Musculoskeletal: Generalized body aches everywhere, no specific extremity is involved more than the other. Integumentary: Negative for rash. Neurological: Headache.  No focal weakness or numbness.   ____________________________________________   PHYSICAL EXAM:  VITAL SIGNS: ED Triage Vitals  Enc Vitals Group     BP 07/05/18 0320 (!) 127/99      Pulse Rate 07/05/18 0320 (!) 114     Resp 07/05/18 0320 (!) 22     Temp 07/05/18 0320 98.9 F (37.2 C)     Temp Source 07/05/18 0320 Oral     SpO2 07/05/18 0320 93 %     Weight 07/05/18 0321 81.6 kg (180 lb)     Height 07/05/18 0321 1.626 m (5\' 4" )     Head Circumference --      Peak Flow --      Pain Score 07/05/18 0320 10     Pain Loc --      Pain Edu? --      Excl. in GC? --     Constitutional: Alert and oriented.  Appears uncomfortable and ill but nontoxic. Eyes: Conjunctivae are normal.  Head: Atraumatic. Nose: +congestion/rhinnorhea. Mouth/Throat: Mucous membranes are moist.  Oropharynx erythematous. Neck: No stridor.  No meningeal signs.   Cardiovascular: Tachycardia, regular rhythm. Good peripheral circulation. Grossly normal heart sounds. Respiratory: Slightly increased respiratory effort.  No retractions.  Coarse breath sounds throughout. Gastrointestinal: Soft and nontender. No distention.  Musculoskeletal: No lower extremity tenderness nor edema. No gross deformities of extremities. Neurologic:  Normal speech and language. No gross focal neurologic deficits are appreciated.  Skin:  Skin is warm, dry and intact. No rash noted. Psychiatric: Patient is sobbing in the room.   ____________________________________________   LABS (all labs ordered are listed, but only abnormal results are displayed)  Labs Reviewed  LACTIC ACID, PLASMA - Abnormal; Notable for the following components:      Result Value   Lactic Acid, Venous 2.5 (*)    All other components within normal limits  COMPREHENSIVE METABOLIC PANEL - Abnormal; Notable for the following components:   Glucose, Bld 134 (*)    Calcium 8.8 (*)    All other components within normal limits  BRAIN NATRIURETIC PEPTIDE - Abnormal; Notable for the following components:   B Natriuretic Peptide 479.0 (*)    All other components within normal limits  CBC WITH DIFFERENTIAL/PLATELET - Abnormal; Notable for the following  components:   WBC 17.4 (*)    Neutro Abs 15.3 (*)    Abs Immature Granulocytes 0.27 (*)    All other components within normal limits  URINALYSIS, COMPLETE (UACMP) WITH MICROSCOPIC - Abnormal; Notable for the following components:   Color, Urine YELLOW (*)    APPearance CLEAR (*)    Hgb urine dipstick LARGE (*)    Protein, ur 30 (*)    Leukocytes, UA MODERATE (*)    All other components within normal limits  GROUP A  STREP BY PCR  CULTURE, BLOOD (ROUTINE X 2)  CULTURE, BLOOD (ROUTINE X 2)  URINE CULTURE  EXPECTORATED SPUTUM ASSESSMENT W REFEX TO RESP CULTURE  GRAM STAIN  MRSA PCR SCREENING  INFLUENZA PANEL BY PCR (TYPE A & B)  LIPASE, BLOOD  TROPONIN I  PROCALCITONIN  PROTIME-INR  MAGNESIUM  PHOSPHORUS  LACTIC ACID, PLASMA  HIV ANTIBODY (ROUTINE TESTING W REFLEX)  STREP PNEUMONIAE URINARY ANTIGEN  LEGIONELLA PNEUMOPHILA SEROGP 1 UR AG  CALCIUM, IONIZED   ____________________________________________  EKG  ED ECG REPORT I, Loleta Rose, the attending physician, personally viewed and interpreted this ECG.  Date: 07/05/2018 EKG Time: 05:19 Rate: 106 Rhythm: Sinus tachycardia QRS Axis: normal Intervals: normal ST/T Wave abnormalities: No acute abnormalities indicative of ischemia Narrative Interpretation: no definitive evidence of acute ischemia; does not meet STEMI criteria.   ____________________________________________  RADIOLOGY I, Loleta Rose, personally viewed and evaluated these images (plain radiographs) as part of my medical decision making, as well as reviewing the written report by the radiologist.  ED MD interpretation: Pneumonia most notable in the left lower lobe  Official radiology report(s): Dg Chest 2 View  Result Date: 07/05/2018 CLINICAL DATA:  Subacute onset of sore throat, cough and nasal congestion. EXAM: CHEST - 2 VIEW COMPARISON:  Chest radiograph performed 05/13/2018 FINDINGS: The lungs are well-aerated. Patchy bilateral airspace  opacification is compatible with multifocal pneumonia. There is no evidence of pleural effusion or pneumothorax. The heart is borderline normal in size. Cervical spinal fusion hardware is noted. No acute osseous abnormalities are seen. IMPRESSION: Patchy bilateral airspace opacification is compatible with multifocal pneumonia. Followup PA and lateral chest X-ray is recommended in 3-4 weeks following trial of antibiotic therapy to ensure resolution and exclude underlying malignancy. Electronically Signed   By: Roanna Raider M.D.   On: 07/05/2018 03:45    ____________________________________________   PROCEDURES  Critical Care performed: Yes, see critical care procedure note(s)   Procedure(s) performed:   .Critical Care Performed by: Loleta Rose, MD Authorized by: Loleta Rose, MD   Critical care provider statement:    Critical care time (minutes):  30   Critical care time was exclusive of:  Separately billable procedures and treating other patients   Critical care was necessary to treat or prevent imminent or life-threatening deterioration of the following conditions:  Sepsis   Critical care was time spent personally by me on the following activities:  Development of treatment plan with patient or surrogate, discussions with consultants, evaluation of patient's response to treatment, examination of patient, obtaining history from patient or surrogate, ordering and performing treatments and interventions, ordering and review of laboratory studies, ordering and review of radiographic studies, pulse oximetry, re-evaluation of patient's condition and review of old charts     ____________________________________________   INITIAL IMPRESSION / ASSESSMENT AND PLAN / ED COURSE  As part of my medical decision making, I reviewed the following data within the electronic MEDICAL RECORD NUMBER Nursing notes reviewed and incorporated, Labs reviewed , Old chart reviewed, Discussed with admitting  physician , Notes from prior ED visits and [place] Controlled Substance Database    Differential diagnosis includes, but is not limited to, failure of outpatient antibiotics for community-acquired pneumonia, COPD exacerbation, bacteremia, influenza.  Influenza studies are pending.  Chest x-ray demonstrates multifocal pneumonia and she has failed on multiple courses of outpatient antibiotics.  Lactic acid is 2.5, pro calcitonin is elevated, comprehensive metabolic panel is generally reassuring, leukocytosis 17.4.  Patient meets sepsis criteria and I ordered 2.5 L  of normal saline to meet the 30 mL/kg goal.  I ordered Levaquin 750 mg IV based on her age, comorbidities, and history, as well as vancomycin 1 g IV for staph coverage.  I discussed the case in person with the hospitalist who will admit.  Clinical Course as of Jul 06 727  Wed Jul 05, 2018  [id number] Influenza A By PCR: NEGATIVE [CF]  0454 Influenza B By PCR: NEGATIVE [CF]    Clinical Course User Index [CF] Loleta RoseForbach, Johnmark Geiger, MD    ____________________________________________  FINAL CLINICAL IMPRESSION(S) / ED DIAGNOSES  Final diagnoses:  Community acquired pneumonia, unspecified laterality  Sepsis Acute respiratory failure with hypoxemia  MEDICATIONS GIVEN DURING THIS VISIT:  Medications  senna-docusate (Senokot-S) tablet 1 tablet (has no administration in time range)  bisacodyl (DULCOLAX) EC tablet 5 mg (has no administration in time range)  ondansetron (ZOFRAN) tablet 4 mg (has no administration in time range)    Or  ondansetron (ZOFRAN) injection 4 mg (has no administration in time range)  enoxaparin (LOVENOX) injection 40 mg (has no administration in time range)  citalopram (CELEXA) tablet 20 mg (has no administration in time range)  mometasone-formoterol (DULERA) 200-5 MCG/ACT inhaler 2 puff (has no administration in time range)  levothyroxine (SYNTHROID, LEVOTHROID) tablet 25 mcg (has no administration in time range)  morphine 2  MG/ML injection 2 mg (has no administration in time range)    Or  morphine 4 MG/ML injection 4 mg (has no administration in time range)  ipratropium-albuterol (DUONEB) 0.5-2.5 (3) MG/3ML nebulizer solution 3 mL (3 mLs Nebulization Given 07/05/18 0727)    Followed by  ipratropium-albuterol (DUONEB) 0.5-2.5 (3) MG/3ML nebulizer solution 3 mL (has no administration in time range)    Followed by  ipratropium-albuterol (DUONEB) 0.5-2.5 (3) MG/3ML nebulizer solution 3 mL (has no administration in time range)  methylPREDNISolone sodium succinate (SOLU-MEDROL) 125 mg/2 mL injection 125 mg (125 mg Intravenous Given 07/05/18 0525)    Followed by  methylPREDNISolone sodium succinate (SOLU-MEDROL) 125 mg/2 mL injection 60 mg (has no administration in time range)    Followed by  predniSONE (DELTASONE) tablet 40 mg (has no administration in time range)  vancomycin (VANCOCIN) 1,250 mg in sodium chloride 0.9 % 250 mL IVPB (has no administration in time range)  levofloxacin (LEVAQUIN) IVPB 750 mg (has no administration in time range)  Influenza vac split quadrivalent PF (FLUARIX) injection 0.5 mL (has no administration in time range)  morphine 4 MG/ML injection 4 mg (4 mg Intravenous Given 07/05/18 0446)  ondansetron (ZOFRAN) injection 4 mg (4 mg Intravenous Given 07/05/18 0444)  sodium chloride 0.9 % bolus 1,000 mL (1,000 mLs Intravenous New Bag/Given 07/05/18 0530)    And  sodium chloride 0.9 % bolus 1,000 mL (0 mLs Intravenous Stopped 07/05/18 0558)    And  sodium chloride 0.9 % bolus 500 mL (500 mLs Intravenous New Bag/Given 07/05/18 0558)  levofloxacin (LEVAQUIN) IVPB 750 mg ( Intravenous Rate/Dose Verify 07/05/18 0620)  vancomycin (VANCOCIN) IVPB 1000 mg/200 mL premix ( Intravenous Rate/Dose Verify 07/05/18 0618)  fentaNYL (SUBLIMAZE) injection 25 mcg (25 mcg Intravenous Given 07/05/18 0541)     ED Discharge Orders    None       Note:  This document was prepared using Dragon voice recognition  software and may include unintentional dictation errors.    Loleta RoseForbach, Paulmichael Schreck, MD 07/05/18 [id number]    Loleta RoseForbach, Marymargaret Kirker, MD 07/18/18 1446    Loleta RoseForbach, Merrit Friesen, MD 07/22/18 1623    Loleta RoseForbach, Donnica Jarnagin, MD 08/01/18 2041

## 2018-07-05 NOTE — H&P (Signed)
[employer] - [place] at [facility]   PATIENT NAME: Jennifer Vasquez    MR#:  [id number]  DATE OF BIRTH:  February 03, 1986  DATE OF ADMISSION:  07/05/2018  PRIMARY CARE PHYSICIAN: [facility]   REQUESTING/REFERRING PHYSICIAN: Loleta Rose, MD  CHIEF COMPLAINT:   Chief Complaint  Patient presents with  . Cough    HISTORY OF PRESENT ILLNESS:  Jennifer Vasquez  is a 32 y.o. female with a known history of COPD/asthma, bipolar D/O p/w cough/SOB, sepsis 2/2 pneumonia. She has had multiple recent ED visits w/o inpt hospitalization (04/05/2018, 04/09/2018, 04/22/2018, 05/13/2018) for cough/SOB. She has been on multiple courses of outpt ABx (Doxycycline), and was also recently Rx outpt ABx, all w/o improvement. She tells me she has felt poorly for the past six weeks, w/ non-productive cough, progressively-worsening SOB, fever and chills. She states each time she was started on ABx, she felt slightly better for a short period of time, w/ recurrence/worsening following completion of each treatment course. She endorses wheezing and states her asthma/COPD has continued to cause her trouble at home. She endorses insomnia up until yesterday. She states she was asleep nearly all day (Tuesday 07/04/2018). Her young son had fed breakfast, lunch and dinner to his younger sister over the day, and finally woke his mother up and told her he thought she should go to the hospital. She endorses diaphoresis, nights sweats and rigors. She endorses N/V + PO intolerance x2wks, and diarrhea since yesterday. States cough has become productive of dark brown sputum x2-3d. Endorses midchest pleurisy w/ cough/deep inspiration. Moderate distress 2/2 pleurisy, SOB. Appears ill, clammy/diaphoretic, toxic. Hypoxemic (SpO2 low 88% on RA), wheezing. SIRS (+). CXR (+) "Patchy bilateral airspace opacification is compatible with multifocal pneumonia." (+) sepsis.  11/06/2016 Echo demonstrates EF  55-65%. There is no mention of diastolic dysfxn in the "study conclusions". Much of the pt's prior documentation reflects a Hx of CHF, but no evidence exists to corroborate this claim. If she indeed has CHF, I would like to see evidence of it. She is volume depleted on present admission. BNP is 479, but I suspect this is due to lung disease.  PAST MEDICAL HISTORY:   Past Medical History:  Diagnosis Date  . Acute carpal tunnel syndrome 10/10/2014  . Anxiety state 09/03/2009  . Asthma   . Bipolar disorder (HCC) 02/09/2013  . CHF (congestive heart failure) (HCC)   . COPD (chronic obstructive pulmonary disease) (HCC)   . DDD (degenerative disc disease), cervical 11/13/2014  . DDD (degenerative disc disease), lumbar 11/13/2014  . Hepatitis C   . Lumbosacral radiculopathy 11/13/2014  . Tobacco use disorder 09/06/2012    PAST SURGICAL HISTORY:   Past Surgical History:  Procedure Laterality Date  . CERVICAL DISC ARTHROPLASTY    . TONSILLECTOMY    . TUBAL LIGATION      SOCIAL HISTORY:   Social History   Tobacco Use  . Smoking status: Current Some Day Smoker    Packs/day: 0.50    Types: Cigarettes  . Smokeless tobacco: Never Used  Substance Use Topics  . Alcohol use: Yes    Alcohol/week: 0.0 standard drinks    FAMILY HISTORY:   Family History  Problem Relation Age of Onset  . Arthritis Mother   . Asthma Mother   . Diabetes Mother   . Hyperlipidemia Mother   . Vision loss Mother   . Alcohol abuse Father   . Asthma Father   . Diabetes Father   .  Heart disease Father   . Depression Father   . COPD Father   . Cancer Father   . Hypertension Father   . Kidney disease Father   . Vision loss Father     DRUG ALLERGIES:   Allergies  Allergen Reactions  . Acetaminophen     Other reaction(s): Other (qualifier value) Due to hep c  . Amoxicillin-Pot Clavulanate Diarrhea and Nausea And Vomiting  . Gabapentin Hives  . Tetanus Toxoids   . Tramadol Hives  . Shellfish Allergy Rash      oysters    REVIEW OF SYSTEMS:   Review of Systems  Constitutional: Positive for chills, diaphoresis, fever and malaise/fatigue. Negative for weight loss.  HENT: Positive for congestion. Negative for ear pain, hearing loss, nosebleeds, sinus pain, sore throat and tinnitus.   Eyes: Negative for blurred vision, double vision and photophobia.  Respiratory: Positive for cough, sputum production, shortness of breath and wheezing. Negative for hemoptysis.   Cardiovascular: Positive for chest pain. Negative for palpitations, orthopnea, claudication, leg swelling and PND.  Gastrointestinal: Positive for diarrhea, nausea and vomiting. Negative for abdominal pain, blood in stool, constipation, heartburn and melena.  Genitourinary: Negative for dysuria, frequency, hematuria and urgency.  Musculoskeletal: Negative for back pain, falls, joint pain, myalgias and neck pain.  Skin: Negative for itching and rash.  Neurological: Positive for weakness. Negative for dizziness, tingling, tremors, sensory change, speech change, focal weakness, seizures, loss of consciousness and headaches.  Psychiatric/Behavioral: Negative for depression and memory loss. The patient has insomnia. The patient is not nervous/anxious.    MEDICATIONS AT HOME:   Prior to Admission medications   Medication Sig Start Date End Date Taking? Authorizing Provider  albuterol (PROVENTIL HFA;VENTOLIN HFA) 108 (90 Base) MCG/ACT inhaler Inhale 2 puffs into the lungs every 6 (six) hours as needed for wheezing or shortness of breath. 01/17/18   Merrily Brittle, MD  albuterol-ipratropium (COMBIVENT) 18-103 MCG/ACT inhaler Inhale 1 puff into the lungs 4 (four) times daily as needed.     [provider]  azithromycin (ZITHROMAX Z-PAK) 250 MG tablet Take 2 tablets (500 mg) on  Day 1,  followed by 1 tablet (250 mg) once daily on Days 2 through 5. Patient not taking: Reported on 05/13/2018 01/17/18   Merrily Brittle, MD  benzonatate (TESSALON  PERLES) 100 MG capsule Take 1 capsule (100 mg total) by mouth every 6 (six) hours as needed for cough. Patient not taking: Reported on 05/13/2018 01/17/18 01/17/19  Merrily Brittle, MD  chlorpheniramine-HYDROcodone [facility] PENNKINETIC ER) 10-8 MG/5ML SUER Take 5 mLs by mouth 2 (two) times daily. Patient not taking: Reported on 05/13/2018 03/21/17   Emily Filbert, MD  citalopram (CELEXA) 20 MG tablet Take 1 tablet by mouth daily. 11/25/16   [provider]  cyclobenzaprine (FLEXERIL) 10 MG tablet 3 (three) times daily.  06/04/16   [provider]  fluticasone (FLONASE) 50 MCG/ACT nasal spray 2 sprays daily as needed.  06/11/16   [provider]  Fluticasone-Salmeterol (ADVAIR DISKUS) 250-50 MCG/DOSE AEPB 1 inhalation 2 (two) times daily. 04/01/14   [provider]  guaiFENesin-codeine 100-10 MG/5ML syrup Take 10 mLs by mouth 3 (three) times daily as needed. Patient not taking: Reported on 05/13/2018 10/29/17   Kem Boroughs B, FNP  ibuprofen (ADVIL,MOTRIN) 800 MG tablet Take 800 mg by mouth every 6 (six) hours as needed for fever or mild pain.     [provider]  ipratropium (ATROVENT) 0.02 % nebulizer solution Take 2.5 mLs (0.5 mg  total) by nebulization 4 (four) times daily. 10/29/17   Triplett, Cari B, FNP  levothyroxine (SYNTHROID, LEVOTHROID) 25 MCG tablet Take 25 mcg by mouth daily before breakfast.    [provider]  loratadine (CLARITIN) 10 MG tablet Take 10 mg by mouth daily.    [provider]  nystatin (MYCOSTATIN) 100000 UNIT/ML suspension Use as directed 5 mLs (500,000 Units total) in the mouth or throat 4 (four) times daily. Patient not taking: Reported on 03/21/2017 11/15/16   Milagros Loll, MD  ondansetron (ZOFRAN ODT) 4 MG disintegrating tablet Take 1 tablet (4 mg total) by mouth every 8 (eight) hours as needed for nausea or vomiting. Patient not taking: Reported on 05/13/2018 01/13/17   Midge Minium, MD  ondansetron (ZOFRAN)  4 MG tablet Take 1 tablet (4 mg total) by mouth daily as needed. 05/13/18   Schaevitz, Myra Rude, MD  potassium chloride (K-DUR) 10 MEQ tablet Take 1 tablet (10 mEq total) by mouth daily. Patient not taking: Reported on 03/21/2017 11/15/16   Milagros Loll, MD  predniSONE (DELTASONE) 50 MG tablet Take 1 tab p.o. daily for 5 days. 05/13/18   Myrna Blazer, MD  tiotropium (SPIRIVA) 18 MCG inhalation capsule Place 18 mcg into inhaler and inhale daily.    [provider]  traZODone (DESYREL) 50 MG tablet Take 50 mg by mouth at bedtime as needed for sleep.    [provider]      VITAL SIGNS:  Blood pressure (!) 159/106, pulse (!) 108, temperature 98.9 F (37.2 C), temperature source Oral, resp. rate 20, height 5\' 4"  (1.626 m), weight 81.6 kg, last menstrual period 07/05/2018, SpO2 95 %.  PHYSICAL EXAMINATION:  Physical Exam Constitutional:      General: She is awake. She is in acute distress.     Appearance: Normal appearance. She is well-developed and overweight. She is ill-appearing, toxic-appearing and diaphoretic.     Interventions: She is not intubated.Nasal cannula in place.  HENT:     Head: Atraumatic.  Eyes:     General: Lids are normal. No scleral icterus.    Extraocular Movements: Extraocular movements intact.     Conjunctiva/sclera: Conjunctivae normal.  Neck:     Musculoskeletal: Normal range of motion.  Cardiovascular:     Rate and Rhythm: Regular rhythm. Tachycardia present.  No extrasystoles are present.    Heart sounds: Normal heart sounds, S1 normal and S2 normal. Heart sounds not distant. No murmur. No friction rub. No gallop. No S3 or S4 sounds.   Pulmonary:     Effort: Tachypnea and respiratory distress present. No bradypnea, accessory muscle usage or retractions. She is not intubated.     Breath sounds: Decreased air movement present. No stridor or transmitted upper airway sounds. Examination of the right-upper field reveals decreased  breath sounds and wheezing. Examination of the left-upper field reveals decreased breath sounds and wheezing. Examination of the right-middle field reveals decreased breath sounds and wheezing. Examination of the left-middle field reveals decreased breath sounds and wheezing. Examination of the right-lower field reveals decreased breath sounds and wheezing. Examination of the left-lower field reveals decreased breath sounds, wheezing and rhonchi. Decreased breath sounds, wheezing and rhonchi present. No rales.  Abdominal:     General: Abdomen is flat. Bowel sounds are decreased. There is no distension.     Palpations: Abdomen is soft.     Tenderness: There is no abdominal tenderness. There is no guarding or rebound.  Musculoskeletal: Normal range of motion.  General: No swelling or tenderness.     Right lower leg: No edema.     Left lower leg: No edema.  Lymphadenopathy:     Cervical: No cervical adenopathy.  Skin:    General: Skin is warm.     Findings: No erythema or rash.  Neurological:     General: No focal deficit present.     Mental Status: She is alert and oriented to person, place, and time. Mental status is at baseline.  Psychiatric:        Attention and Perception: Attention and perception normal. She is attentive. She does not perceive auditory or visual hallucinations.        Mood and Affect: Mood is anxious. Mood is not elated. Affect is tearful. Affect is not labile, blunt, flat, angry or inappropriate.        Speech: She is communicative. Speech is not rapid and pressured, delayed, slurred or tangential.        Behavior: Behavior is agitated. Behavior is not slowed, aggressive, withdrawn, hyperactive or combative. Behavior is cooperative.        Thought Content: Thought content normal. Thought content is not paranoid or delusional. Thought content does not include homicidal or suicidal ideation.        Cognition and Memory: Cognition and memory normal. Cognition is not  impaired. Memory is not impaired. She does not exhibit impaired recent memory or impaired remote memory.        Judgment: Judgment normal. Judgment is not impulsive or inappropriate.    Diffusely diminished, poor air movement. Bibasilar coarse rhonchi, diffuse high-pitched end-expiratory wheezing. LABORATORY PANEL:   CBC Recent Labs  Lab 07/05/18 0436  WBC 17.4*  HGB 12.6  HCT 37.9  PLT 290   ------------------------------------------------------------------------------------------------------------------  Chemistries  No results for input(s): NA, K, CL, CO2, GLUCOSE, BUN, CREATININE, CALCIUM, MG, AST, ALT, ALKPHOS, BILITOT in the last 168 hours.  Invalid input(s): GFRCGP ------------------------------------------------------------------------------------------------------------------  Cardiac Enzymes No results for input(s): TROPONINI in the last 168 hours. ------------------------------------------------------------------------------------------------------------------  RADIOLOGY:  Dg Chest 2 View  Result Date: 07/05/2018 CLINICAL DATA:  Subacute onset of sore throat, cough and nasal congestion. EXAM: CHEST - 2 VIEW COMPARISON:  Chest radiograph performed 05/13/2018 FINDINGS: The lungs are well-aerated. Patchy bilateral airspace opacification is compatible with multifocal pneumonia. There is no evidence of pleural effusion or pneumothorax. The heart is borderline normal in size. Cervical spinal fusion hardware is noted. No acute osseous abnormalities are seen. IMPRESSION: Patchy bilateral airspace opacification is compatible with multifocal pneumonia. Followup PA and lateral chest X-ray is recommended in 3-4 weeks following trial of antibiotic therapy to ensure resolution and exclude underlying malignancy. Electronically Signed   By: Roanna RaiderJeffery  Chang M.D.   On: 07/05/2018 03:45   IMPRESSION AND PLAN:   A/P: 39F w/ PMHx COPD/asthma p/w acute hypoxemic respiratory failure, SIRS,  multifocal pneumonia (failed outpt ABx), sepsis (w/o septic shock), acute on chronic asthma/COPD exacerbation. Hyperglycemia, hypocalcemia, BNP elevation, leukocytosis. -Acute hypoxemic respiratory failure, SIRS, pneumonia, sepsis, acute on chronic asthma/COPD exacerbation: Pt p/w ~6wk Hx respiratory + infectious symptoms, as per HPI. Afebrile in ED, but hypoxemic, tachycardic, tachypneic. WBC 17.4 (though pt recently on outpt steroids). SIRS (+). Appears toxic/diaphoretic. Exam (+) wheezing + rhonchi. CXR (+) "Patchy bilateral airspace opacification is compatible with multifocal pneumonia." Failed outpt ABx. (+) sepsis, (-) shock. PCT 0.88, Lactate 2.2. Levaquin + Vancomycin started in ED. (+) rigors, concerning for bacteremia. BCx, sputum Cx/gram, rapid Flu, UStrep + ULeg Ag pending. (+) wheezing, acute  on chronic asthma/COPD exacerbation. Duonebs, steroids (IV, transition to PO), incentive spirometry, pulmonary toileting, O2, ABx as above. Cardiac monitoring, pulse oximetry. BNP elevated (479), w/ no diagnostic data to prove a Hx of either systolic or diastolic heart failure; finding is likely 2/2 lung disease, and will not affect my fluid management plan. -Hypocalcemia: Ionized calcium. -BNP elevation: No diagnostic data to prove a Hx of either systolic or diastolic heart failure; finding is likely 2/2 lung disease. -c/w home meds/formulary subs. -FEN/GI: Regular diet. -DVT PPx: Lovenox. -Code status: Full code. -Disposition: Admission, > 2 midnights.   All the records are reviewed and case discussed with ED provider. Management plans discussed with the patient, family and they are in agreement.  CODE STATUS: Full code.  TOTAL TIME TAKING CARE OF THIS PATIENT: 75 minutes.    Barbaraann Rondo M.D on 07/05/2018 at 5:03 AM  Between 7am to 6pm - Pager - [phone]  After 6pm go to [email or web address] - Scientist, research (life sciences) [facility]  Office  [phone]  CC:  Primary care physician; [facility]   Note: This dictation was prepared with Nurse, children's dictation along with smaller phrase technology. Any transcriptional errors that result from this process are unintentional.

## 2018-07-05 NOTE — ED Notes (Signed)
Pt off the floor with NA via stretcher at this time.

## 2018-07-05 NOTE — ED Triage Notes (Signed)
Pt states two weeks of sore throat, cough, nasal congestion. Pt with dry cough noted in triage. Pt is a smoker.

## 2018-07-05 NOTE — ED Notes (Signed)
Droplet precautions initiated, call bell within reach. Pt awaits MD for evaluation at this time.

## 2018-07-05 NOTE — ED Notes (Signed)
Provider at bedside at this time

## 2018-07-05 NOTE — Progress Notes (Signed)
CODE SEPSIS - PHARMACY COMMUNICATION  **Broad Spectrum Antibiotics should be administered within 1 hour of Sepsis diagnosis**  Time Code Sepsis Called/Page Received: 1225 00429  Antibiotics Ordered: 1225 0447  Time of 1st antibiotic administration: 1225 0527  Additional action taken by pharmacy:   If necessary, Name of Provider/Nurse Contacted:     Erich Montane ,PharmD Clinical Pharmacist  07/05/2018  5:37 AM

## 2018-07-05 NOTE — Progress Notes (Signed)
[employer] - [place] at [facility]   PATIENT NAME: Jennifer Vasquez    MR#:  [id number]  DATE OF BIRTH:  [birth year]  SUBJECTIVE:  CHIEF COMPLAINT:   Chief Complaint  Patient presents with  . Cough   Patient has complain of recurrent pneumonia and not getting completely well from that for last few months.  Came with lethargy cough and hypoxia.  Noted to have multilobar pneumonia on chest x-ray.  Feels slightly better today but still continue to have cough and wheezing.  REVIEW OF SYSTEMS:  CONSTITUTIONAL: No fever, fatigue or weakness.  EYES: No blurred or double vision.  EARS, NOSE, AND THROAT: No tinnitus or ear pain.  RESPIRATORY: Have cough, shortness of breath, wheezing, no hemoptysis.  CARDIOVASCULAR: No chest pain, orthopnea, edema.  GASTROINTESTINAL: No nausea, vomiting, diarrhea or abdominal pain.  GENITOURINARY: No dysuria, hematuria.  ENDOCRINE: No polyuria, nocturia,  HEMATOLOGY: No anemia, easy bruising or bleeding SKIN: No rash or lesion. MUSCULOSKELETAL: No joint pain or arthritis.   NEUROLOGIC: No tingling, numbness, weakness.  PSYCHIATRY: No anxiety or depression.   ROS  DRUG ALLERGIES:   Allergies  Allergen Reactions  . Acetaminophen     Other reaction(s): Other (qualifier value) Due to hep c  . Amoxicillin-Pot Clavulanate Diarrhea and Nausea And Vomiting  . Gabapentin Hives  . Tetanus Toxoids   . Tramadol Hives  . Shellfish Allergy Rash    oysters    VITALS:  Blood pressure 126/85, pulse 89, temperature 97.8 F (36.6 C), temperature source Oral, resp. rate 19, height 5\' 4"  (1.626 m), weight 81.6 kg, last menstrual period 07/05/2018, SpO2 95 %.  PHYSICAL EXAMINATION:  GENERAL:  32 y.o.-year-old patient lying in the bed with no acute distress.  EYES: Pupils equal, round, reactive to light and accommodation. No scleral icterus. Extraocular muscles intact.  HEENT: Head atraumatic, normocephalic. Oropharynx and nasopharynx clear.   NECK:  Supple, no jugular venous distention. No thyroid enlargement, no tenderness.  LUNGS: Normal breath sounds bilaterally, some wheezing, bilateral crepitation. No use of accessory muscles of respiration.  CARDIOVASCULAR: S1, S2 normal. No murmurs, rubs, or gallops.  ABDOMEN: Soft, nontender, nondistended. Bowel sounds present. No organomegaly or mass.  EXTREMITIES: No pedal edema, cyanosis, or clubbing.  NEUROLOGIC: Cranial nerves II through XII are intact. Muscle strength 5/5 in all extremities. Sensation intact. Gait not checked.  PSYCHIATRIC: The patient is alert and oriented x 3.  SKIN: No obvious rash, lesion, or ulcer.   Physical Exam LABORATORY PANEL:   CBC Recent Labs  Lab 07/05/18 0436  WBC 17.4*  HGB 12.6  HCT 37.9  PLT 290   ------------------------------------------------------------------------------------------------------------------  Chemistries  Recent Labs  Lab 07/05/18 0436  NA 142  K 4.1  CL 104  CO2 28  GLUCOSE 134*  BUN 9  CREATININE 0.71  CALCIUM 8.8*  MG 2.2  AST 18  ALT 17  ALKPHOS 61  BILITOT 0.4   ------------------------------------------------------------------------------------------------------------------  Cardiac Enzymes Recent Labs  Lab 07/05/18 0436  TROPONINI <0.03   ------------------------------------------------------------------------------------------------------------------  RADIOLOGY:  Dg Chest 2 View  Result Date: 07/05/2018 CLINICAL DATA:  Subacute onset of sore throat, cough and nasal congestion. EXAM: CHEST - 2 VIEW COMPARISON:  Chest radiograph performed 05/13/2018 FINDINGS: The lungs are well-aerated. Patchy bilateral airspace opacification is compatible with multifocal pneumonia. There is no evidence of pleural effusion or pneumothorax. The heart is borderline normal in size. Cervical spinal fusion hardware is noted. No acute osseous abnormalities are seen. IMPRESSION: Patchy bilateral airspace  opacification  is compatible with multifocal pneumonia. Followup PA and lateral chest X-ray is recommended in 3-4 weeks following trial of antibiotic therapy to ensure resolution and exclude underlying malignancy. Electronically Signed   By: Roanna Raider M.D.   On: 07/05/2018 03:45   Ct Chest W Contrast  Result Date: 07/05/2018 CLINICAL DATA:  Pneumonia, cough, fever, chills, generalized myalgias, shortness of breath, sore throat and fatigue for 3 weeks with worsened symptoms over past few days, history asthma, CHF, COPD, smoker EXAM: CT CHEST WITH CONTRAST TECHNIQUE: Multidetector CT imaging of the chest was performed during intravenous contrast administration. Sagittal and coronal MPR images reconstructed from axial data set. CONTRAST:  32mL OMNIPAQUE IOHEXOL 300 MG/ML  SOLN COMPARISON:  None FINDINGS: Cardiovascular: Aorta normal caliber. No pericardial effusion. Thoracic vascular structures grossly unremarkable for technique. Mediastinum/Nodes: Esophagus normal appearance. Small parenchymal calcification medial to the LEFT thyroid lobe, nonspecific. Base of cervical region otherwise normal appearance. No thoracic adenopathy. Lungs/Pleura: Patchy BILATERAL airspace infiltrates consistent with pneumonia. No pleural effusion or pneumothorax. No definite pulmonary mass or nodule. Upper Abdomen: Beam hardening artifacts traverse liver. Visualized upper abdomen otherwise unremarkable. Musculoskeletal: Prior cervical spine fusion. No acute thoracic osseous abnormalities. IMPRESSION: Patchy BILATERAL airspace infiltrates diffusely throughout both lungs consistent with pneumonia. Electronically Signed   By: Ulyses Southward M.D.   On: 07/05/2018 14:54    ASSESSMENT AND PLAN:   Active Problems:   Sepsis due to pneumonia (HCC)  *Acute hypoxic respiratory failure, multilobar pneumonia COPD exacerbation, sepsis  Continue antibiotics.  Patient has recurrent pneumonia complaints and has history of hepatitis C. She  is also a drug abuser.  Flu test is negative MRSA is negative. I would check for HIV and hepatitis C viral RNA. We will check for Legionella and streptococcal antigen.  Sputum culture. Called ID and pulmonary consult.  *COPD exacerbation and respiratory failure Oxygen supplementation, IV steroid, nebulizer therapy.  *Lactic acidosis Likely due to infection continue to monitor.  *History of CHF-not sure if this is true.. Patient's echocardiogram appears completely normal 1 year ago.  All the records are reviewed and case discussed with Care Management/Social Workerr. Management plans discussed with the patient, family and they are in agreement.  CODE STATUS: Full code.  TOTAL TIME TAKING CARE OF THIS PATIENT: 35 minutes.     POSSIBLE D/C IN 1-2 DAYS, DEPENDING ON CLINICAL CONDITION.   Altamese Dilling M.D on 07/05/2018   Between 7am to 6pm - Pager - [phone]  After 6pm go to [email or web address] - Social research officer, government  [facility]  Office  [phone]  CC: Primary care physician; [facility]  Note: This dictation was prepared with Nurse, children's dictation along with smaller phrase technology. Any transcriptional errors that result from this process are unintentional.

## 2018-07-05 NOTE — Consult Note (Signed)
NAME: Jennifer Vasquez  DOB: 11-Feb-1986  MRN: [id number]  Date/Time: 07/05/2018 2:55 PM   Dr. Elisabeth Pigeon Subjective:  REASON FOR CONSULT: Pneumonia ? Jennifer Vasquez is a 32 y.o. female with a history of hepatitis C, COPD, history of polysubstance use presents with cough of few weeks duration.  Patient states she has had intermittent cough for almost 6 months but it has gotten worse in the last few weeks.  She goes to [facility] and she got doxycycline which she has been taking it for the past [redacted] weeks along with prednisone 60 mg p.o. twice daily.  Not much of  relief from both.  She says she has subjective fever. She coughs up green sputum sometimes and occasionally it is blood-tinged.  The past 2 weeks she has shortness of breath while lying in bed and feels like she is drowning.  She does not have any ankle edema.  She continues to smoke cigarettes and initially denied using cocaine but her urine tox screen was positive for cocaine and then she said she snorts cocaine frequently.  She does not use e-cigarette. denied IV drug use. she has been to the ED multiple times without hospitalization on 04/05/2018 and then 929 04/22/2010 2 for cough and shortness of breath.  In 2018 she was hospitalized for influenza B causing  pneumonia along with pneumococcus and had to be intubated.   She has no sick contacts.  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  She has 2 children 11 and 6 and they are fine.  She has 4 cats at home.  And she worries whether she is allergic to them.  She has not traveled in many years now.  Her hobbies does not include water sports or any activities that would expose her to soil fungus.  She does say her father's house has mold.  She had hepatitis C and was treated with Harvoni in 2018. In the ED her blood pressure was 159/106, heart rate of 108, temperature of 98.9 and SaO2 of 95% Past Medical History:  Diagnosis Date  . Acute carpal tunnel syndrome 10/10/2014  . Anxiety  state 09/03/2009  . Asthma   . Bipolar disorder (HCC) 02/09/2013  . CHF (congestive heart failure) (HCC)   . COPD (chronic obstructive pulmonary disease) (HCC)   . DDD (degenerative disc disease), cervical 11/13/2014  . DDD (degenerative disc disease), lumbar 11/13/2014  . Hepatitis C   . Lumbosacral radiculopathy 11/13/2014  . Tobacco use disorder 09/06/2012    Past Surgical History:  Procedure Laterality Date  . CERVICAL DISC ARTHROPLASTY    . TONSILLECTOMY    . TUBAL LIGATION    Social history Lives with her husband and 2 children. Smoker Uses cocaine Has not had alcohol in 1 year Family History  Problem Relation Age of Onset  . Arthritis Mother   . Asthma Mother   . Diabetes Mother   . Hyperlipidemia Mother   . Vision loss Mother   . Alcohol abuse Father   . Asthma Father   . Diabetes Father   . Heart disease Father   . Depression Father   . COPD Father   . Cancer Father   . Hypertension Father   . Kidney disease Father   . Vision loss Father    Allergies  Allergen Reactions  . Acetaminophen     Other reaction(s): Other (qualifier value) Due to hep c  . Amoxicillin-Pot Clavulanate Diarrhea and Nausea And  Vomiting  . Gabapentin Hives  . Tetanus Toxoids   . Tramadol Hives  . Shellfish Allergy Rash    oysters   ? Current Facility-Administered Medications  Medication Dose Route Frequency Provider Last Rate Last Dose  . [START ON 07/06/2018] azithromycin (ZITHROMAX) 500 mg in sodium chloride 0.9 % 250 mL IVPB  500 mg Intravenous Q24H Bertram Savin, RPH      . bisacodyl (DULCOLAX) EC tablet 5 mg  5 mg Oral Daily PRN Barbaraann Rondo, MD      . cefTRIAXone (ROCEPHIN) 2 g in sodium chloride 0.9 % 100 mL IVPB  2 g Intravenous Q24H Ramesha Poster, MD      . citalopram (CELEXA) tablet 20 mg  20 mg Oral Daily Sridharan, Prasanna, MD      . enoxaparin (LOVENOX) injection 40 mg  40 mg Subcutaneous Q24H Bertram Savin, RPH      . [START ON 07/06/2018]  Influenza vac split quadrivalent PF (FLUARIX) injection 0.5 mL  0.5 mL Intramuscular Tomorrow-1000 Sridharan, Prasanna, MD      . ipratropium-albuterol (DUONEB) 0.5-2.5 (3) MG/3ML nebulizer solution 3 mL  3 mL Nebulization Q4H Barbaraann Rondo, MD   3 mL at 07/05/18 1258   Followed by  . [START ON 07/06/2018] ipratropium-albuterol (DUONEB) 0.5-2.5 (3) MG/3ML nebulizer solution 3 mL  3 mL Nebulization Q6H Barbaraann Rondo, MD       Followed by  . [START ON 07/07/2018] ipratropium-albuterol (DUONEB) 0.5-2.5 (3) MG/3ML nebulizer solution 3 mL  3 mL Nebulization Q4H PRN Barbaraann Rondo, MD      . levothyroxine (SYNTHROID, LEVOTHROID) tablet 25 mcg  25 mcg Oral QAC breakfast Marjie Skiff, Prasanna, MD      . methylPREDNISolone sodium succinate (SOLU-MEDROL) 125 mg/2 mL injection 60 mg  60 mg Intravenous Once Barbaraann Rondo, MD       Followed by  . [START ON 07/07/2018] predniSONE (DELTASONE) tablet 40 mg  40 mg Oral Q breakfast Barbaraann Rondo, MD      . mometasone-formoterol (DULERA) 200-5 MCG/ACT inhaler 2 puff  2 puff Inhalation BID Barbaraann Rondo, MD      . morphine 2 MG/ML injection 2 mg  2 mg Intravenous Q4H PRN Altamese Dilling, MD      . ondansetron (ZOFRAN) tablet 4 mg  4 mg Oral Q6H PRN Barbaraann Rondo, MD       Or  . ondansetron (ZOFRAN) injection 4 mg  4 mg Intravenous Q6H PRN Barbaraann Rondo, MD      . senna-docusate (Senokot-S) tablet 1 tablet  1 tablet Oral QHS PRN Barbaraann Rondo, MD         Abtx:  Anti-infectives (From admission, onward)   Start     Dose/Rate Route Frequency Ordered Stop   07/06/18 1000  levofloxacin (LEVAQUIN) IVPB 750 mg  Status:  Discontinued     750 mg 100 mL/hr over 90 Minutes Intravenous Every 24 hours 07/05/18 0545 07/05/18 1234   07/06/18 1000  azithromycin (ZITHROMAX) 500 mg in sodium chloride 0.9 % 250 mL IVPB     500 mg 250 mL/hr over 60 Minutes Intravenous Every 24 hours 07/05/18 1436     07/06/18 0600   azithromycin (ZITHROMAX) 250 mg in dextrose 5 % 125 mL IVPB  Status:  Discontinued     250 mg 125 mL/hr over 60 Minutes Intravenous Every 24 hours 07/05/18 1234 07/05/18 1329   07/06/18 0600  azithromycin (ZITHROMAX) 500 mg in sodium chloride 0.9 % 250 mL IVPB  Status:  Discontinued  500 mg 250 mL/hr over 60 Minutes Intravenous Every 24 hours 07/05/18 1329 07/05/18 1436   07/05/18 1800  cefTRIAXone (ROCEPHIN) 1 g in sodium chloride 0.9 % 100 mL IVPB  Status:  Discontinued     1 g 200 mL/hr over 30 Minutes Intravenous Every 24 hours 07/05/18 1259 07/05/18 1329   07/05/18 1800  cefTRIAXone (ROCEPHIN) 2 g in sodium chloride 0.9 % 100 mL IVPB     2 g 200 mL/hr over 30 Minutes Intravenous Every 24 hours 07/05/18 1329     07/05/18 1400  ceFEPIme (MAXIPIME) 2 g in sodium chloride 0.9 % 100 mL IVPB  Status:  Discontinued     2 g 200 mL/hr over 30 Minutes Intravenous Every 8 hours 07/05/18 1256 07/05/18 1302   07/05/18 1200  vancomycin (VANCOCIN) 1,250 mg in sodium chloride 0.9 % 250 mL IVPB  Status:  Discontinued     1,250 mg 166.7 mL/hr over 90 Minutes Intravenous Every 12 hours 07/05/18 0543 07/05/18 1321   07/05/18 0445  levofloxacin (LEVAQUIN) IVPB 750 mg     750 mg 100 mL/hr over 90 Minutes Intravenous  Once 07/05/18 0432 07/05/18 0855   07/05/18 0445  vancomycin (VANCOCIN) injection 1 g  Status:  Discontinued     1 g Intravenous  Once 07/05/18 0432 07/05/18 0444   07/05/18 0445  vancomycin (VANCOCIN) IVPB 1000 mg/200 mL premix     1,000 mg 200 mL/hr over 60 Minutes Intravenous  Once 07/05/18 0444 07/05/18 7078      REVIEW OF SYSTEMS:  Const: fever,  chills, negative weight loss Eyes: negative diplopia or visual changes, negative eye pain ENT: negative coryza, negative sore throat Resp:  cough, hemoptysis, dyspnea Cards: negative for chest pain, palpitations, lower extremity edema GU: Difficulty in passing urine GI: Negative for abdominal pain, diarrhea, bleeding,  constipation Skin: negative for rash and pruritus Heme: negative for easy bruising and gum/nose bleeding MS: negative for myalgias, arthralgias, has chronic back pain  No muscle weakness Neurolo:negative for headaches, dizziness, vertigo, memory problems  Psych: History of anxiety Endocrine: No polyuria or polydipsia Allergy/Immunology-Augmentin only causes GI symptoms Objective:  VITALS:  BP 131/83 (BP Location: Left Arm)   Pulse (!) 102   Temp 98.3 F (36.8 C) (Oral)   Resp 19   Ht 5\' 4"  (1.626 m)   Wt 81.6 kg   LMP 07/05/2018   SpO2 93%   BMI 30.90 kg/m  PHYSICAL EXAM:  General: Alert, cooperative, no distress, appears stated age.  Head: Normocephalic, without obvious abnormality, atraumatic. Eyes: Conjunctivae clear, anicteric sclerae. Pupils are equal ENT Nares normal. No drainage or sinus tenderness. Lips, mucosa, and tongue normal. No Thrush Neck: Supple, symmetrical, no adenopathy, thyroid: non tender no carotid bruit and no JVD. Back: No CVA tenderness. Lungs: Bilateral air entry.  Bilateral rhonchi Heart: Regular rate and rhythm, no murmur, rub or gallop. Abdomen: Soft, non-tender,not distended. Bowel sounds normal. No masses Extremities: atraumatic, no cyanosis. No edema. No clubbing Skin: No rashes or lesions. Or bruising Lymph: Cervical, supraclavicular normal. Neurologic: Grossly non-focal Pertinent Labs Lab Results CBC    Component Value Date/Time   WBC 17.4 (H) 07/05/2018 0436   RBC 4.01 07/05/2018 0436   HGB 12.6 07/05/2018 0436   HGB 13.0 10/15/2014 1205   HCT 37.9 07/05/2018 0436   HCT 39.1 10/15/2014 1205   PLT 290 07/05/2018 0436   PLT 175 10/15/2014 1205   MCV 94.5 07/05/2018 0436   MCV 98 10/15/2014 1205   MCH 31.4  07/05/2018 0436   MCHC 33.2 07/05/2018 0436   RDW 13.1 07/05/2018 0436   RDW 13.0 10/15/2014 1205   LYMPHSABS 1.2 07/05/2018 0436   LYMPHSABS 3.0 10/15/2014 1205   MONOABS 0.6 07/05/2018 0436   MONOABS 0.7 10/15/2014 1205    EOSABS 0.0 07/05/2018 0436   EOSABS 0.2 10/15/2014 1205   BASOSABS 0.0 07/05/2018 0436   BASOSABS 0.1 10/15/2014 1205    CMP Latest Ref Rng & Units 07/05/2018 05/13/2018 04/09/2018  Glucose 70 - 99 mg/dL 098(J134(H) 191(Y118(H) 85  BUN 6 - 20 mg/dL 9 14 78(G21(H)  Creatinine 0.44 - 1.00 mg/dL 9.560.71 2.130.62 0.860.74  Sodium 135 - 145 mmol/L 142 142 141  Potassium 3.5 - 5.1 mmol/L 4.1 4.1 3.7  Chloride 98 - 111 mmol/L 104 104 103  CO2 22 - 32 mmol/L 28 28 30   Calcium 8.9 - 10.3 mg/dL 5.7(Q8.8(L) 9.2 4.6(N8.7(L)  Total Protein 6.5 - 8.1 g/dL 7.7 7.9 -  Total Bilirubin 0.3 - 1.2 mg/dL 0.4 0.6 -  Alkaline Phos 38 - 126 U/L 61 57 -  AST 15 - 41 U/L 18 14(L) -  ALT 0 - 44 U/L 17 12 -      Microbiology: Recent Results (from the past 240 hour(s))  Group A Strep by PCR     Status: None   Collection Time: 07/05/18  3:37 AM  Result Value Ref Range Status   Group A Strep by PCR NOT DETECTED NOT DETECTED Final    Comment: Performed at [facility], [address] [place], [place] [address]  MRSA PCR Screening     Status: None   Collection Time: 07/05/18  6:42 AM  Result Value Ref Range Status   MRSA by PCR NEGATIVE NEGATIVE Final    Comment:        The GeneXpert MRSA Assay (FDA approved for NASAL specimens only), is one component of a comprehensive MRSA colonization surveillance program. It is not intended to diagnose MRSA infection nor to guide or monitor treatment for MRSA infections. Performed at [facility], [address] [place], [place] [address]    IMAGING RESULTS: ? Impression/Recommendation 32 y.o. female with a history of hepatitis C, COPD, history of polysubstance use presents with cough of few weeks duration.  Patient states she has had intermittent cough for almost 6 months but it has gotten worse in the last few weeks.  [facility] goes to [facility] and she got doxycycline which she has been taking it for the past [redacted] weeks along with prednisone 60 mg p.o. twice daily.   Not much of  relief from both.  She says she has subjective fever. She coughs up green sputum sometimes and occasionally it is blood-tinged.  The past 2 weeks she has shortness of breath while lying in bed and feels like she is drowning. ? ? ?Bilateral pulmonary infiltrates suggestive bilateral pneumonia.  She has been on doxycycline and prednisone the past 2 weeks. D.D bacterial versus viral versus atypical organisms VS non infectious . Common organisms causing pneumonia are pneumococcus, Haemophilus influenza, Moraxella -usually responds to doxycycline. Need to rule out atypical organisms like Legionella and mycoplasma ( though they do respond to Doxy) Gram-negative pneumonia does not respond to doxycycline MRSA nares negative hence less likely to be MRSA pneumonia. May need to rule out viral pneumonia even though influenza is negative will check respiratory viral PCR for other organisms like RSV. As she has been on steroids will have to rule out P JP  or other organisms like nocardia or fungal. With snorting cocaine this could be a foreign body reaction in the lungs.  She does not use electronic cigarette as this picture can fit with evali  Septic emboli to the lungs from a tricuspid valve endocarditis is a remote possibility but she denies IV drug use and also there is no cavitation of these infiltrates.  Because of COPD and sinus issues in the past eosinophilic  granulomatous polyangiitis is a remote possibility but no peripheral eosinophilia.   In patients with hep C cryoglobulinemic vasculitis  can cause bilateral pulmonary infiltrates which are usually hemorrhage.  Unlikely in her case.  Will send sputum culture, beta D glucan, fungal antibodies, respiratory viral PCR, QuantiFERON gold. She is currently on ceftriaxone and azithromycin.  Gave a dose of Levaquin, vancomycin and cefepime  If no response to treatment may need further testing including bronch   COPD  Hepatitis C status  post treatment with Harvoni.   ___________________________________________________ Discussed with patient, requesting provider Note:  This document was prepared using Dragon voice recognition software and may include unintentional dictation errors.

## 2018-07-05 NOTE — ED Notes (Signed)
Patient transported to X-ray 

## 2018-07-05 NOTE — ED Notes (Signed)
Pt returned from xr at this time.

## 2018-07-05 NOTE — Progress Notes (Signed)
Pharmacy Antibiotic Note  Jennifer Vasquez is a 32 y.o. female admitted on 07/05/2018 with pneumonia.  Pharmacy has been consulted for vancomycin and Levaquin dosing.  Plan: DW 66kg  Vd 46 L kei 0.90hr-1  t1/2 8 hours Vancomycin 1250 mg q 12 hours ordered. Level before 5th dose. Goal trough 15-20  Levofloxacin 750 mg IV daily ordered.  Height: 5\' 4"  (162.6 cm) Weight: 180 lb (81.6 kg) IBW/kg (Calculated) : 54.7  Temp (24hrs), Avg:98.9 F (37.2 C), Min:98.9 F (37.2 C), Max:98.9 F (37.2 C)  Recent Labs  Lab 07/05/18 0436  WBC 17.4*  CREATININE 0.71    Estimated Creatinine Clearance: 102.5 mL/min (by C-G formula based on SCr of 0.71 mg/dL).    Allergies  Allergen Reactions  . Acetaminophen     Other reaction(s): Other (qualifier value) Due to hep c  . Amoxicillin-Pot Clavulanate Diarrhea and Nausea And Vomiting  . Gabapentin Hives  . Tetanus Toxoids   . Tramadol Hives  . Shellfish Allergy Rash    oysters    Antimicrobials this admission: Vancomycin, levofloxacin >>    >>   Dose adjustments this admission:   Microbiology results: 12/25 BCx: pending  12/25 MRSA PCR: pending      12/25 CXR: compatible with multifocal pneumonia  Thank you for allowing pharmacy to be a part of this patient's care.  Brittiny Levitz S 07/05/2018 5:45 AM

## 2018-07-05 NOTE — Progress Notes (Signed)
Pharmacy Antibiotic Note  Jennifer Vasquez is a 32 y.o. female admitted on 07/05/2018 with pneumonia.  Pharmacy has been consulted for cefepime dosing. Pt with recurrent PNA per note; ID consult ordered.   Plan: Cefepime 2 g IV q8h  Height: 5\' 4"  (162.6 cm) Weight: 180 lb (81.6 kg) IBW/kg (Calculated) : 54.7  Temp (24hrs), Avg:98.6 F (37 C), Min:98.3 F (36.8 C), Max:98.9 F (37.2 C)  Recent Labs  Lab 07/05/18 0436 07/05/18 0718  WBC 17.4*  --   CREATININE 0.71  --   LATICACIDVEN 2.5* 2.1*    Estimated Creatinine Clearance: 102.5 mL/min (by C-G formula based on SCr of 0.71 mg/dL).    Allergies  Allergen Reactions  . Acetaminophen     Other reaction(s): Other (qualifier value) Due to hep c  . Amoxicillin-Pot Clavulanate Diarrhea and Nausea And Vomiting  . Gabapentin Hives  . Tetanus Toxoids   . Tramadol Hives  . Shellfish Allergy Rash    oysters     Thank you for allowing pharmacy to be a part of this patient's care.  07/07/18 07/05/2018 12:52 PM

## 2018-07-06 ENCOUNTER — Inpatient Hospital Stay: Payer: Self-pay

## 2018-07-06 DIAGNOSIS — J984 Other disorders of lung: Secondary | ICD-10-CM

## 2018-07-06 DIAGNOSIS — T405X1A Poisoning by cocaine, accidental (unintentional), initial encounter: Secondary | ICD-10-CM

## 2018-07-06 DIAGNOSIS — F1990 Other psychoactive substance use, unspecified, uncomplicated: Secondary | ICD-10-CM

## 2018-07-06 LAB — RESPIRATORY PANEL BY PCR
Adenovirus: NOT DETECTED
Bordetella pertussis: NOT DETECTED
Chlamydophila pneumoniae: NOT DETECTED
Coronavirus 229E: NOT DETECTED
Coronavirus HKU1: NOT DETECTED
Coronavirus NL63: NOT DETECTED
Coronavirus OC43: NOT DETECTED
Influenza A: NOT DETECTED
Influenza B: NOT DETECTED
Metapneumovirus: NOT DETECTED
Mycoplasma pneumoniae: NOT DETECTED
Parainfluenza Virus 1: NOT DETECTED
Parainfluenza Virus 2: NOT DETECTED
Parainfluenza Virus 3: NOT DETECTED
Parainfluenza Virus 4: NOT DETECTED
RESPIRATORY SYNCYTIAL VIRUS-RVPPCR: NOT DETECTED
RHINOVIRUS / ENTEROVIRUS - RVPPCR: NOT DETECTED

## 2018-07-06 LAB — CBC
HCT: 35.5 % — ABNORMAL LOW (ref 36.0–46.0)
Hemoglobin: 11.3 g/dL — ABNORMAL LOW (ref 12.0–15.0)
MCH: 31.7 pg (ref 26.0–34.0)
MCHC: 31.8 g/dL (ref 30.0–36.0)
MCV: 99.7 fL (ref 80.0–100.0)
Platelets: 262 10*3/uL (ref 150–400)
RBC: 3.56 MIL/uL — ABNORMAL LOW (ref 3.87–5.11)
RDW: 13.4 % (ref 11.5–15.5)
WBC: 20 10*3/uL — ABNORMAL HIGH (ref 4.0–10.5)
nRBC: 0.1 % (ref 0.0–0.2)

## 2018-07-06 LAB — PROCALCITONIN: Procalcitonin: 0.42 ng/mL

## 2018-07-06 MED ORDER — BENZONATATE 100 MG PO CAPS
200.0000 mg | ORAL_CAPSULE | Freq: Three times a day (TID) | ORAL | Status: DC | PRN
  Administered 2018-07-06 – 2018-07-07 (×4): 200 mg via ORAL
  Filled 2018-07-06 (×4): qty 2

## 2018-07-06 NOTE — Progress Notes (Addendum)
[employer] [facility] [facility] [place] [facility]   PATIENT NAME: Jennifer Vasquez    MR#:  [id number]  DATE OF BIRTH:  05-29-1986  SUBJECTIVE:   Patient endorses continued cough and shortness of breath.  She states that her ribs and abdomen are sore from coughing.  REVIEW OF SYSTEMS:  CONSTITUTIONAL: No fever, fatigue or weakness.  EYES: No blurred or double vision.  EARS, NOSE, AND THROAT: No tinnitus or ear pain.  RESPIRATORY: +cough, shortness of breath, wheezing, no hemoptysis.  CARDIOVASCULAR: No chest pain, orthopnea, edema.  GASTROINTESTINAL: No nausea, vomiting, diarrhea or abdominal pain.  GENITOURINARY: No dysuria, hematuria.  ENDOCRINE: No polyuria, nocturia,  HEMATOLOGY: No anemia, easy bruising or bleeding SKIN: No rash or lesion. MUSCULOSKELETAL: No joint pain or arthritis.   NEUROLOGIC: No tingling, numbness, weakness.  PSYCHIATRY: No anxiety or depression.    DRUG ALLERGIES:   Allergies  Allergen Reactions  . Acetaminophen     Other reaction(s): Other (qualifier value) Due to hep c  . Amoxicillin-Pot Clavulanate Diarrhea and Nausea And Vomiting  . Gabapentin Hives  . Tetanus Toxoids   . Tramadol Hives  . Shellfish Allergy Rash    oysters    VITALS:  Blood pressure 133/82, pulse 83, temperature 98 F (36.7 C), temperature source Oral, resp. rate 20, height 5\' 4"  (1.626 m), weight 81.6 kg, last menstrual period 07/05/2018, SpO2 95 %.  PHYSICAL EXAMINATION:  GENERAL:  32 y.o.-year-old patient lying in the bed with no acute distress.  EYES: Pupils equal, round, reactive to light and accommodation. No scleral icterus. Extraocular muscles intact.  HEENT: Head atraumatic, normocephalic. Oropharynx and nasopharynx clear.  Moist mucous membranes. NECK:  Supple, no jugular venous distention. No thyroid enlargement, no tenderness.  LUNGS: + Coarse breath sounds bilaterally, occasional inspiratory and expiratory wheezing. No use of accessory muscles of  respiration.  CARDIOVASCULAR: RRR, S1, S2 normal. No murmurs, rubs, or gallops.  ABDOMEN: Soft, nontender, nondistended. Bowel sounds present. No organomegaly or mass.  EXTREMITIES: No pedal edema, cyanosis, or clubbing.  NEUROLOGIC: Cranial nerves II through XII are intact. Muscle strength 5/5 in all extremities. Sensation intact. Gait not checked.  PSYCHIATRIC: The patient is alert and oriented x 3.  SKIN: No obvious rash, lesion, or ulcer.   LABORATORY PANEL:   CBC Recent Labs  Lab 07/06/18 0417  WBC 20.0*  HGB 11.3*  HCT 35.5*  PLT 262   ------------------------------------------------------------------------------------------------------------------  Chemistries  Recent Labs  Lab 07/05/18 0436  NA 142  K 4.1  CL 104  CO2 28  GLUCOSE 134*  BUN 9  CREATININE 0.71  CALCIUM 8.8*  MG 2.2  AST 18  ALT 17  ALKPHOS 61  BILITOT 0.4   ------------------------------------------------------------------------------------------------------------------  Cardiac Enzymes Recent Labs  Lab 07/05/18 0436  TROPONINI <0.03   ------------------------------------------------------------------------------------------------------------------  RADIOLOGY:  Dg Chest 2 View  Result Date: 07/06/2018 CLINICAL DATA:  Hypoxia. Shortness of breath. Dry cough for 6 months. EXAM: CHEST - 2 VIEW COMPARISON:  Radiographs and CT 07/05/2018. FINDINGS: The heart size and mediastinal contours are stable. The patchy airspace and ground-glass opacities in both lungs have improved since yesterday, especially on the right. There is no pneumothorax or significant pleural effusion. Postsurgical changes are noted status post lower cervical fusion. IMPRESSION: Interval improvement in patchy bilateral airspace and ground-glass opacities consistent with resolving inflammation or edema. Electronically Signed   By: 07/07/2018 M.D.   On: 07/06/2018 08:12   Dg Chest 2 View  Result Date:  07/05/2018 CLINICAL DATA:  Subacute onset of sore throat, cough and nasal congestion. EXAM: CHEST - 2 VIEW COMPARISON:  Chest radiograph performed 05/13/2018 FINDINGS: The lungs are well-aerated. Patchy bilateral airspace opacification is compatible with multifocal pneumonia. There is no evidence of pleural effusion or pneumothorax. The heart is borderline normal in size. Cervical spinal fusion hardware is noted. No acute osseous abnormalities are seen. IMPRESSION: Patchy bilateral airspace opacification is compatible with multifocal pneumonia. Followup PA and lateral chest X-ray is recommended in 3-4 weeks following trial of antibiotic therapy to ensure resolution and exclude underlying malignancy. Electronically Signed   By: Roanna Raider M.D.   On: 07/05/2018 03:45   Ct Chest W Contrast  Result Date: 07/05/2018 CLINICAL DATA:  Pneumonia, cough, fever, chills, generalized myalgias, shortness of breath, sore throat and fatigue for 3 weeks with worsened symptoms over past few days, history asthma, CHF, COPD, smoker EXAM: CT CHEST WITH CONTRAST TECHNIQUE: Multidetector CT imaging of the chest was performed during intravenous contrast administration. Sagittal and coronal MPR images reconstructed from axial data set. CONTRAST:  45mL OMNIPAQUE IOHEXOL 300 MG/ML  SOLN COMPARISON:  None FINDINGS: Cardiovascular: Aorta normal caliber. No pericardial effusion. Thoracic vascular structures grossly unremarkable for technique. Mediastinum/Nodes: Esophagus normal appearance. Small parenchymal calcification medial to the LEFT thyroid lobe, nonspecific. Base of cervical region otherwise normal appearance. No thoracic adenopathy. Lungs/Pleura: Patchy BILATERAL airspace infiltrates consistent with pneumonia. No pleural effusion or pneumothorax. No definite pulmonary mass or nodule. Upper Abdomen: Beam hardening artifacts traverse liver. Visualized upper abdomen otherwise unremarkable. Musculoskeletal: Prior cervical spine  fusion. No acute thoracic osseous abnormalities. IMPRESSION: Patchy BILATERAL airspace infiltrates diffusely throughout both lungs consistent with pneumonia. Electronically Signed   By: Ulyses Southward M.D.   On: 07/05/2018 14:54    ASSESSMENT AND PLAN:   Acute hypoxic respiratory failure- secondary to multilobar pneumonia and COPD exacerbation versus reaction to cocaine use..  On room air. -Pulmonology consult -Continue IV steroids and nebulizers as needed  Sepsis secondary to pneumonia- sepsis has resolved. Leukocytosis worsened from yesterday to today. PCT elevated. -Continue antibiotics -Follow-up cultures -Strep pneumo negative -ID consulted- sputum culture, beta D glucan, fungal antibodies, Quantiferon gold sent  COPD exacerbation- improved, on room air -Continue steroids  History of hepatitis C- s/p treatment  Polysubstance abuse -Cessation counseling provided this admission  All the records are reviewed and case discussed with Care Management/Social Workerr. Management plans discussed with the patient, family and they are in agreement.  CODE STATUS: Full code.  TOTAL TIME TAKING CARE OF THIS PATIENT: 35 minutes.    POSSIBLE D/C IN 1-2 DAYS, DEPENDING ON CLINICAL CONDITION.   Jinny Blossom Logyn Dedominicis M.D on 07/06/2018   Between 7am to 6pm - Pager - [phone]  After 6pm go to [email or web address] - Social research officer, government  [facility]  Office  [phone]  CC: Primary care physician; Center, [facility]  Note: This dictation was prepared with Nurse, children's dictation along with smaller phrase technology. Any transcriptional errors that result from this process are unintentional.

## 2018-07-06 NOTE — Consult Note (Signed)
Reason for Consult: Bilateral pulmonary infiltrates. Referring Physician: Campbell Stall, MD  Jennifer Vasquez is an 32 y.o. female.  HPI:   Jennifer Vasquez is a 32 year old very recent former smoker, who presented to [facility] yesterday with a complaint of of cough and shortness of breath.  Of note is that she has had multiple hospitalizations in September for the same complaint.  Patient has been feeling poorly for the last 6 weeks.  She has had a nonproductive cough with progressive shortness of breath.  Currently she states she feels better after receiving fluids antibiotics and steroids.  She has a history of asthma with COPD overlap syndrome, and is on inhalers for the same.  The patient states that she had subjective fevers but when pressed does not endorse any other symptoms.  She did present appearing acutely ill and with a systemic inflammatory response type syndrome however all the symptoms have quickly subsided.  This does not report acute infection.  The patient does use cocaine and states that she is unsure whether the cocaine she uses may be "cut".  She has been using up until admission.  Her drug screen is positive for cocaine.  She had a CT scan of the chest that shows bilateral patchy infiltrates consistent with pneumonitis, however, they are also consistent with cocaine/"crack" lung.  Past Medical History:  Diagnosis Date  . Acute carpal tunnel syndrome 10/10/2014  . Anxiety state 09/03/2009  . Asthma   . Bipolar disorder (HCC) 02/09/2013  . CHF (congestive heart failure) (HCC)   . COPD (chronic obstructive pulmonary disease) (HCC)   . DDD (degenerative disc disease), cervical 11/13/2014  . DDD (degenerative disc disease), lumbar 11/13/2014  . Hepatitis C   . Lumbosacral radiculopathy 11/13/2014  . Tobacco use disorder 09/06/2012    Past Surgical History:  Procedure Laterality Date  . CERVICAL DISC ARTHROPLASTY    . TONSILLECTOMY    . TUBAL LIGATION      Family History  Problem  Relation Age of Onset  . Arthritis Mother   . Asthma Mother   . Diabetes Mother   . Hyperlipidemia Mother   . Vision loss Mother   . Alcohol abuse Father   . Asthma Father   . Diabetes Father   . Heart disease Father   . Depression Father   . COPD Father   . Cancer Father   . Hypertension Father   . Kidney disease Father   . 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  Lives with her husband and 2 children.  Smoker until recently but was smoking 1/2 pack of cigarettes per day.  She is somewhat evasive about her history.  She has not had alcohol in approximately 1 year.  She uses cocaine regularly.   Allergies:  Allergies  Allergen Reactions  . Acetaminophen     Other reaction(s): Other (qualifier value) Due to hep c  . Amoxicillin-Pot Clavulanate Diarrhea and Nausea And Vomiting  . Gabapentin Hives  . Tetanus Toxoids   . Tramadol Hives  . Shellfish Allergy Rash    oysters    Medications: I have reviewed the patient's current medications.  Results for orders placed or performed during the hospital encounter of 07/05/18 (from the past 48 hour(s))  Group A Strep by PCR     Status: None   Collection Time: 07/05/18  3:37 AM  Result Value Ref Range   Group A Strep  by PCR NOT DETECTED NOT DETECTED    Comment: Performed at [facility], [address] [place], [place] [address]  Influenza panel by PCR (type A & B)     Status: None   Collection Time: 07/05/18  3:52 AM  Result Value Ref Range   Influenza A By PCR NEGATIVE NEGATIVE   Influenza B By PCR NEGATIVE NEGATIVE    Comment: (NOTE) The Xpert Xpress Flu assay is intended as an aid in the diagnosis of  influenza and should not be used as a sole basis for treatment.  This  assay is FDA approved for nasopharyngeal swab specimens only. Nasal  washings and aspirates are unacceptable for Xpert Xpress Flu testing. Performed at [facility], [address] [place], [place] [address]   Lactic acid, plasma     Status: Abnormal   Collection Time: 07/05/18  4:36 AM  Result Value Ref Range   Lactic Acid, Venous 2.5 (HH) 0.5 - 1.9 mmol/L    Comment: CRITICAL RESULT CALLED TO, READ BACK BY AND VERIFIED WITH ALLISON Fayson AT 3419 07/05/18 DAS Performed at [facility], [address] [place], [place] [address]   Comprehensive metabolic panel     Status: Abnormal   Collection Time: 07/05/18  4:36 AM  Result Value Ref Range   Sodium 142 135 - 145 mmol/L   Potassium 4.1 3.5 - 5.1 mmol/L   Chloride 104 98 - 111 mmol/L   CO2 28 22 - 32 mmol/L   Glucose, Bld 134 (H) 70 - 99 mg/dL   BUN 9 6 - 20 mg/dL   Creatinine, Ser 4.09 0.44 - 1.00 mg/dL   Calcium 8.8 (L) 8.9 - 10.3 mg/dL   Total Protein 7.7 6.5 - 8.1 g/dL   Albumin 4.0 3.5 - 5.0 g/dL   AST 18 15 - 41 U/L   ALT 17 0 - 44 U/L   Alkaline Phosphatase 61 38 - 126 U/L   Total Bilirubin 0.4 0.3 - 1.2 mg/dL   GFR calc non Af Amer >60 >60 mL/min   GFR calc Af Amer >60 >60 mL/min   Anion gap 10 5 - 15    Comment: Performed at [facility] [address] [place], [place] [address]  Lipase, blood     Status: None   Collection Time: 07/05/18  4:36 AM  Result Value Ref Range   Lipase 27 11 - 51 U/L    Comment: Performed at [facility], [address] [place], [place] [address]  Brain natriuretic peptide - IF patient is dyspneic     Status: Abnormal   Collection Time: 07/05/18  4:36 AM  Result Value Ref Range   B Natriuretic Peptide 479.0 (H) 0.0 - 100.0 pg/mL    Comment: Performed at [facility], [address] [place], [place] [address]  Troponin I - ONCE - STAT     Status: None   Collection Time: 07/05/18  4:36 AM  Result Value Ref Range   Troponin I <0.03 <0.03 ng/mL    Comment: Performed at [facility], [address] [place], [place] [address]  CBC WITH DIFFERENTIAL     Status: Abnormal   Collection Time: 07/05/18   4:36 AM  Result Value Ref Range   WBC 17.4 (H) 4.0 - 10.5 K/uL   RBC 4.01 3.87 - 5.11 MIL/uL   Hemoglobin 12.6 12.0 - 15.0 g/dL   HCT 97.9 89.2 - 11.9 %   MCV 94.5 80.0 -  100.0 fL   MCH 31.4 26.0 - 34.0 pg   MCHC 33.2 30.0 - 36.0 g/dL   RDW 16.1 09.6 - 04.5 %   Platelets 290 150 - 400 K/uL   nRBC 0.0 0.0 - 0.2 %   Neutrophils Relative % 88 %   Neutro Abs 15.3 (H) 1.7 - 7.7 K/uL   Lymphocytes Relative 7 %   Lymphs Abs 1.2 0.7 - 4.0 K/uL   Monocytes Relative 3 %   Monocytes Absolute 0.6 0.1 - 1.0 K/uL   Eosinophils Relative 0 %   Eosinophils Absolute 0.0 0.0 - 0.5 K/uL   Basophils Relative 0 %   Basophils Absolute 0.0 0.0 - 0.1 K/uL   Immature Granulocytes 2 %   Abs Immature Granulocytes 0.27 (H) 0.00 - 0.07 K/uL    Comment: Performed at [facility], [address] [place], [place] [address]  Procalcitonin     Status: None   Collection Time: 07/05/18  4:36 AM  Result Value Ref Range   Procalcitonin 0.88 ng/mL    Comment:        Interpretation: PCT > 0.5 ng/mL and <= 2 ng/mL: Systemic infection (sepsis) is possible, but other conditions are known to elevate PCT as well. (NOTE)       Sepsis PCT Algorithm           Lower Respiratory Tract                                      Infection PCT Algorithm    ----------------------------     ----------------------------         PCT < 0.25 ng/mL                PCT < 0.10 ng/mL         Strongly encourage             Strongly discourage   discontinuation of antibiotics    initiation of antibiotics    ----------------------------     -----------------------------       PCT 0.25 - 0.50 ng/mL            PCT 0.10 - 0.25 ng/mL               OR       >80% decrease in PCT            Discourage initiation of                                            antibiotics      Encourage discontinuation           of antibiotics    ----------------------------     -----------------------------         PCT >= 0.50 ng/mL              PCT 0.26  - 0.50 ng/mL                AND       <80% decrease in PCT             Encourage initiation of  antibiotics       Encourage continuation           of antibiotics    ----------------------------     -----------------------------        PCT >= 0.50 ng/mL                  PCT > 0.50 ng/mL               AND         increase in PCT                  Strongly encourage                                      initiation of antibiotics    Strongly encourage escalation           of antibiotics                                     -----------------------------                                           PCT <= 0.25 ng/mL                                                 OR                                        > 80% decrease in PCT                                     Discontinue / Do not initiate                                             antibiotics Performed at [facility], [address] [place], [place] [address]   Protime-INR     Status: None   Collection Time: 07/05/18  4:36 AM  Result Value Ref Range   Prothrombin Time 13.3 11.4 - 15.2 seconds   INR 1.02     Comment: Performed at [facility], [address] [place], [place] [address]  Magnesium     Status: None   Collection Time: 07/05/18  4:36 AM  Result Value Ref Range   Magnesium 2.2 1.7 - 2.4 mg/dL    Comment: Performed at [facility], [address], [place], [place] [address]  Phosphorus     Status: None   Collection Time: 07/05/18  4:36 AM  Result Value Ref Range   Phosphorus 4.4 2.5 - 4.6 mg/dL    Comment: Performed at [facility], [address] [place], [place] [address]  Blood Culture (routine x 2)     Status: None (Preliminary result)   Collection Time: 07/05/18  4:49 AM  Result Value Ref Range   Specimen Description BLOOD RIGHT ANTECUBITAL    Special Requests      BOTTLES DRAWN AEROBIC AND ANAEROBIC Blood Culture adequate volume    Culture      NO GROWTH 1 DAY Performed at [facility], [address], [place], [place] [address]    Report Status PENDING   Blood Culture (routine x 2)     Status: None (Preliminary result)   Collection Time: 07/05/18  4:49 AM  Result Value Ref Range   Specimen Description BLOOD LEFT HAND    Special Requests      BOTTLES DRAWN AEROBIC AND ANAEROBIC Blood Culture adequate volume   Culture      NO GROWTH 1 DAY Performed at [facility], [address] [place], [place] [address]    Report Status PENDING   Urinalysis, Complete w Microscopic     Status: Abnormal   Collection Time: 07/05/18  6:41 AM  Result Value Ref Range   Color, Urine YELLOW (A) YELLOW   APPearance CLEAR (A) CLEAR   Specific Gravity, Urine 1.005 1.005 - 1.030   pH 7.0 5.0 - 8.0   Glucose, UA NEGATIVE NEGATIVE mg/dL   Hgb urine dipstick LARGE (A) NEGATIVE   Bilirubin Urine NEGATIVE NEGATIVE   Ketones, ur NEGATIVE NEGATIVE mg/dL   Protein, ur 30 (A) NEGATIVE mg/dL   Nitrite NEGATIVE NEGATIVE   Leukocytes, UA MODERATE (A) NEGATIVE   RBC / HPF 11-20 0 - 5 RBC/hpf   WBC, UA 0-5 0 - 5 WBC/hpf   Bacteria, UA NONE SEEN NONE SEEN   Squamous Epithelial / LPF 0-5 0 - 5   Mucus PRESENT     Comment: Performed at [facility], [address] [place], [place] [address]  MRSA PCR Screening     Status: None   Collection Time: 07/05/18  6:42 AM  Result Value Ref Range   MRSA by PCR NEGATIVE NEGATIVE    Comment:        The GeneXpert MRSA Assay (FDA approved for NASAL specimens only), is one component of a comprehensive MRSA colonization surveillance program. It is not intended to diagnose MRSA infection nor to guide or monitor treatment for MRSA infections. Performed at [facility], [address] [place], [place] [address]   Lactic acid, plasma     Status: Abnormal   Collection Time: 07/05/18  7:18 AM  Result Value Ref Range   Lactic Acid, Venous 2.1 (HH) 0.5 - 1.9  mmol/L    Comment: CRITICAL RESULT CALLED TO, READ BACK BY AND VERIFIED WITH SHAYONE SMITH @0754  ON 07/05/18 BY HKP Performed at [facility], [address] [place], [place] [address]   Strep pneumoniae urinary antigen     Status: None   Collection Time: 07/05/18  1:20 PM  Result Value Ref Range   Strep Pneumo Urinary Antigen NEGATIVE NEGATIVE    Comment:        Infection due to S. pneumoniae cannot be absolutely ruled out since the antigen present may be below the detection limit of the test. Performed at [facility], [address], [place], [place] [address]   Urine Drug Screen, Qualitative [facility] only)     Status: Abnormal   Collection Time: 07/05/18  1:20 PM  Result Value Ref Range   Tricyclic, Ur Screen POSITIVE (A) NONE DETECTED   Amphetamines, Ur Screen NONE DETECTED NONE DETECTED   MDMA (Ecstasy)Ur Screen NONE DETECTED NONE  DETECTED   Cocaine Metabolite,Ur [place] POSITIVE (A) NONE DETECTED   Opiate, Ur Screen POSITIVE (A) NONE DETECTED   Phencyclidine (PCP) Ur S NONE DETECTED NONE DETECTED   Cannabinoid 50 Ng, Ur [facility] NONE DETECTED NONE DETECTED   Barbiturates, Ur Screen NONE DETECTED NONE DETECTED   Benzodiazepine, Ur Scrn NONE DETECTED NONE DETECTED   Methadone Scn, Ur POSITIVE (A) NONE DETECTED    Comment: (NOTE) Tricyclics + metabolites, urine    Cutoff 1000 ng/mL Amphetamines + metabolites, urine  Cutoff 1000 ng/mL MDMA (Ecstasy), urine              Cutoff 500 ng/mL Cocaine Metabolite, urine          Cutoff 300 ng/mL Opiate + metabolites, urine        Cutoff 300 ng/mL Phencyclidine (PCP), urine         Cutoff 25 ng/mL Cannabinoid, urine                 Cutoff 50 ng/mL Barbiturates + metabolites, urine  Cutoff 200 ng/mL Benzodiazepine, urine              Cutoff 200 ng/mL Methadone, urine                   Cutoff 300 ng/mL The urine drug screen provides only a preliminary, unconfirmed analytical test result and should not be used for  non-medical purposes. Clinical consideration and professional judgment should be applied to any positive drug screen result due to possible interfering substances. A more specific alternate chemical method must be used in order to obtain a confirmed analytical result. Gas chromatography / mass spectrometry (GC/MS) is the preferred confirmat ory method. Performed at [facility], [address] [place], [place] [address]   Lactate dehydrogenase     Status: Abnormal   Collection Time: 07/05/18  1:45 PM  Result Value Ref Range   LDH 225 (H) 98 - 192 U/L    Comment: Performed at [facility], [address] [place], [place] [address]  Respiratory Panel by PCR     Status: None   Collection Time: 07/05/18  4:46 PM  Result Value Ref Range   Adenovirus NOT DETECTED NOT DETECTED   Coronavirus 229E NOT DETECTED NOT DETECTED   Coronavirus HKU1 NOT DETECTED NOT DETECTED   Coronavirus NL63 NOT DETECTED NOT DETECTED   Coronavirus OC43 NOT DETECTED NOT DETECTED   Metapneumovirus NOT DETECTED NOT DETECTED   Rhinovirus / Enterovirus NOT DETECTED NOT DETECTED   Influenza A NOT DETECTED NOT DETECTED   Influenza B NOT DETECTED NOT DETECTED   Parainfluenza Virus 1 NOT DETECTED NOT DETECTED   Parainfluenza Virus 2 NOT DETECTED NOT DETECTED   Parainfluenza Virus 3 NOT DETECTED NOT DETECTED   Parainfluenza Virus 4 NOT DETECTED NOT DETECTED   Respiratory Syncytial Virus NOT DETECTED NOT DETECTED   Bordetella pertussis NOT DETECTED NOT DETECTED   Chlamydophila pneumoniae NOT DETECTED NOT DETECTED   Mycoplasma pneumoniae NOT DETECTED NOT DETECTED    Comment: Performed at [facility], [address], [place], [place] [address]  Procalcitonin     Status: None   Collection Time: 07/06/18  4:17 AM  Result Value Ref Range   Procalcitonin 0.42 ng/mL    Comment:        Interpretation: PCT (Procalcitonin) <= 0.5 ng/mL: Systemic infection (sepsis) is not likely. Local bacterial  infection is possible. (NOTE)       Sepsis PCT Algorithm  Lower Respiratory Tract                                      Infection PCT Algorithm    ----------------------------     ----------------------------         PCT < 0.25 ng/mL                PCT < 0.10 ng/mL         Strongly encourage             Strongly discourage   discontinuation of antibiotics    initiation of antibiotics    ----------------------------     -----------------------------       PCT 0.25 - 0.50 ng/mL            PCT 0.10 - 0.25 ng/mL               OR       >80% decrease in PCT            Discourage initiation of                                            antibiotics      Encourage discontinuation           of antibiotics    ----------------------------     -----------------------------         PCT >= 0.50 ng/mL              PCT 0.26 - 0.50 ng/mL               AND        <80% decrease in PCT             Encourage initiation of                                             antibiotics       Encourage continuation           of antibiotics    ----------------------------     -----------------------------        PCT >= 0.50 ng/mL                  PCT > 0.50 ng/mL               AND         increase in PCT                  Strongly encourage                                      initiation of antibiotics    Strongly encourage escalation           of antibiotics                                     -----------------------------  PCT <= 0.25 ng/mL                                                 OR                                        > 80% decrease in PCT                                     Discontinue / Do not initiate                                             antibiotics Performed at [facility], [address] [place], [place] [address]   CBC     Status: Abnormal   Collection Time: 07/06/18  4:17 AM  Result Value Ref Range   WBC 20.0 (H) 4.0 -  10.5 K/uL   RBC 3.56 (L) 3.87 - 5.11 MIL/uL   Hemoglobin 11.3 (L) 12.0 - 15.0 g/dL   HCT 29.9 (L) 24.2 - 68.3 %   MCV 99.7 80.0 - 100.0 fL   MCH 31.7 26.0 - 34.0 pg   MCHC 31.8 30.0 - 36.0 g/dL   RDW 41.9 62.2 - 29.7 %   Platelets 262 150 - 400 K/uL   nRBC 0.1 0.0 - 0.2 %    Comment: Performed at [facility], [address] [place], [place] [address]    Dg Chest 2 View  Result Date: 07/06/2018 CLINICAL DATA:  Hypoxia. Shortness of breath. Dry cough for 6 months. EXAM: CHEST - 2 VIEW COMPARISON:  Radiographs and CT 07/05/2018. FINDINGS: The heart size and mediastinal contours are stable. The patchy airspace and ground-glass opacities in both lungs have improved since yesterday, especially on the right. There is no pneumothorax or significant pleural effusion. Postsurgical changes are noted status post lower cervical fusion. IMPRESSION: Interval improvement in patchy bilateral airspace and ground-glass opacities consistent with resolving inflammation or edema. Electronically Signed   By: Carey Bullocks M.D.   On: 07/06/2018 08:12   Dg Chest 2 View  Result Date: 07/05/2018 CLINICAL DATA:  Subacute onset of sore throat, cough and nasal congestion. EXAM: CHEST - 2 VIEW COMPARISON:  Chest radiograph performed 05/13/2018 FINDINGS: The lungs are well-aerated. Patchy bilateral airspace opacification is compatible with multifocal pneumonia. There is no evidence of pleural effusion or pneumothorax. The heart is borderline normal in size. Cervical spinal fusion hardware is noted. No acute osseous abnormalities are seen. IMPRESSION: Patchy bilateral airspace opacification is compatible with multifocal pneumonia. Followup PA and lateral chest X-ray is recommended in 3-4 weeks following trial of antibiotic therapy to ensure resolution and exclude underlying malignancy. Electronically Signed   By: Roanna Raider M.D.   On: 07/05/2018 03:45   Ct Chest W Contrast  Result Date:  07/05/2018 CLINICAL DATA:  Pneumonia, cough, fever, chills, generalized myalgias, shortness of breath, sore throat and fatigue for 3 weeks with worsened symptoms over past few days, history asthma, CHF, COPD, smoker EXAM: CT CHEST WITH CONTRAST TECHNIQUE: Multidetector CT imaging of the  chest was performed during intravenous contrast administration. Sagittal and coronal MPR images reconstructed from axial data set. CONTRAST:  75mL OMNIPAQUE IOHEXOL 300 MG/ML  SOLN COMPARISON:  None FINDINGS: Cardiovascular: Aorta normal caliber. No pericardial effusion. Thoracic vascular structures grossly unremarkable for technique. Mediastinum/Nodes: Esophagus normal appearance. Small parenchymal calcification medial to the LEFT thyroid lobe, nonspecific. Base of cervical region otherwise normal appearance. No thoracic adenopathy. Lungs/Pleura: Patchy BILATERAL airspace infiltrates consistent with pneumonia. No pleural effusion or pneumothorax. No definite pulmonary mass or nodule. Upper Abdomen: Beam hardening artifacts traverse liver. Visualized upper abdomen otherwise unremarkable. Musculoskeletal: Prior cervical spine fusion. No acute thoracic osseous abnormalities. IMPRESSION: Patchy BILATERAL airspace infiltrates diffusely throughout both lungs consistent with pneumonia. Electronically Signed   By: Ulyses Southward M.D.   On: 07/05/2018 14:54    Review of Systems  Constitutional: Positive for fever (Now resolved) and malaise/fatigue. Negative for chills, diaphoresis and weight loss.  HENT: Positive for congestion.   Eyes: Negative.   Respiratory: Positive for cough and shortness of breath.   Cardiovascular: Positive for palpitations.  Gastrointestinal: Positive for heartburn.  Genitourinary: Negative.   Musculoskeletal: Negative.   Skin: Negative.   Neurological: Negative.   Endo/Heme/Allergies: Negative.   Psychiatric/Behavioral: Negative.   All other systems reviewed and are negative.  Blood pressure 127/86,  pulse 77, temperature 98.3 F (36.8 C), temperature source Oral, resp. rate 18, height 5\' 4"  (1.626 m), weight 81.6 kg, last menstrual period 07/05/2018, SpO2 92 %. Physical Exam  Constitutional: She is oriented to person, place, and time. She appears well-developed. No distress.  Obese.  Laying flat comfortably in bed.  Not tachypneic.  Comfortable on room air.  HENT:  Head: Normocephalic and atraumatic.  Right Ear: External ear normal.  Left Ear: External ear normal.  Mouth/Throat: Oropharynx is clear and moist. No oropharyngeal exudate.  Eyes: Pupils are equal, round, and reactive to light. Conjunctivae and EOM are normal. No scleral icterus.  Neck: Normal range of motion. Neck supple. No JVD present. No tracheal deviation present. No thyromegaly present.  Cardiovascular: Normal rate, regular rhythm, normal heart sounds and intact distal pulses.  No murmur heard. Respiratory: Effort normal. No accessory muscle usage. No respiratory distress. She has no decreased breath sounds. She has no wheezes. She has no rhonchi.  Few scattered crackles.  Overall excellent air entry.  GI: Soft. Bowel sounds are normal.  Musculoskeletal: Normal range of motion.        General: No edema.  Lymphadenopathy:    She has no cervical adenopathy.  Neurological: She is alert and oriented to person, place, and time. No cranial nerve deficit.  Skin: Skin is warm. No rash noted. She is not diaphoretic.  Psychiatric: Her speech is normal and behavior is normal. Her affect is blunt.    Assessment/Plan: 1.  Bilateral pulmonary infiltrates: The resolution of symptoms to suggest inflammatory response have subsided very quickly.  In addition she could have had symptoms of acute intoxication of cocaine that have now resolved.  I do not believe the patient had sepsis and do not believe that she has acute bacterial infection or any infectious process.  Suspect her pulmonary infiltrates are related to cocaine/"crack "lung.   Management will be supportive.  Agree with steroids she will need a taper of steroids.  She needs to abstain from continued use of cocaine as this can eventually damage her pulmonary microvasculature and lead towards issues like pulmonary hypertension.  In addition foreign object inhalation with substances such as talc used  for "cutting" cocaine may cause microgranulomas in the lung.  I had a discussion with the patient with regards to discontinuation of use of cocaine.  She would benefit from rehabilitation program.  2.  Acute respiratory failure: Appears to have resolved this issue.  She is currently very comfortable on room air O2.  3.  Polysubstance abuse to include opiates and cocaine: Recommend abstinence from the substances.  4.  Asthma/COPD overlap syndrome: No evidence of exacerbation.  Continue her outpatient regimen.  5.  Obesity: This issue adds complexity to her management.  Weight loss is recommended.  Thank you for allowing us to participate in this patient's care.  We will not actively follow but please do not hesitate to call back with any questions.  Jennifer Vasquez  07/06/2018, 3:19 PM

## 2018-07-06 NOTE — Care Management Note (Signed)
Case Management Note  Patient Details  Name: Jennifer Vasquez MRN: [id number] Date of Birth: September 14, 1985  Subjective/Objective:    Patient is from home; lives with father.  Admitted with COPD exacerbation; recurrent pneumonia.  Flu negative; receiving IV antibiotics, nebs and IV steroids.  Patient is currently on room air.  Seems very weak and falling asleep during conversation.    She is current with her PCP at [facility]  They assist her with her medications there as she does not have insurance.  Denies difficulties obtaining medications there or with medical care.  Her boyfriend will transport home at discharge.  She is otherwise independent in all adl's.  No needs identified by this RNCM.             Action/Plan:   Expected Discharge Date:                  Expected Discharge Plan:  Home/Self Care  In-House Referral:     Discharge planning Services  CM Consult  Post Acute Care Choice:    Choice offered to:     DME Arranged:    DME Agency:     HH Arranged:    HH Agency:     Status of Service:  In process, will continue to follow  If discussed at Long Length of Stay Meetings, dates discussed:    Additional Comments:  Sherren Kerns, RN 07/06/2018, 2:34 PM

## 2018-07-06 NOTE — Progress Notes (Signed)
Date of Admission:  07/05/2018      ID: Jennifer Vasquez is a 32 y.o. female with   Active Problems:   Sepsis due to pneumonia [facility])    Subjective: Coughing Hoarse voice secondary to coughing  Medications:  . citalopram  20 mg Oral Daily  . enoxaparin (LOVENOX) injection  40 mg Subcutaneous Q24H  . Influenza vac split quadrivalent PF  0.5 mL Intramuscular Tomorrow-1000  . ipratropium-albuterol  3 mL Nebulization Q6H  . levothyroxine  25 mcg Oral QAC breakfast  . mometasone-formoterol  2 puff Inhalation BID  . [START ON 07/07/2018] predniSONE  40 mg Oral Q breakfast    Objective: Vital signs in last 24 hours: Temp:  [97.3 F (36.3 C)-98.3 F (36.8 C)] 98.3 F (36.8 C) (12/26 0804) Pulse Rate:  [73-89] 77 (12/26 0804) Resp:  [18-20] 18 (12/26 0804) BP: (120-130)/(75-86) 127/86 (12/26 0804) SpO2:  [92 %-95 %] 92 % (12/26 0804)  PHYSICAL EXAM:  General: Alert, cooperative, no distress,  HEENT WNL  Neck: Supple, symmetrical, no adenopathy, thyroid: non tender no carotid bruit and no JVD. Back: No CVA tenderness. Lungs: Bilateral air entry.  very few rhonchi Heart: Regular rate and rhythm, no murmur, rub or gallop. Abdomen: Soft, non-tender,not distended. Bowel sounds normal. No masses Extremities: atraumatic, no cyanosis. No edema. No clubbing Skin: No rashes or lesions. Or bruising Lymph: Cervical, supraclavicular normal. Neurologic: Grossly non-focal Lab Results Recent Labs    07/05/18 0436 07/06/18 0417  WBC 17.4* 20.0*  HGB 12.6 11.3*  HCT 37.9 35.5*  NA 142  --   K 4.1  --   CL 104  --   CO2 28  --   BUN 9  --   CREATININE 0.71  --    Liver Panel Recent Labs    07/05/18 0436  PROT 7.7  ALBUMIN 4.0  AST 18  ALT 17  ALKPHOS 61  BILITOT 0.4   Sedimentation Rate No results for input(s): ESRSEDRATE in the last 72 hours. C-Reactive Protein No results for input(s): CRP in the last 72 hours.  Microbiology: [facility]  neg  Studies/Results:      Dg Chest 2 View  Result Date: 07/06/2018 CLINICAL DATA:  Hypoxia. Shortness of breath. Dry cough for 6 months. EXAM: CHEST - 2 VIEW COMPARISON:  Radiographs and CT 07/05/2018. FINDINGS: The heart size and mediastinal contours are stable. The patchy airspace and ground-glass opacities in both lungs have improved since yesterday, especially on the right. There is no pneumothorax or significant pleural effusion. Postsurgical changes are noted status post lower cervical fusion. IMPRESSION: Interval improvement in patchy bilateral airspace and ground-glass opacities consistent with resolving inflammation or edema. Electronically Signed   By: Carey Bullocks M.D.   On: 07/06/2018 08:12   Dg Chest 2 View  Result Date: 07/05/2018 CLINICAL DATA:  Subacute onset of sore throat, cough and nasal congestion. EXAM: CHEST - 2 VIEW COMPARISON:  Chest radiograph performed 05/13/2018 FINDINGS: The lungs are well-aerated. Patchy bilateral airspace opacification is compatible with multifocal pneumonia. There is no evidence of pleural effusion or pneumothorax. The heart is borderline normal in size. Cervical spinal fusion hardware is noted. No acute osseous abnormalities are seen. IMPRESSION: Patchy bilateral airspace opacification is compatible with multifocal pneumonia. Followup PA and lateral chest X-ray is recommended in 3-4 weeks following trial of antibiotic therapy to ensure resolution and exclude underlying malignancy. Electronically Signed   By: Roanna Raider M.D.   On: 07/05/2018 03:45   Ct Chest W  Contrast  Result Date: 07/05/2018 CLINICAL DATA:  Pneumonia, cough, fever, chills, generalized myalgias, shortness of breath, sore throat and fatigue for 3 weeks with worsened symptoms over past few days, history asthma, CHF, COPD, smoker EXAM: CT CHEST WITH CONTRAST TECHNIQUE: Multidetector CT imaging of the chest was performed during intravenous contrast administration. Sagittal and  coronal MPR images reconstructed from axial data set. CONTRAST:  13mL OMNIPAQUE IOHEXOL 300 MG/ML  SOLN COMPARISON:  None FINDINGS: Cardiovascular: Aorta normal caliber. No pericardial effusion. Thoracic vascular structures grossly unremarkable for technique. Mediastinum/Nodes: Esophagus normal appearance. Small parenchymal calcification medial to the LEFT thyroid lobe, nonspecific. Base of cervical region otherwise normal appearance. No thoracic adenopathy. Lungs/Pleura: Patchy BILATERAL airspace infiltrates consistent with pneumonia. No pleural effusion or pneumothorax. No definite pulmonary mass or nodule. Upper Abdomen: Beam hardening artifacts traverse liver. Visualized upper abdomen otherwise unremarkable. Musculoskeletal: Prior cervical spine fusion. No acute thoracic osseous abnormalities. IMPRESSION: Patchy BILATERAL airspace infiltrates diffusely throughout both lungs consistent with pneumonia. Electronically Signed   By: Ulyses Southward M.D.   On: 07/05/2018 14:54     Assessment/Plan: 32 y.o. female with a history of hepatitis C, COPD, history of polysubstance use presents with cough of few weeks duration.  Patient states she has had intermittent cough for almost 6 months but it has gotten worse in the last few weeks.  She goes to [facility] and she got doxycycline which she has been taking it for the past [redacted] weeks along with prednisone 60 mg p.o. twice daily.  Not much of  relief from both.  She says she has subjective fever. She coughs up green sputum sometimes and occasionally it is blood-tinged.  The past 2 weeks she has shortness of breath while lying in bed and feels like she is drowning. ? ? ?Bilateral pulmonary infiltrates suggestive bilateral pneumonia.  She has been on doxycycline and prednisone the past 2 weeks. D.D bacterial versus viral versus atypical organisms VS non infectious . Common organisms causing pneumonia are pneumococcus, Haemophilus influenza, Moraxella -usually  responds to doxycycline. Need to rule out atypical organisms like Legionella and mycoplasma ( though they do respond to Doxy)  MRSA nares negative hence less likely to be MRSA pneumonia. Viral resp panel neg   As she has been on steroids will have to rule out P JP or other organisms like nocardia or fungal. Awaiting beta D glucan and fungal antibodies result  With snorting cocaine this could be a  reaction in the lungs.  She does not use electronic cigarette as this picture can fit with evali  Septic emboli to the lungs from a tricuspid valve endocarditis is a remote possibility but she denies IV drug use and also there is no cavitation of these infiltrates.  Because of COPD and sinus issues in the past eosinophilic  granulomatous polyangiitis is a remote possibility but no peripheral eosinophilia.   In patients with hep C cryoglobulinemic vasculitis  can cause bilateral pulmonary infiltrates which are usually hemorrhage.  Unlikely in her case.  Will send sputum culture, beta D glucan, fungal antibodies, respiratory viral PCR, QuantiFERON gold.  Continue ceftriaxone and azithromycin.   If no response to treatment may need further testing including bronch   COPD  Hepatitis C status post treatment with Harvoni.   Discussed the management with the patient

## 2018-07-06 NOTE — Plan of Care (Signed)
  Problem: Health Behavior/Discharge Planning: Goal: Ability to manage health-related needs will improve Outcome: Not Progressing Note:  Patient requesting multiple PRN medications throughout the shift already. Patient appears uncomfortable and weak. Will continue to monitor overall progress. Jari Favre [facility]

## 2018-07-07 LAB — URINE CULTURE: Culture: NO GROWTH

## 2018-07-07 LAB — CBC
HCT: 36.1 % (ref 36.0–46.0)
Hemoglobin: 11.4 g/dL — ABNORMAL LOW (ref 12.0–15.0)
MCH: 31.2 pg (ref 26.0–34.0)
MCHC: 31.6 g/dL (ref 30.0–36.0)
MCV: 98.9 fL (ref 80.0–100.0)
PLATELETS: 239 10*3/uL (ref 150–400)
RBC: 3.65 MIL/uL — ABNORMAL LOW (ref 3.87–5.11)
RDW: 13.7 % (ref 11.5–15.5)
WBC: 12.5 10*3/uL — ABNORMAL HIGH (ref 4.0–10.5)
nRBC: 0.2 % (ref 0.0–0.2)

## 2018-07-07 LAB — BASIC METABOLIC PANEL
Anion gap: 7 (ref 5–15)
BUN: 21 mg/dL — ABNORMAL HIGH (ref 6–20)
CHLORIDE: 105 mmol/L (ref 98–111)
CO2: 30 mmol/L (ref 22–32)
Calcium: 8.6 mg/dL — ABNORMAL LOW (ref 8.9–10.3)
Creatinine, Ser: 0.86 mg/dL (ref 0.44–1.00)
GFR calc Af Amer: >60 mL/min (ref >60)
GFR calc non Af Amer: >60 mL/min (ref >60)
Glucose, Bld: 89 mg/dL (ref 70–99)
POTASSIUM: 3.9 mmol/L (ref 3.5–5.1)
Sodium: 142 mmol/L (ref 135–145)

## 2018-07-07 LAB — PROCALCITONIN: Procalcitonin: 0.22 ng/mL

## 2018-07-07 LAB — HCV RNA QUANT: HCV Quantitative: NOT DETECTED IU/mL (ref >50)

## 2018-07-07 LAB — HIV ANTIBODY (ROUTINE TESTING W REFLEX): HIV Screen 4th Generation wRfx: NONREACTIVE

## 2018-07-07 LAB — LEGIONELLA PNEUMOPHILA SEROGP 1 UR AG: L. pneumophila Serogp 1 Ur Ag: NEGATIVE

## 2018-07-07 LAB — CALCIUM, IONIZED: Calcium, Ionized, Serum: 4.7 mg/dL (ref 4.5–5.6)

## 2018-07-07 MED ORDER — AZITHROMYCIN 500 MG PO TABS: ORAL_TABLET | ORAL | 0 refills | Status: DC

## 2018-07-07 MED ORDER — AZITHROMYCIN 250 MG PO TABS: 500.0000 mg | ORAL_TABLET | Freq: Once | ORAL | Status: DC

## 2018-07-07 NOTE — Progress Notes (Signed)
Date of Admission:  07/05/2018      ID: Jennifer Vasquez is a 32 y.o. female with   Active Problems:   Sepsis due to pneumonia [facility])    Subjective: Feeling better overall Says she is sweating a lot Medications:  . [START ON 07/08/2018] azithromycin  500 mg Oral Once  . citalopram  20 mg Oral Daily  . enoxaparin (LOVENOX) injection  40 mg Subcutaneous Q24H  . Influenza vac split quadrivalent PF  0.5 mL Intramuscular Tomorrow-1000  . levothyroxine  25 mcg Oral QAC breakfast  . mometasone-formoterol  2 puff Inhalation BID  . predniSONE  40 mg Oral Q breakfast    Objective: Vital signs in last 24 hours: Temp:  [98 F (36.7 C)-98.5 F (36.9 C)] 98.5 F (36.9 C) (12/27 0828) Pulse Rate:  [74-89] 84 (12/27 0828) Resp:  [17-20] 17 (12/27 0828) BP: (117-133)/(74-88) 117/74 (12/27 0828) SpO2:  [94 %-97 %] 96 % (12/27 0828)  PHYSICAL EXAM:  General: Alert, cooperative, no distress,  HEENT WNL  Neck: Supple, symmetrical, no adenopathy, thyroid: non tender no carotid bruit and no JVD. Back: No CVA tenderness. Lungs: Bilateral air entry.  very few rhonchi Heart: Regular rate and rhythm, no murmur, rub or gallop. Abdomen: Soft, non-tender,not distended. Bowel sounds normal. No masses Extremities: atraumatic, no cyanosis. No edema. No clubbing Skin: No rashes or lesions. Or bruising Lymph: Cervical, supraclavicular normal. Neurologic: Grossly non-focal Lab Results Recent Labs    07/05/18 0436 07/06/18 0417 07/07/18 0531  WBC 17.4* 20.0* 12.5*  HGB 12.6 11.3* 11.4*  HCT 37.9 35.5* 36.1  NA 142  --  142  K 4.1  --  3.9  CL 104  --  105  CO2 28  --  30  BUN 9  --  21*  CREATININE 0.71  --  0.86   Liver Panel Recent Labs    07/05/18 0436  PROT 7.7  ALBUMIN 4.0  AST 18  ALT 17  ALKPHOS 61  BILITOT 0.4   Sedimentation Rate No results for input(s): ESRSEDRATE in the last 72 hours. C-Reactive Protein No results for input(s): CRP in the last 72  hours.  Microbiology: [facility] neg  Studies/Results:      Dg Chest 2 View  Result Date: 07/06/2018 CLINICAL DATA:  Hypoxia. Shortness of breath. Dry cough for 6 months. EXAM: CHEST - 2 VIEW COMPARISON:  Radiographs and CT 07/05/2018. FINDINGS: The heart size and mediastinal contours are stable. The patchy airspace and ground-glass opacities in both lungs have improved since yesterday, especially on the right. There is no pneumothorax or significant pleural effusion. Postsurgical changes are noted status post lower cervical fusion. IMPRESSION: Interval improvement in patchy bilateral airspace and ground-glass opacities consistent with resolving inflammation or edema. Electronically Signed   By: Carey Bullocks M.D.   On: 07/06/2018 08:12   Ct Chest W Contrast  Result Date: 07/05/2018 CLINICAL DATA:  Pneumonia, cough, fever, chills, generalized myalgias, shortness of breath, sore throat and fatigue for 3 weeks with worsened symptoms over past few days, history asthma, CHF, COPD, smoker EXAM: CT CHEST WITH CONTRAST TECHNIQUE: Multidetector CT imaging of the chest was performed during intravenous contrast administration. Sagittal and coronal MPR images reconstructed from axial data set. CONTRAST:  74mL OMNIPAQUE IOHEXOL 300 MG/ML  SOLN COMPARISON:  None FINDINGS: Cardiovascular: Aorta normal caliber. No pericardial effusion. Thoracic vascular structures grossly unremarkable for technique. Mediastinum/Nodes: Esophagus normal appearance. Small parenchymal calcification medial to the LEFT thyroid lobe, nonspecific. Base of cervical region  otherwise normal appearance. No thoracic adenopathy. Lungs/Pleura: Patchy BILATERAL airspace infiltrates consistent with pneumonia. No pleural effusion or pneumothorax. No definite pulmonary mass or nodule. Upper Abdomen: Beam hardening artifacts traverse liver. Visualized upper abdomen otherwise unremarkable. Musculoskeletal: Prior cervical spine fusion. No acute thoracic  osseous abnormalities. IMPRESSION: Patchy BILATERAL airspace infiltrates diffusely throughout both lungs consistent with pneumonia. Electronically Signed   By: Ulyses Southward M.D.   On: 07/05/2018 14:54     Assessment/Plan: 32 y.o. female with a history of hepatitis C, COPD, history of polysubstance use presents with cough of few weeks duration.  Patient states she has had intermittent cough for almost 6 months but it has gotten worse in the last few weeks.  She goes to [facility] and she got doxycycline which she has been taking it for the past [redacted] weeks along with prednisone 60 mg p.o. twice daily.  Not much of  relief from both.  She says she has subjective fever. She coughs up green sputum sometimes and occasionally it is blood-tinged.  The past 2 weeks she has shortness of breath while lying in bed and feels like she is drowning. ? ? ?Bilateral pulmonary infiltrates suggestive bilateral pneumonia.  She has been on doxycycline and prednisone the past 2 weeks. D.D bacterial versus viral versus atypical organisms VS non infectious As procal was 0.88, WBC was high on presentation and both have improved with antibiotics will give antibiotic today  and Dc tomorrow   As she has been on steroids initially wanted to r/p t P JP but quick response in CXR does not favor it. Beta D glucan sent  With snorting cocaine this could be a  reaction in the lungs.  She does not use electronic cigarette as this picture can fit with evali    On ceftriaxone and azithromycin day 3-   If no response to treatment may need further testing including bronch   COPD  Hepatitis C status post treatment with Harvoni.   Discussed the management with the patient and Dr.Mayo Will sign off- call if needed

## 2018-07-07 NOTE — Progress Notes (Signed)
[employer] - [facility]   PATIENT NAME: Jennifer Vasquez    MR#:  [id number]  DATE OF BIRTH:  1986/03/16  SUBJECTIVE:   Doing little bit better this morning.  States that her shortness of breath has improved from when she first came in.  Still having some "rib pain".  REVIEW OF SYSTEMS:  CONSTITUTIONAL: No fever, fatigue or weakness.  EYES: No blurred or double vision.  EARS, NOSE, AND THROAT: No tinnitus or ear pain.  RESPIRATORY: +cough, shortness of breath, wheezing, no hemoptysis.  CARDIOVASCULAR: No chest pain, orthopnea, edema.  GASTROINTESTINAL: No nausea, vomiting, diarrhea or abdominal pain.  GENITOURINARY: No dysuria, hematuria.  ENDOCRINE: No polyuria, nocturia,  HEMATOLOGY: No anemia, easy bruising or bleeding SKIN: No rash or lesion. MUSCULOSKELETAL: No joint pain or arthritis.   NEUROLOGIC: No tingling, numbness, weakness.  PSYCHIATRY: No anxiety or depression.    DRUG ALLERGIES:   Allergies  Allergen Reactions  . Acetaminophen     Other reaction(s): Other (qualifier value) Due to hep c  . Amoxicillin-Pot Clavulanate Diarrhea and Nausea And Vomiting  . Gabapentin Hives  . Tetanus Toxoids   . Tramadol Hives  . Shellfish Allergy Rash    oysters    VITALS:  Blood pressure (!) 151/92, pulse 82, temperature 98.3 F (36.8 C), temperature source Oral, resp. rate 18, height 5\' 4"  (1.626 m), weight 81.6 kg, last menstrual period 07/05/2018, SpO2 95 %.  PHYSICAL EXAMINATION:  GENERAL:  32 y.o.-year-old patient lying in the bed with no acute distress.  EYES: Pupils equal, round, reactive to light and accommodation. No scleral icterus. Extraocular muscles intact.  HEENT: Head atraumatic, normocephalic. Oropharynx and nasopharynx clear.  Moist mucous membranes. NECK:  Supple, no jugular venous distention. No thyroid enlargement, no tenderness.  LUNGS: + Coarse breath sounds bilaterally improved from previous exam. No wheezing. No use of  accessory muscles of respiration.  CARDIOVASCULAR: RRR, S1, S2 normal. No murmurs, rubs, or gallops.  ABDOMEN: Soft, nontender, nondistended. Bowel sounds present. No organomegaly or mass.  EXTREMITIES: No pedal edema, cyanosis, or clubbing.  NEUROLOGIC: Cranial nerves II through XII are intact. Muscle strength 5/5 in all extremities. Sensation intact. Gait not checked.  PSYCHIATRIC: The patient is alert and oriented x 3.  SKIN: No obvious rash, lesion, or ulcer.   LABORATORY PANEL:   CBC Recent Labs  Lab 07/07/18 0531  WBC 12.5*  HGB 11.4*  HCT 36.1  PLT 239   ------------------------------------------------------------------------------------------------------------------  Chemistries  Recent Labs  Lab 07/05/18 0436 07/07/18 0531  NA 142 142  K 4.1 3.9  CL 104 105  CO2 28 30  GLUCOSE 134* 89  BUN 9 21*  CREATININE 0.71 0.86  CALCIUM 8.8* 8.6*  MG 2.2  --   AST 18  --   ALT 17  --   ALKPHOS 61  --   BILITOT 0.4  --    ------------------------------------------------------------------------------------------------------------------  Cardiac Enzymes Recent Labs  Lab 07/05/18 0436  TROPONINI <0.03   ------------------------------------------------------------------------------------------------------------------  RADIOLOGY:  Dg Chest 2 View  Result Date: 07/06/2018 CLINICAL DATA:  Hypoxia. Shortness of breath. Dry cough for 6 months. EXAM: CHEST - 2 VIEW COMPARISON:  Radiographs and CT 07/05/2018. FINDINGS: The heart size and mediastinal contours are stable. The patchy airspace and ground-glass opacities in both lungs have improved since yesterday, especially on the right. There is no pneumothorax or significant pleural effusion. Postsurgical changes are noted status post lower cervical fusion. IMPRESSION: Interval improvement in patchy bilateral airspace and ground-glass  opacities consistent with resolving inflammation or edema. Electronically Signed   By: Carey Bullocks M.D.   On: 07/06/2018 08:12    ASSESSMENT AND PLAN:   Acute hypoxic respiratory failure- likely secondary to cocaine use vs multifocal pneumonia. Has been treated with antibiotics for CAP. On room air. RVP negative. -Pulmonology consult -Continue IV steroids, will plan to discharge with steroid taper -Nebulizers as needed  Sepsis secondary to pneumonia- sepsis has resolved. Leukocytosis greatly improved. Blood and urine cultures negative. -Stop antibiotics -Strep pneumo negative -ID consulted- sputum culture, beta D glucan, fungal antibodies, Quantiferon gold sent  COPD exacerbation- improved, on room air -Continue steroids, plan for taper on discharge  History of hepatitis C- s/p treatment  Polysubstance abuse -Cessation counseling provided this admission  All the records are reviewed and case discussed with Care Management/Social Workerr. Management plans discussed with the patient, family and they are in agreement.  CODE STATUS: Full code.  TOTAL TIME TAKING CARE OF THIS PATIENT: 35 minutes.    POSSIBLE D/C tomorrow, DEPENDING ON CLINICAL CONDITION.   Jinny Blossom Kamorie Aldous M.D on 07/07/2018   Between 7am to 6pm - Pager - [phone]  After 6pm go to [email or web address] - Social research officer, government  [facility]  Office  [phone]  CC: Primary care physician; Center, [facility]  Note: This dictation was prepared with Nurse, children's dictation along with smaller phrase technology. Any transcriptional errors that result from this process are unintentional.

## 2018-07-07 NOTE — Progress Notes (Signed)
Talked to Dr. Anne Hahn about patient wanting to leave AMA as her 32 y/o daughter was emergently life flighted to [place]. Dr. Anne Hahn prescribed patient with a 7 days Azithromycin and sent to patient's pharmacy. RN instruct patient to call the pharmacy tomorrow for pick up. Discontinue telemetry monitor and peripheral IV.

## 2018-07-08 LAB — FUNGITELL, SERUM: Fungitell Result: 49 pg/mL (ref <80)

## 2018-07-08 NOTE — Discharge Summary (Signed)
[facility] [place] [facility]   PATIENT NAME: Jennifer Vasquez    MR#:  [id number]  DATE OF BIRTH:  08/20/1985  DATE OF ADMISSION:  07/05/2018   ADMITTING PHYSICIAN: Barbaraann Rondo, MD  DATE OF DISCHARGE: 07/07/2018 11:00 PM  PRIMARY CARE PHYSICIAN: [facility]   ADMISSION DIAGNOSIS:  Community acquired pneumonia, unspecified laterality [J18.9] DISCHARGE DIAGNOSIS:  Active Problems:   Sepsis due to pneumonia [facility])  SECONDARY DIAGNOSIS:   Past Medical History:  Diagnosis Date  . Acute carpal tunnel syndrome 10/10/2014  . Anxiety state 09/03/2009  . Asthma   . Bipolar disorder (HCC) 02/09/2013  . CHF (congestive heart failure) (HCC)   . COPD (chronic obstructive pulmonary disease) (HCC)   . DDD (degenerative disc disease), cervical 11/13/2014  . DDD (degenerative disc disease), lumbar 11/13/2014  . Hepatitis C   . Lumbosacral radiculopathy 11/13/2014  . Tobacco use disorder 09/06/2012   HOSPITAL COURSE:   Jennifer Vasquez is a 32 year old female who presented to the ED with redness of breath, cough, fevers, chills.  She had been treated multiple times for recurrent pneumonia in the past couple of months.  In the ED, chest x-ray showed patchy bilateral airspace opacification compatible with multifocal pneumonia.  She was meeting sepsis criteria on admission with leukocytosis and tachypnea.  She was admitted for further management.  Acute hypoxic respiratory failure- likely secondary to cocaine use vs multifocal pneumonia. RVP negative. -Treated empirically with IV antibiotics and steroids -Pulmonology consult- felt that her presentation was consistent with "crack lung" -Given IV steroids with plan to transition to steroid taper on discharge, but patient left AMA before this could be done.  Sepsis secondary to pneumonia- sepsis ruled out. Leukocytosis greatly improved. Blood and urine cultures negative. -Strep pneumo negative -ID consulted-  sputum culture, beta D glucan, fungal antibodies, Quantiferon gold sent- pending on discharge  COPD exacerbation- improved, on room air -Continued steroids, planned for taper on discharge but unable to do this due to patient leaving AMA  History of hepatitis C- s/p treatment  Polysubstance abuse -Cessation counseling provided this admission  Patient left AMA in the middle of the night due to emergency with daughter. 7 days of azithromycin sent to pharmacy per overnight doctor.  DISCHARGE CONDITIONS:  Acute respiratory failure-improved  COPD exacerbation Substance abuse History of hepatitis C CONSULTS OBTAINED:  Treatment Team:  Barbaraann Rondo, MD Lynn Ito, MD DRUG ALLERGIES:   Allergies  Allergen Reactions  . Acetaminophen     Other reaction(s): Other (qualifier value) Due to hep c  . Amoxicillin-Pot Clavulanate Diarrhea and Nausea And Vomiting  . Gabapentin Hives  . Tetanus Toxoids   . Tramadol Hives  . Shellfish Allergy Rash    oysters   DISCHARGE MEDICATIONS:   Allergies as of 07/07/2018      Reactions   Acetaminophen    Other reaction(s): Other (qualifier value) Due to hep c   Amoxicillin-pot Clavulanate Diarrhea, Nausea And Vomiting   Gabapentin Hives   Tetanus Toxoids    Tramadol Hives   Shellfish Allergy Rash   oysters      Medication List    TAKE these medications   azithromycin 500 MG tablet Commonly known as:  ZITHROMAX Take 1 tab by mouth daily for 7 days What changed:    medication strength  additional instructions     ASK your doctor about these medications   ADVAIR DISKUS 250-50 MCG/DOSE Aepb Generic drug:  Fluticasone-Salmeterol 1 inhalation 2 (two) times daily.  albuterol 108 (90 Base) MCG/ACT inhaler Commonly known as:  PROVENTIL HFA;VENTOLIN HFA Inhale 2 puffs into the lungs every 6 (six) hours as needed for wheezing or shortness of breath.   albuterol-ipratropium 18-103 MCG/ACT inhaler Commonly known  as:  COMBIVENT Inhale 1 puff into the lungs 4 (four) times daily as needed.   benzonatate 100 MG capsule Commonly known as:  TESSALON PERLES Take 1 capsule (100 mg total) by mouth every 6 (six) hours as needed for cough.   chlorpheniramine-HYDROcodone 10-8 MG/5ML Suer Commonly known as:  TUSSIONEX PENNKINETIC ER Take 5 mLs by mouth 2 (two) times daily.   citalopram 20 MG tablet Commonly known as:  CELEXA Take 1 tablet by mouth daily.   cyclobenzaprine 10 MG tablet Commonly known as:  FLEXERIL 3 (three) times daily.   fluticasone 50 MCG/ACT nasal spray Commonly known as:  FLONASE 2 sprays daily as needed.   guaiFENesin-codeine 100-10 MG/5ML syrup Take 10 mLs by mouth 3 (three) times daily as needed.   ibuprofen 800 MG tablet Commonly known as:  ADVIL,MOTRIN Take 800 mg by mouth every 6 (six) hours as needed for fever or mild pain.   ipratropium 0.02 % nebulizer solution Commonly known as:  ATROVENT Take 2.5 mLs (0.5 mg total) by nebulization 4 (four) times daily.   levothyroxine 25 MCG tablet Commonly known as:  SYNTHROID, LEVOTHROID Take 25 mcg by mouth daily before breakfast.   loratadine 10 MG tablet Commonly known as:  CLARITIN Take 10 mg by mouth daily.   nystatin 100000 UNIT/ML suspension Commonly known as:  MYCOSTATIN Use as directed 5 mLs (500,000 Units total) in the mouth or throat 4 (four) times daily.   ondansetron 4 MG disintegrating tablet Commonly known as:  ZOFRAN ODT Take 1 tablet (4 mg total) by mouth every 8 (eight) hours as needed for nausea or vomiting.   ondansetron 4 MG tablet Commonly known as:  ZOFRAN Take 1 tablet (4 mg total) by mouth daily as needed.   potassium chloride 10 MEQ tablet Commonly known as:  K-DUR Take 1 tablet (10 mEq total) by mouth daily.   predniSONE 50 MG tablet Commonly known as:  DELTASONE Take 1 tab p.o. daily for 5 days.   tiotropium 18 MCG inhalation capsule Commonly known as:  SPIRIVA Place 18 mcg into  inhaler and inhale daily.   traZODone 50 MG tablet Commonly known as:  DESYREL Take 50 mg by mouth at bedtime as needed for sleep.        DISCHARGE INSTRUCTIONS:  1.  F/u with PCP in 5 days 2.  Patient would benefit from continued cocaine cessation counseling as an outpatient DIET:  Regular diet DISCHARGE CONDITION:  Stable ACTIVITY:  Activity as tolerated OXYGEN:  Home Oxygen: No.  Oxygen Delivery: room air DISCHARGE LOCATION:  home   If you experience worsening of your admission symptoms, develop shortness of breath, life threatening emergency, suicidal or homicidal thoughts you must seek medical attention immediately by calling 911 or calling your MD immediately  if symptoms less severe.  You Must read complete instructions/literature along with all the possible adverse reactions/side effects for all the Medicines you take and that have been prescribed to you. Take any new Medicines after you have completely understood and accpet all the possible adverse reactions/side effects.   Please note  You were cared for by a hospitalist during your hospital stay. If you have any questions about your discharge medications or the care you received while you were in the  hospital after you are discharged, you can call the unit and asked to speak with the hospitalist on call if the hospitalist that took care of you is not available. Once you are discharged, your primary care physician will handle any further medical issues. Please note that NO REFILLS for any discharge medications will be authorized once you are discharged, as it is imperative that you return to your primary care physician (or establish a relationship with a primary care physician if you do not have one) for your aftercare needs so that they can reassess your need for medications and monitor your lab values.    On the day of Discharge:  VITAL SIGNS:  Blood pressure 104/75, pulse 84, temperature 98.7 F (37.1 C),  temperature source Oral, resp. rate 16, height 5\' 4"  (1.626 m), weight 81.6 kg, last menstrual period 07/05/2018, SpO2 96 %. PHYSICAL EXAMINATION:  GENERAL:  32 y.o.-year-old patient lying in the bed with no acute distress.  EYES: Pupils equal, round, reactive to light and accommodation. No scleral icterus. Extraocular muscles intact.  HEENT: Head atraumatic, normocephalic. Oropharynx and nasopharynx clear.  NECK:  Supple, no jugular venous distention. No thyroid enlargement, no tenderness.  LUNGS: Normal breath sounds bilaterally, no wheezing, rales,rhonchi or crepitation. No use of accessory muscles of respiration.  CARDIOVASCULAR: S1, S2 normal. No murmurs, rubs, or gallops.  ABDOMEN: Soft, non-tender, non-distended. Bowel sounds present. No organomegaly or mass.  EXTREMITIES: No pedal edema, cyanosis, or clubbing.  NEUROLOGIC: Cranial nerves II through XII are intact. Muscle strength 5/5 in all extremities. Sensation intact. Gait not checked.  PSYCHIATRIC: The patient is alert and oriented x 3.  SKIN: No obvious rash, lesion, or ulcer.  DATA REVIEW:   CBC Recent Labs  Lab 07/07/18 0531  WBC 12.5*  HGB 11.4*  HCT 36.1  PLT 239    Chemistries  Recent Labs  Lab 07/05/18 0436 07/07/18 0531  NA 142 142  K 4.1 3.9  CL 104 105  CO2 28 30  GLUCOSE 134* 89  BUN 9 21*  CREATININE 0.71 0.86  CALCIUM 8.8* 8.6*  MG 2.2  --   AST 18  --   ALT 17  --   ALKPHOS 61  --   BILITOT 0.4  --      Microbiology Results  Results for orders placed or performed during the hospital encounter of 07/05/18  Group A Strep by PCR     Status: None   Collection Time: 07/05/18  3:37 AM  Result Value Ref Range Status   Group A Strep by PCR NOT DETECTED NOT DETECTED Final    Comment: Performed at [facility], [address] [place], [place]  Blood Culture (routine x 2)     Status: None (Preliminary result)   Collection Time: 07/05/18  4:49 AM  Result Value Ref Range Status     Specimen Description BLOOD RIGHT ANTECUBITAL  Final   Special Requests   Final    BOTTLES DRAWN AEROBIC AND ANAEROBIC Blood Culture adequate volume   Culture   Final    NO GROWTH 3 DAYS Performed at [facility], [address] [place], [place]    Report Status PENDING  Incomplete  Blood Culture (routine x 2)     Status: None (Preliminary result)   Collection Time: 07/05/18  4:49 AM  Result Value Ref Range Status   Specimen Description BLOOD LEFT HAND  Final   Special Requests   Final    BOTTLES  DRAWN AEROBIC AND ANAEROBIC Blood Culture adequate volume   Culture   Final    NO GROWTH 3 DAYS Performed at [facility], [address] [place], [place] [address]    Report Status PENDING  Incomplete  MRSA PCR Screening     Status: None   Collection Time: 07/05/18  6:42 AM  Result Value Ref Range Status   MRSA by PCR NEGATIVE NEGATIVE Final    Comment:        The GeneXpert MRSA Assay (FDA approved for NASAL specimens only), is one component of a comprehensive MRSA colonization surveillance program. It is not intended to diagnose MRSA infection nor to guide or monitor treatment for MRSA infections. Performed at [facility], [address] [place], [place] [address]   Urine culture     Status: None   Collection Time: 07/05/18  1:20 PM  Result Value Ref Range Status   Specimen Description   Final    URINE, RANDOM Performed at [facility], [address] [place], [place] [address]    Special Requests   Final    NONE Performed at [facility], [address] [place], [place] [address]    Culture   Final    NO GROWTH Performed at [facility], [address] [place], [place] [address]    Report Status 07/07/2018 FINAL  Final  Respiratory Panel by PCR     Status: None   Collection Time: 07/05/18  4:46 PM  Result Value Ref Range Status   Adenovirus NOT DETECTED NOT DETECTED Final   Coronavirus 229E  NOT DETECTED NOT DETECTED Final   Coronavirus HKU1 NOT DETECTED NOT DETECTED Final   Coronavirus NL63 NOT DETECTED NOT DETECTED Final   Coronavirus OC43 NOT DETECTED NOT DETECTED Final   Metapneumovirus NOT DETECTED NOT DETECTED Final   Rhinovirus / Enterovirus NOT DETECTED NOT DETECTED Final   Influenza A NOT DETECTED NOT DETECTED Final   Influenza B NOT DETECTED NOT DETECTED Final   Parainfluenza Virus 1 NOT DETECTED NOT DETECTED Final   Parainfluenza Virus 2 NOT DETECTED NOT DETECTED Final   Parainfluenza Virus 3 NOT DETECTED NOT DETECTED Final   Parainfluenza Virus 4 NOT DETECTED NOT DETECTED Final   Respiratory Syncytial Virus NOT DETECTED NOT DETECTED Final   Bordetella pertussis NOT DETECTED NOT DETECTED Final   Chlamydophila pneumoniae NOT DETECTED NOT DETECTED Final   Mycoplasma pneumoniae NOT DETECTED NOT DETECTED Final    Comment: Performed at [facility], [address] [place], [place] [address]    RADIOLOGY:  No results found.   Management plans discussed with the patient, family and they are in agreement.  CODE STATUS: Prior   TOTAL TIME TAKING CARE OF THIS PATIENT: 35 minutes.    Jinny BlossomKaty D Mayo M.D on 07/08/2018 at 6:50 PM  Between 7am to 6pm - Pager - [phone]  After 6pm go to [email or web address] - Scientist, research (life sciences)password [email or web address]  [facility] [place] [facility]  Office  [phone]  CC: Primary care physician; [facility]   Note: This dictation was prepared with Nurse, children'sDragon dictation along with smaller phrase technology. Any transcriptional errors that result from this process are unintentional.

## 2018-07-09 LAB — FUNGAL ANTIBODIES PANEL, ID-BLOOD
ASPERGILLUS FLAVUS: NEGATIVE
Aspergillus fumigatus, IgG: NEGATIVE
Aspergillus niger: NEGATIVE
Blastomyces Abs, Qn, DID: NEGATIVE
Histoplasma Ab, Immunodiffusion: NEGATIVE

## 2018-07-10 LAB — QUANTIFERON-TB GOLD PLUS (RQFGPL)
QuantiFERON Mitogen Value: 0.07 IU/mL
QuantiFERON Nil Value: 0.02 IU/mL
QuantiFERON TB1 Ag Value: 0.03 IU/mL
QuantiFERON TB2 Ag Value: 0.02 IU/mL

## 2018-07-10 LAB — CULTURE, BLOOD (ROUTINE X 2)
Culture: NO GROWTH
Culture: NO GROWTH
SPECIAL REQUESTS: ADEQUATE
Special Requests: ADEQUATE

## 2018-07-10 LAB — QUANTIFERON-TB GOLD PLUS: QuantiFERON-TB Gold Plus: UNDETERMINED

## 2018-09-03 ENCOUNTER — Emergency Department: Payer: Self-pay

## 2018-09-03 ENCOUNTER — Emergency Department
Admission: EM | Admit: 2018-09-03 | Discharge: 2018-09-03 | Disposition: A | Discharge status: Home / Self Care | Payer: Self-pay | Attending: Emergency Medicine | Admitting: Emergency Medicine

## 2018-09-03 ENCOUNTER — Other Ambulatory Visit: Payer: Self-pay

## 2018-09-03 ENCOUNTER — Encounter: Payer: Self-pay | Admitting: Emergency Medicine

## 2018-09-03 DIAGNOSIS — F17210 Nicotine dependence, cigarettes, uncomplicated: Secondary | ICD-10-CM | POA: Insufficient documentation

## 2018-09-03 DIAGNOSIS — I509 Heart failure, unspecified: Secondary | ICD-10-CM | POA: Insufficient documentation

## 2018-09-03 DIAGNOSIS — Z79899 Other long term (current) drug therapy: Secondary | ICD-10-CM | POA: Insufficient documentation

## 2018-09-03 DIAGNOSIS — F419 Anxiety disorder, unspecified: Secondary | ICD-10-CM | POA: Insufficient documentation

## 2018-09-03 DIAGNOSIS — J111 Influenza due to unidentified influenza virus with other respiratory manifestations: Secondary | ICD-10-CM | POA: Insufficient documentation

## 2018-09-03 DIAGNOSIS — J45909 Unspecified asthma, uncomplicated: Secondary | ICD-10-CM | POA: Insufficient documentation

## 2018-09-03 DIAGNOSIS — J449 Chronic obstructive pulmonary disease, unspecified: Secondary | ICD-10-CM | POA: Insufficient documentation

## 2018-09-03 LAB — CBC WITH DIFFERENTIAL/PLATELET
Abs Immature Granulocytes: 0.05 10*3/uL (ref 0.00–0.07)
Basophils Absolute: 0.1 10*3/uL (ref 0.0–0.1)
Basophils Relative: 1 %
Eosinophils Absolute: 0.5 10*3/uL (ref 0.0–0.5)
Eosinophils Relative: 4 %
HCT: 36.9 % (ref 36.0–46.0)
Hemoglobin: 12.1 g/dL (ref 12.0–15.0)
Immature Granulocytes: 0 %
Lymphocytes Relative: 34 %
Lymphs Abs: 4.2 10*3/uL — ABNORMAL HIGH (ref 0.7–4.0)
MCH: 30.6 pg (ref 26.0–34.0)
MCHC: 32.8 g/dL (ref 30.0–36.0)
MCV: 93.4 fL (ref 80.0–100.0)
Monocytes Absolute: 1 10*3/uL (ref 0.1–1.0)
Monocytes Relative: 8 %
Neutro Abs: 6.5 10*3/uL (ref 1.7–7.7)
Neutrophils Relative %: 53 %
Platelets: 280 10*3/uL (ref 150–400)
RBC: 3.95 MIL/uL (ref 3.87–5.11)
RDW: 13.5 % (ref 11.5–15.5)
WBC: 12.2 10*3/uL — ABNORMAL HIGH (ref 4.0–10.5)
nRBC: 0 % (ref 0.0–0.2)

## 2018-09-03 LAB — COMPREHENSIVE METABOLIC PANEL
ALT: 14 U/L (ref 0–44)
AST: 16 U/L (ref 15–41)
Albumin: 3.8 g/dL (ref 3.5–5.0)
Alkaline Phosphatase: 55 U/L (ref 38–126)
Anion gap: 7 (ref 5–15)
BUN: 13 mg/dL (ref 6–20)
CO2: 25 mmol/L (ref 22–32)
Calcium: 8.5 mg/dL — ABNORMAL LOW (ref 8.9–10.3)
Chloride: 105 mmol/L (ref 98–111)
Creatinine, Ser: 0.57 mg/dL (ref 0.44–1.00)
GFR calc Af Amer: >60 mL/min (ref >60)
GFR calc non Af Amer: >60 mL/min (ref >60)
Glucose, Bld: 102 mg/dL — ABNORMAL HIGH (ref 70–99)
Potassium: 4.4 mmol/L (ref 3.5–5.1)
Sodium: 137 mmol/L (ref 135–145)
Total Bilirubin: 0.4 mg/dL (ref 0.3–1.2)
Total Protein: 6.6 g/dL (ref 6.5–8.1)

## 2018-09-03 LAB — INFLUENZA PANEL BY PCR (TYPE A & B)
Influenza A By PCR: NEGATIVE
Influenza B By PCR: NEGATIVE

## 2018-09-03 LAB — BRAIN NATRIURETIC PEPTIDE: B Natriuretic Peptide: 53 pg/mL (ref 0.0–100.0)

## 2018-09-03 MED ORDER — HYDROXYZINE HCL 50 MG PO TABS
50.0000 mg | ORAL_TABLET | Freq: Once | ORAL | Status: AC
  Administered 2018-09-03: 50 mg via ORAL
  Filled 2018-09-03: qty 1

## 2018-09-03 MED ORDER — HYDROXYZINE HCL 25 MG PO TABS: 25.0000 mg | ORAL_TABLET | Freq: Three times a day (TID) | ORAL | 0 refills | Status: AC | PRN

## 2018-09-03 MED ORDER — BENZONATATE 100 MG PO CAPS: 100.0000 mg | ORAL_CAPSULE | Freq: Three times a day (TID) | ORAL | 0 refills | Status: AC | PRN

## 2018-09-03 MED ORDER — IPRATROPIUM-ALBUTEROL 0.5-2.5 (3) MG/3ML IN SOLN
3.0000 mL | Freq: Once | RESPIRATORY_TRACT | Status: AC
  Administered 2018-09-03: 3 mL via RESPIRATORY_TRACT
  Filled 2018-09-03: qty 3

## 2018-09-03 NOTE — ED Provider Notes (Signed)
[facility] Emergency Department Provider Note  ____________________________________________  Time seen: Approximately 9:28 PM  I have reviewed the triage vital signs and the nursing notes.   HISTORY  Chief Complaint Influenza    HPI Jennifer Vasquez is a 33 y.o. female with a history of COPD, CHF and community-acquired pneumonia, presents to the emergency department with reports of swelling of the hands and the feet with pinprick sensation also of the hands and the feet.  Patient reports that she has had these symptoms for the past 3 days in association with cough, low-grade fever, body aches and nasal congestion.  Patient reports that she has been wheezing at home and wheezing has not been improving with breathing treatments.  She denies chest pain, nausea, vomiting or abdominal pain.  No other alleviating measures have been attempted.   Past Medical History:  Diagnosis Date  . Acute carpal tunnel syndrome 10/10/2014  . Anxiety state 09/03/2009  . Asthma   . Bipolar disorder (HCC) 02/09/2013  . CHF (congestive heart failure) (HCC)   . COPD (chronic obstructive pulmonary disease) (HCC)   . DDD (degenerative disc disease), cervical 11/13/2014  . DDD (degenerative disc disease), lumbar 11/13/2014  . Hepatitis C   . Lumbosacral radiculopathy 11/13/2014  . Tobacco use disorder 09/06/2012    Patient Active Problem List   Diagnosis Date Noted  . Sepsis due to pneumonia (HCC) 07/05/2018  . Community acquired pneumonia of left lung   . Respiratory failure (HCC) 11/05/2016  . DDD (degenerative disc disease), lumbar 11/13/2014  . DDD (degenerative disc disease), cervical 11/13/2014  . Lumbosacral radiculopathy 11/13/2014  . Acute carpal tunnel syndrome 10/10/2014  . Chronic pain 03/02/2013  . Depression 03/02/2013  . Bipolar disorder (HCC) 02/09/2013  . Hepatitis C 12/18/2012  . Asthma 11/23/2012  . Neck pain 11/23/2012  . Torticollis 11/23/2012  . Tobacco use  disorder 09/06/2012  . Anxiety state 09/03/2009    Past Surgical History:  Procedure Laterality Date  . CERVICAL DISC ARTHROPLASTY    . TONSILLECTOMY    . TUBAL LIGATION      Prior to Admission medications   Medication Sig Start Date End Date Taking? Authorizing Provider  albuterol (PROVENTIL HFA;VENTOLIN HFA) 108 (90 Base) MCG/ACT inhaler Inhale 2 puffs into the lungs every 6 (six) hours as needed for wheezing or shortness of breath. 01/17/18   Merrily Brittle, MD  albuterol-ipratropium (COMBIVENT) 18-103 MCG/ACT inhaler Inhale 1 puff into the lungs 4 (four) times daily as needed.     [provider]  azithromycin (ZITHROMAX) 500 MG tablet Take 1 tab by mouth daily for 7 days 07/08/18   Oralia Manis, MD  benzonatate (TESSALON PERLES) 100 MG capsule Take 1 capsule (100 mg total) by mouth 3 (three) times daily as needed for up to 7 days for cough. 09/03/18 09/10/18  Orvil Feil, PA-C  chlorpheniramine-HYDROcodone (TUSSIONEX PENNKINETIC ER) 10-8 MG/5ML SUER Take 5 mLs by mouth 2 (two) times daily. Patient not taking: Reported on 05/13/2018 03/21/17   Emily Filbert, MD  citalopram (CELEXA) 20 MG tablet Take 1 tablet by mouth daily. 11/25/16   [provider]  cyclobenzaprine (FLEXERIL) 10 MG tablet 3 (three) times daily.  06/04/16   [provider]  fluticasone (FLONASE) 50 MCG/ACT nasal spray 2 sprays daily as needed.  06/11/16   [provider]  Fluticasone-Salmeterol (ADVAIR DISKUS) 250-50 MCG/DOSE AEPB 1 inhalation 2 (two) times daily. 04/01/14   [provider]  guaiFENesin-codeine 100-10 MG/5ML syrup Take  10 mLs by mouth 3 (three) times daily as needed. Patient not taking: Reported on 05/13/2018 10/29/17   Kem Boroughs B, FNP  hydrOXYzine (ATARAX/VISTARIL) 25 MG tablet Take 1 tablet (25 mg total) by mouth every 8 (eight) hours as needed for up to 5 days. 09/03/18 09/08/18  Orvil Feil, PA-C  ibuprofen (ADVIL,MOTRIN) 800 MG tablet Take 800  mg by mouth every 6 (six) hours as needed for fever or mild pain.     [provider]  ipratropium (ATROVENT) 0.02 % nebulizer solution Take 2.5 mLs (0.5 mg total) by nebulization 4 (four) times daily. 10/29/17   Triplett, Cari B, FNP  levothyroxine (SYNTHROID, LEVOTHROID) 25 MCG tablet Take 25 mcg by mouth daily before breakfast.    [provider]  loratadine (CLARITIN) 10 MG tablet Take 10 mg by mouth daily.    [provider]  nystatin (MYCOSTATIN) 100000 UNIT/ML suspension Use as directed 5 mLs (500,000 Units total) in the mouth or throat 4 (four) times daily. Patient not taking: Reported on 03/21/2017 11/15/16   Milagros Loll, MD  ondansetron (ZOFRAN ODT) 4 MG disintegrating tablet Take 1 tablet (4 mg total) by mouth every 8 (eight) hours as needed for nausea or vomiting. Patient not taking: Reported on 05/13/2018 01/13/17   Midge Minium, MD  ondansetron (ZOFRAN) 4 MG tablet Take 1 tablet (4 mg total) by mouth daily as needed. 05/13/18   Schaevitz, Myra Rude, MD  potassium chloride (K-DUR) 10 MEQ tablet Take 1 tablet (10 mEq total) by mouth daily. Patient not taking: Reported on 03/21/2017 11/15/16   Milagros Loll, MD  predniSONE (DELTASONE) 50 MG tablet Take 1 tab p.o. daily for 5 days. 05/13/18   Myrna Blazer, MD  tiotropium (SPIRIVA) 18 MCG inhalation capsule Place 18 mcg into inhaler and inhale daily.    [provider]  traZODone (DESYREL) 50 MG tablet Take 50 mg by mouth at bedtime as needed for sleep.    [provider]    Allergies Acetaminophen; Amoxicillin-pot clavulanate; Gabapentin; Tetanus toxoids; Tramadol; and Shellfish allergy  Family History  Problem Relation Age of Onset  . Arthritis Mother   . Asthma Mother   . Diabetes Mother   . Hyperlipidemia Mother   . Vision loss Mother   . Alcohol abuse Father   . Asthma Father   . Diabetes Father   . Heart disease Father   . Depression Father   . COPD Father   . Cancer  Father   . Hypertension Father   . Kidney disease Father   . Vision loss Father     Social History Social History   Tobacco Use  . Smoking status: Current Some Day Smoker    Packs/day: 0.50    Types: Cigarettes  . Smokeless tobacco: Never Used  Substance Use Topics  . Alcohol use: Yes    Alcohol/week: 0.0 standard drinks  . Drug use: No      Review of Systems  Constitutional: Patient has fever.  Eyes: No visual changes. No discharge ENT: Patient has congestion.  Cardiovascular: no chest pain. Respiratory: Patient has cough.  Gastrointestinal: No abdominal pain.  No nausea, no vomiting. No diarrhea.  Genitourinary: Negative for dysuria. No hematuria Musculoskeletal: Patient has myalgias.  Skin: Negative for rash, abrasions, lacerations, ecchymosis. Neurological: Patient has headache, no focal weakness or numbness.     ____________________________________________   PHYSICAL EXAM:  VITAL SIGNS: ED Triage Vitals  Enc Vitals Group     BP 09/03/18 1916 123/63  Pulse Rate 09/03/18 1916 96     Resp 09/03/18 1916 (!) 22     Temp 09/03/18 1916 98.3 F (36.8 C)     Temp Source 09/03/18 1916 Oral     SpO2 09/03/18 1916 93 %     Weight 09/03/18 1917 191 lb (86.6 kg)     Height 09/03/18 1917 5\' 3"  (1.6 m)     Head Circumference --      Peak Flow --      Pain Score 09/03/18 1917 10     Pain Loc --      Pain Edu? --      Excl. in GC? --      Constitutional: Alert and oriented. Patient is lying supine. Eyes: Conjunctivae are normal. PERRL. EOMI. Head: Atraumatic. ENT:      Ears: Tympanic membranes are mildly injected with mild effusion bilaterally.       Nose: No congestion/rhinnorhea.      Mouth/Throat: Mucous membranes are moist. Posterior pharynx is mildly erythematous.  Hematological/Lymphatic/Immunilogical: No cervical lymphadenopathy.  Cardiovascular: Normal rate, regular rhythm. Normal S1 and S2.  Good peripheral circulation. Respiratory: Normal  respiratory effort without tachypnea or retractions. Lungs CTAB. Good air entry to the bases with no decreased or absent breath sounds. Gastrointestinal: Bowel sounds 4 quadrants. Soft and nontender to palpation. No guarding or rigidity. No palpable masses. No distention. No CVA tenderness. Musculoskeletal: Full range of motion to all extremities. No gross deformities appreciated. Neurologic:  Normal speech and language. No gross focal neurologic deficits are appreciated.  Skin:  Skin is warm, dry and intact. No rash noted. Psychiatric: Mood and affect are normal. Speech and behavior are normal. Patient exhibits appropriate insight and judgement.    ____________________________________________   LABS (all labs ordered are listed, but only abnormal results are displayed)  Labs Reviewed  CBC WITH DIFFERENTIAL/PLATELET - Abnormal; Notable for the following components:      Result Value   WBC 12.2 (*)    Lymphs Abs 4.2 (*)    All other components within normal limits  COMPREHENSIVE METABOLIC PANEL - Abnormal; Notable for the following components:   Glucose, Bld 102 (*)    Calcium 8.5 (*)    All other components within normal limits  INFLUENZA PANEL BY PCR (TYPE A & B)  BRAIN NATRIURETIC PEPTIDE   ____________________________________________  EKG   ____________________________________________  RADIOLOGY I personally viewed and evaluated these images as part of my medical decision making, as well as reviewing the written report by the radiologist.  Dg Chest 2 View  Result Date: 09/03/2018 CLINICAL DATA:  Cough EXAM: CHEST - 2 VIEW COMPARISON:  07/06/2018 FINDINGS: Heart is borderline in size. Mild peribronchial thickening. Low lung volumes with bibasilar atelectasis. No effusions or acute bony abnormality. IMPRESSION: Low lung volumes with bronchitic changes and bibasilar atelectasis. Electronically Signed   By: Charlett Nose M.D.   On: 09/03/2018 20:06     ____________________________________________    PROCEDURES  Procedure(s) performed:    Procedures    Medications  ipratropium-albuterol (DUONEB) 0.5-2.5 (3) MG/3ML nebulizer solution 3 mL (3 mLs Nebulization Given 09/03/18 2131)  hydrOXYzine (ATARAX/VISTARIL) tablet 50 mg (50 mg Oral Given 09/03/18 2239)     ____________________________________________   INITIAL IMPRESSION / ASSESSMENT AND PLAN / ED COURSE  Pertinent labs & imaging results that were available during my care of the patient were reviewed by me and considered in my medical decision making (see chart for details).  Review of the [place] CSRS was  performed in accordance of the [facility] prior to dispensing any controlled drugs.      Assessment and Plan:  Influenza-like illness Anxiety Patient presents to the emergency department with flulike symptoms for the past 3 days.  Patient also complained of tingling in her hands and her feet.  Patient was hyperventilating when I initially talked with her.  After patient had time to calm down in the exam room, she remarked that paresthesias had improved.  Patient also reports that she has been trying to withdraw from several of her antipsychotic medications.  Patient requested medication for anxiety and she was prescribed hydroxyzine.  [facility]  She was advised to follow-up with primary care as needed.  All patient questions were answered.    ____________________________________________  FINAL CLINICAL IMPRESSION(S) / ED DIAGNOSES  Final diagnoses:  Anxiety  Influenza      NEW MEDICATIONS STARTED DURING THIS VISIT:  ED Discharge Orders         Ordered    hydrOXYzine (ATARAX/VISTARIL) 25 MG tablet  Every 8 hours PRN     09/03/18 2237    benzonatate (TESSALON PERLES) 100 MG capsule  3 times daily PRN     09/03/18 2237              This chart was dictated using voice recognition software/Dragon. Despite best  efforts to proofread, errors can occur which can change the meaning. Any change was purely unintentional.    Orvil FeilWoods, Ivelise Castillo M, PA-C 09/03/18 2309    Minna AntisPaduchowski, Kevin, MD 09/03/18 [phone]

## 2018-09-03 NOTE — ED Triage Notes (Signed)
Pt to triage via wheelchair. Pt reports 3 days of flu like sx. Hx of asthma and using her nebulizer with only a little relief. Last took tylenol around 530 pm.

## 2018-09-04 ENCOUNTER — Other Ambulatory Visit: Payer: Self-pay

## 2018-09-04 ENCOUNTER — Encounter: Payer: Self-pay | Admitting: Emergency Medicine

## 2018-09-04 ENCOUNTER — Emergency Department
Admission: EM | Admit: 2018-09-04 | Discharge: 2018-09-04 | Disposition: A | Discharge status: Home / Self Care | Payer: Self-pay | Attending: Emergency Medicine | Admitting: Emergency Medicine

## 2018-09-04 DIAGNOSIS — R1013 Epigastric pain: Secondary | ICD-10-CM | POA: Insufficient documentation

## 2018-09-04 DIAGNOSIS — Z5321 Procedure and treatment not carried out due to patient leaving prior to being seen by health care provider: Secondary | ICD-10-CM | POA: Insufficient documentation

## 2018-09-04 LAB — COMPREHENSIVE METABOLIC PANEL
ALT: 14 U/L (ref 0–44)
AST: 18 U/L (ref 15–41)
Albumin: 3.9 g/dL (ref 3.5–5.0)
Alkaline Phosphatase: 64 U/L (ref 38–126)
Anion gap: 9 (ref 5–15)
BUN: 13 mg/dL (ref 6–20)
CO2: 28 mmol/L (ref 22–32)
Calcium: 9.2 mg/dL (ref 8.9–10.3)
Chloride: 102 mmol/L (ref 98–111)
Creatinine, Ser: 0.77 mg/dL (ref 0.44–1.00)
GFR calc non Af Amer: >60 mL/min (ref >60)
Glucose, Bld: 106 mg/dL — ABNORMAL HIGH (ref 70–99)
POTASSIUM: 3.8 mmol/L (ref 3.5–5.1)
Sodium: 139 mmol/L (ref 135–145)
Total Bilirubin: 0.3 mg/dL (ref 0.3–1.2)
Total Protein: 7.2 g/dL (ref 6.5–8.1)

## 2018-09-04 LAB — TROPONIN I: Troponin I: <0.03 ng/mL (ref <0.03)

## 2018-09-04 LAB — CBC
HCT: 38.3 % (ref 36.0–46.0)
Hemoglobin: 12.6 g/dL (ref 12.0–15.0)
MCH: 30.4 pg (ref 26.0–34.0)
MCHC: 32.9 g/dL (ref 30.0–36.0)
MCV: 92.5 fL (ref 80.0–100.0)
NRBC: 0 % (ref 0.0–0.2)
Platelets: 264 10*3/uL (ref 150–400)
RBC: 4.14 MIL/uL (ref 3.87–5.11)
RDW: 13.6 % (ref 11.5–15.5)
WBC: 12.4 10*3/uL — ABNORMAL HIGH (ref 4.0–10.5)

## 2018-09-04 LAB — LIPASE, BLOOD: Lipase: 24 U/L (ref 11–51)

## 2018-09-04 NOTE — ED Triage Notes (Signed)
Pt easily awakened in the wheelchair in [place] for triage; Pt says she was discharged from here last night around 7pm, diagnosed with anxiety and influenza "even though it was negative"; pt says she ate salmon after she got home; pt says she was laying down, trying to fall asleep around 2am when she began to have epigastric pain; pain radiates through to her back; pt says she has vomited "until there wasn't nothing left"; pt says she had gallbladder issues "years ago" but has never had cholecystectomy;

## 2018-09-05 ENCOUNTER — Telehealth: Payer: Self-pay | Admitting: Emergency Medicine

## 2018-09-05 NOTE — Telephone Encounter (Signed)
Called patient due to lwot to inquire about condition and follow up plans.  Says she is still feeling bad and has called her doctor.  Waiting for call back.  I told her to let them know that lab results are available in epic so they can review.

## 2018-11-06 IMAGING — US US [PERSON_NAME]
1 series · 13 of 15 positions shown · non-contrast
Comparison: None.

CLINICAL DATA: Chronic hepatitis-C without hepatic coma.

EXAM:
US ABDOMEN LIMITED - RIGHT UPPER QUADRANT
ULTRASOUND HEPATIC ELASTOGRAPHY
TECHNIQUE: Limited right upper quadrant abdominal ultrasound was performed. In
addition, ultrasound elastography evaluation of the liver was
performed. A region of interest was placed in the right lobe of the
liver. Following application of a compressive sonographic pulse,
shear waves were detected in the adjacent hepatic tissue and the
shear wave velocity was calculated. Multiple assessments were
performed at the selected site. Median shear wave velocity is
correlated to a Metavir fibrosis score.

[Series 1: us (person_name) · 0.19mm/px · 13 of 15 slices shown]
[im 1/15]
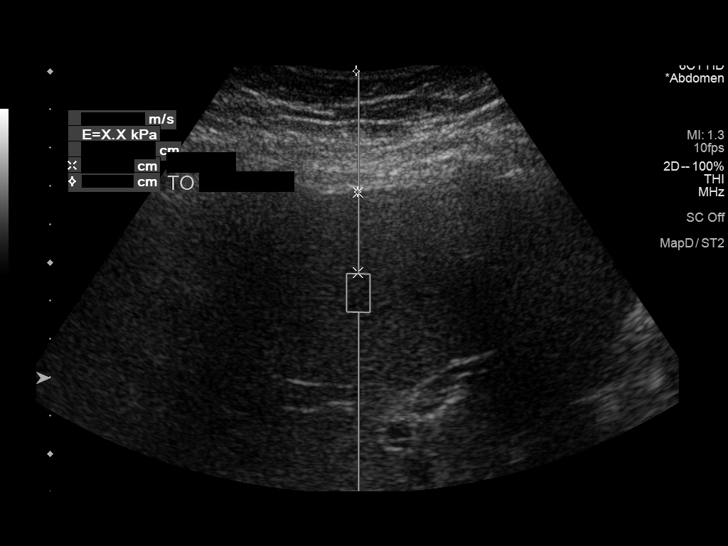
[im 2/15]
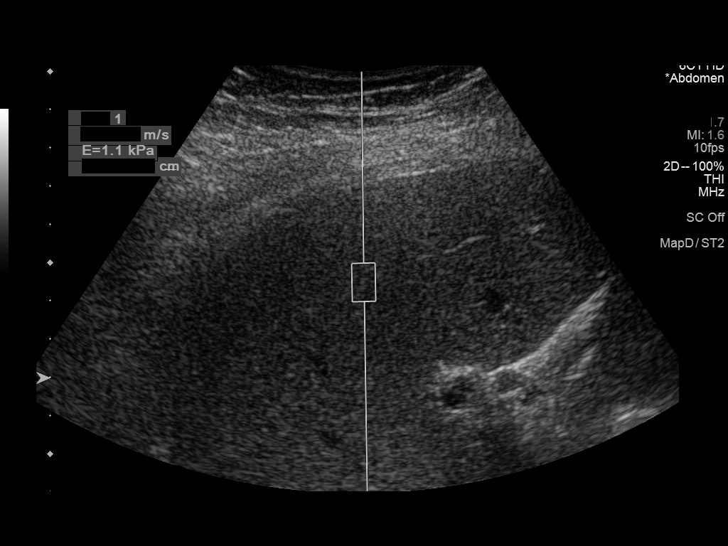
[im 3/15]
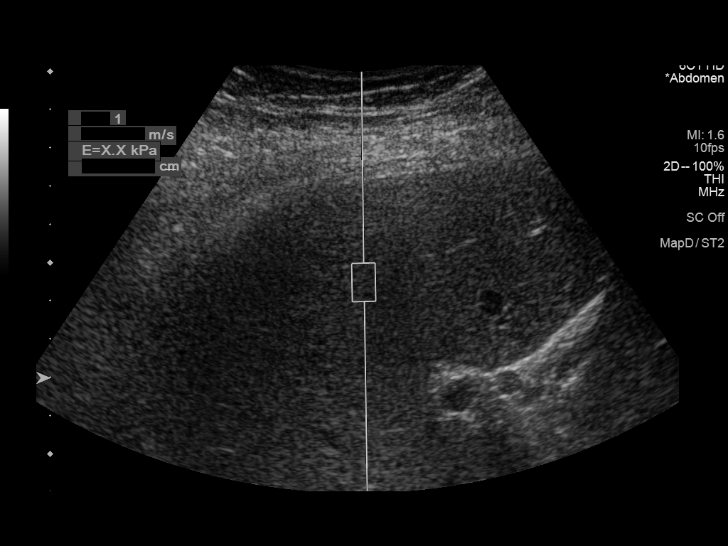
[im 5/15]
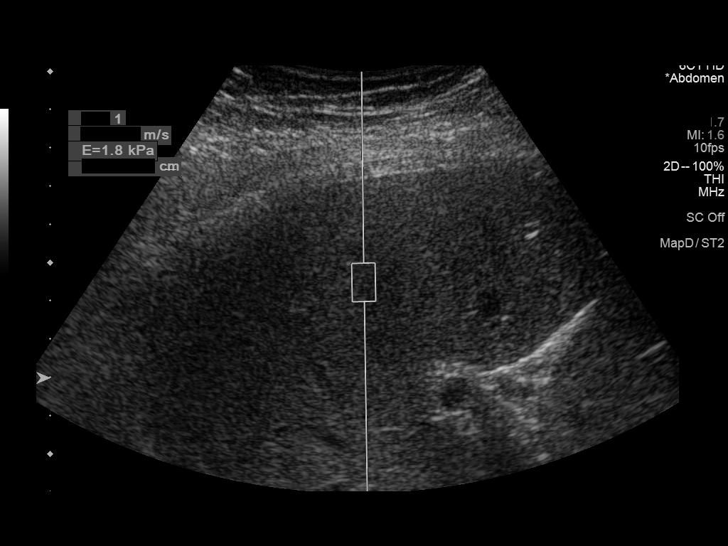
[im 6/15]
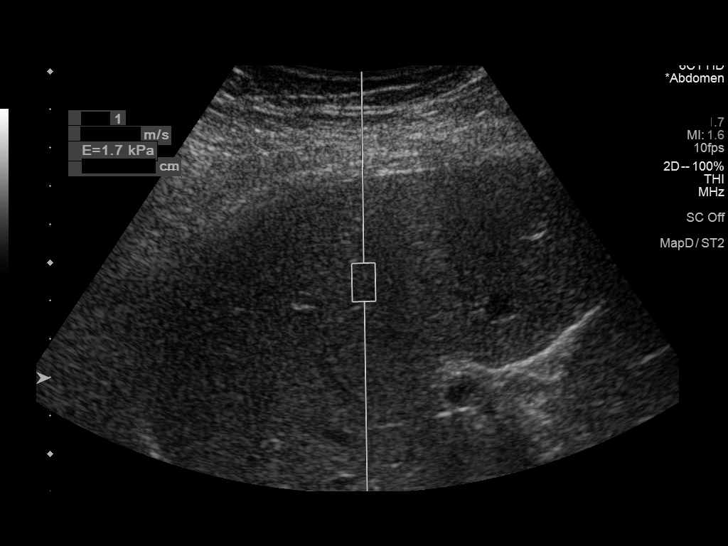
[im 7/15]
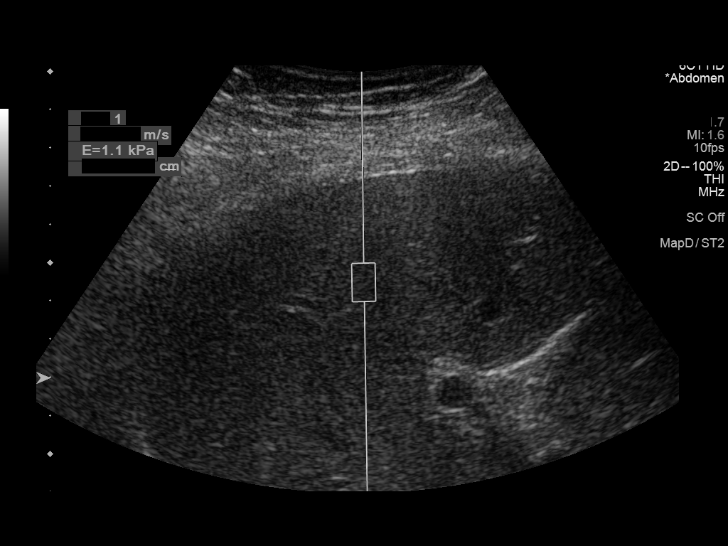
[im 8/15]
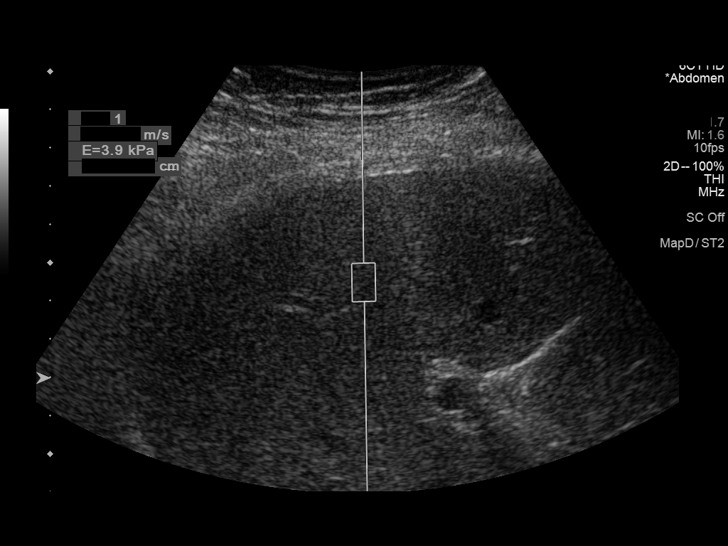
[im 9/15]
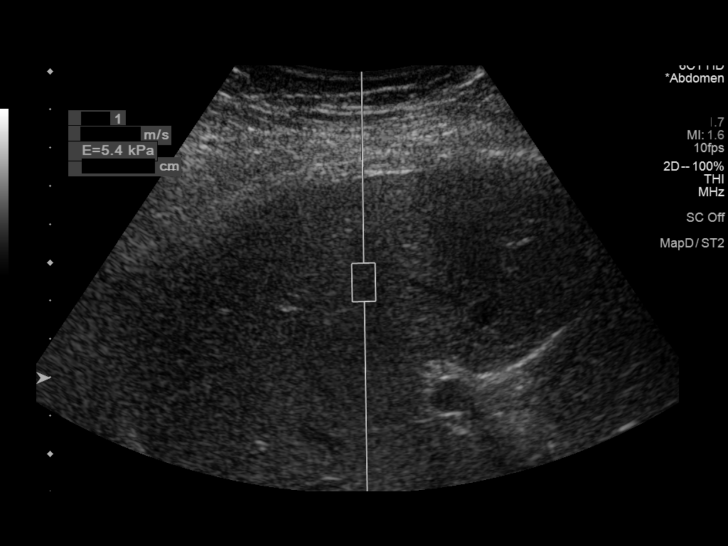
[im 10/15]
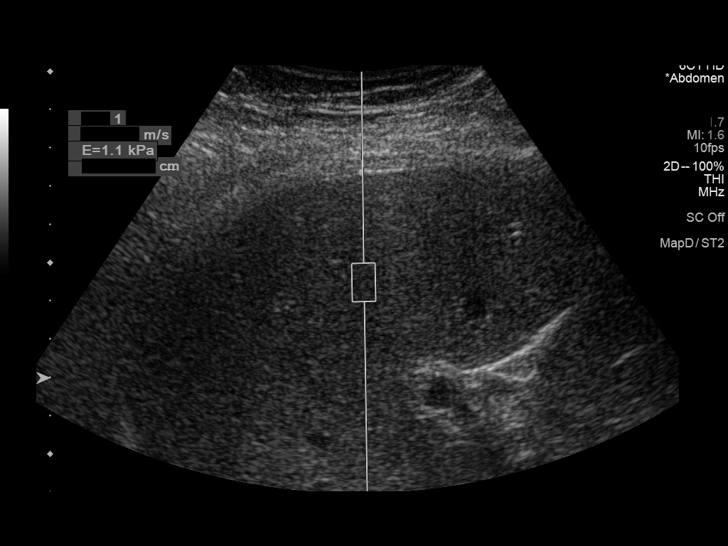
[im 11/15]
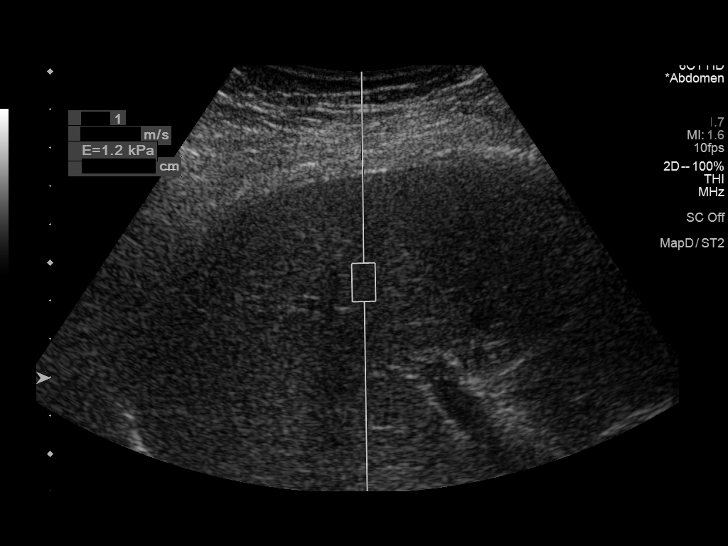
[im 13/15]
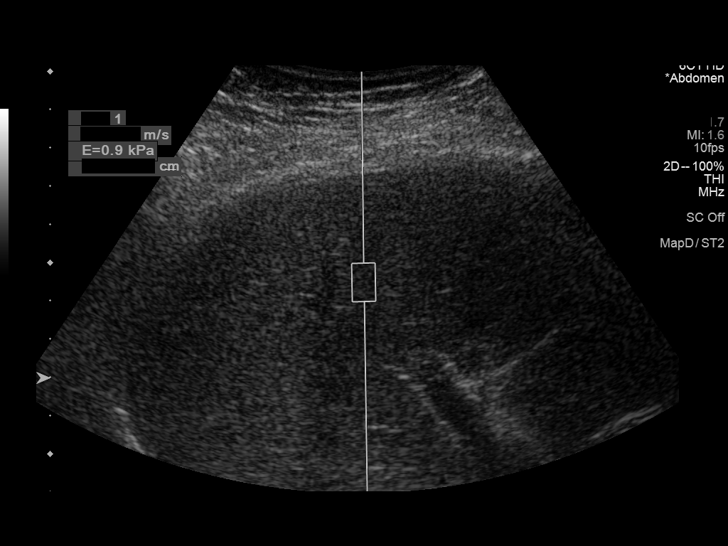
[im 14/15]
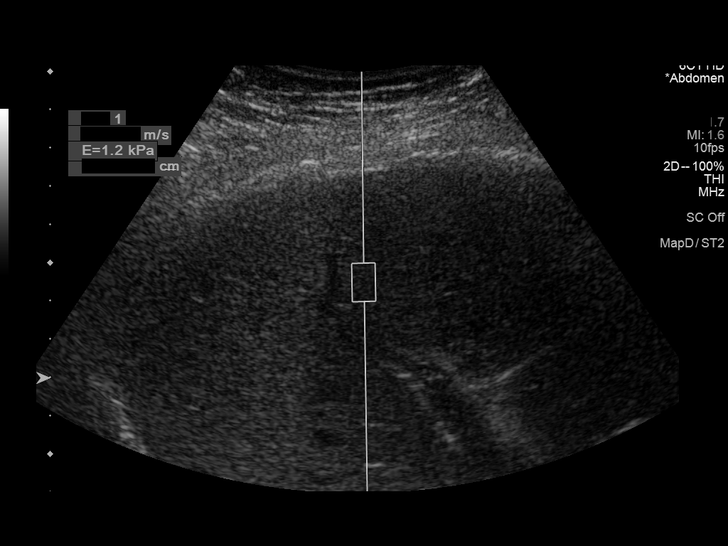
[im 15/15]
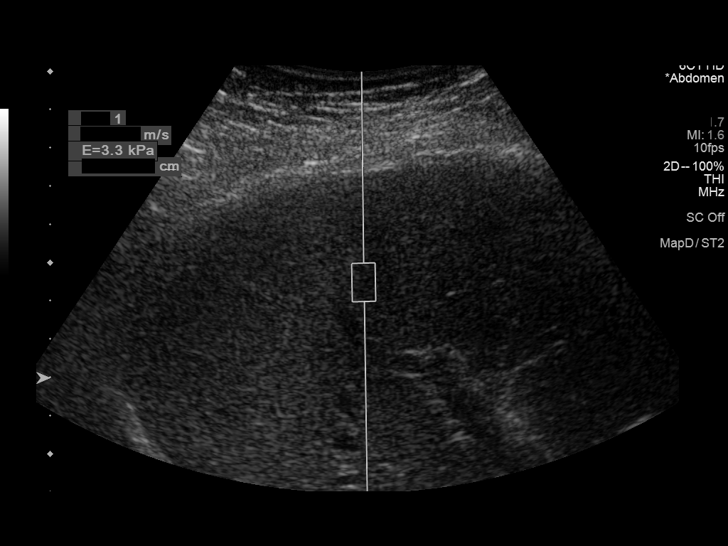

[13 of 15 positions shown; findings below may reference images not displayed]

FINDINGS: ULTRASOUND ABDOMEN LIMITED RIGHT UPPER QUADRANT

Gallbladder:

No gallstones or wall thickening visualized. No sonographic Murphy
sign noted.

Common bile duct:

Diameter: 3 mm

Liver:

No focal lesion identified. Within normal limits in parenchymal
echogenicity.

ULTRASOUND HEPATIC ELASTOGRAPHY

Device: Siemens Helix VTQ

Patient position: Oblique

Transducer [id number]

Number of measurements: 10

Hepatic segment:  8

Median velocity:   0.64  m/sec

IQR:

IQR/Median velocity ratio:

Corresponding Metavir fibrosis score:  F0/F1

Risk of fibrosis: Minimal

Limitations of exam: None

Pertinent findings noted on other imaging exams:  None

Please note that abnormal shear wave velocities may also be
identified in clinical settings other than with hepatic fibrosis,
such as: acute hepatitis, elevated right heart and central venous
pressures including use of beta blockers, Smokealien disease
(Emelyn), infiltrative processes such as
mastocytosis/amyloidosis/infiltrative tumor, extrahepatic
cholestasis, in the post-prandial state, and liver transplantation.
Correlation with patient history, laboratory data, and clinical
condition recommended.
IMPRESSION: ULTRASOUND ABDOMEN:
Negative limited right upper quadrant ultrasound. No hepatobiliary
abnormality identified.

ULTRASOUND HEPATIC ELASTOGRAHY:

Median hepatic shear wave velocity is calculated at 0.64 m/sec.

Corresponding Metavir fibrosis score is  F0/F1.

Risk of fibrosis is Minimal.

Follow-up: None required

## 2018-12-20 IMAGING — CR DG CHEST 2V
1 series · 2 of 2 positions shown · non-contrast
Comparison: 10/24/2016 .

CLINICAL DATA: Shortness of breath.  Difficulty breathing .

EXAM:
CHEST  2 VIEW

[Series 1: dg chest 2 view · 0.14mm/px · 2 of 2 slices shown]
[im 1/2]
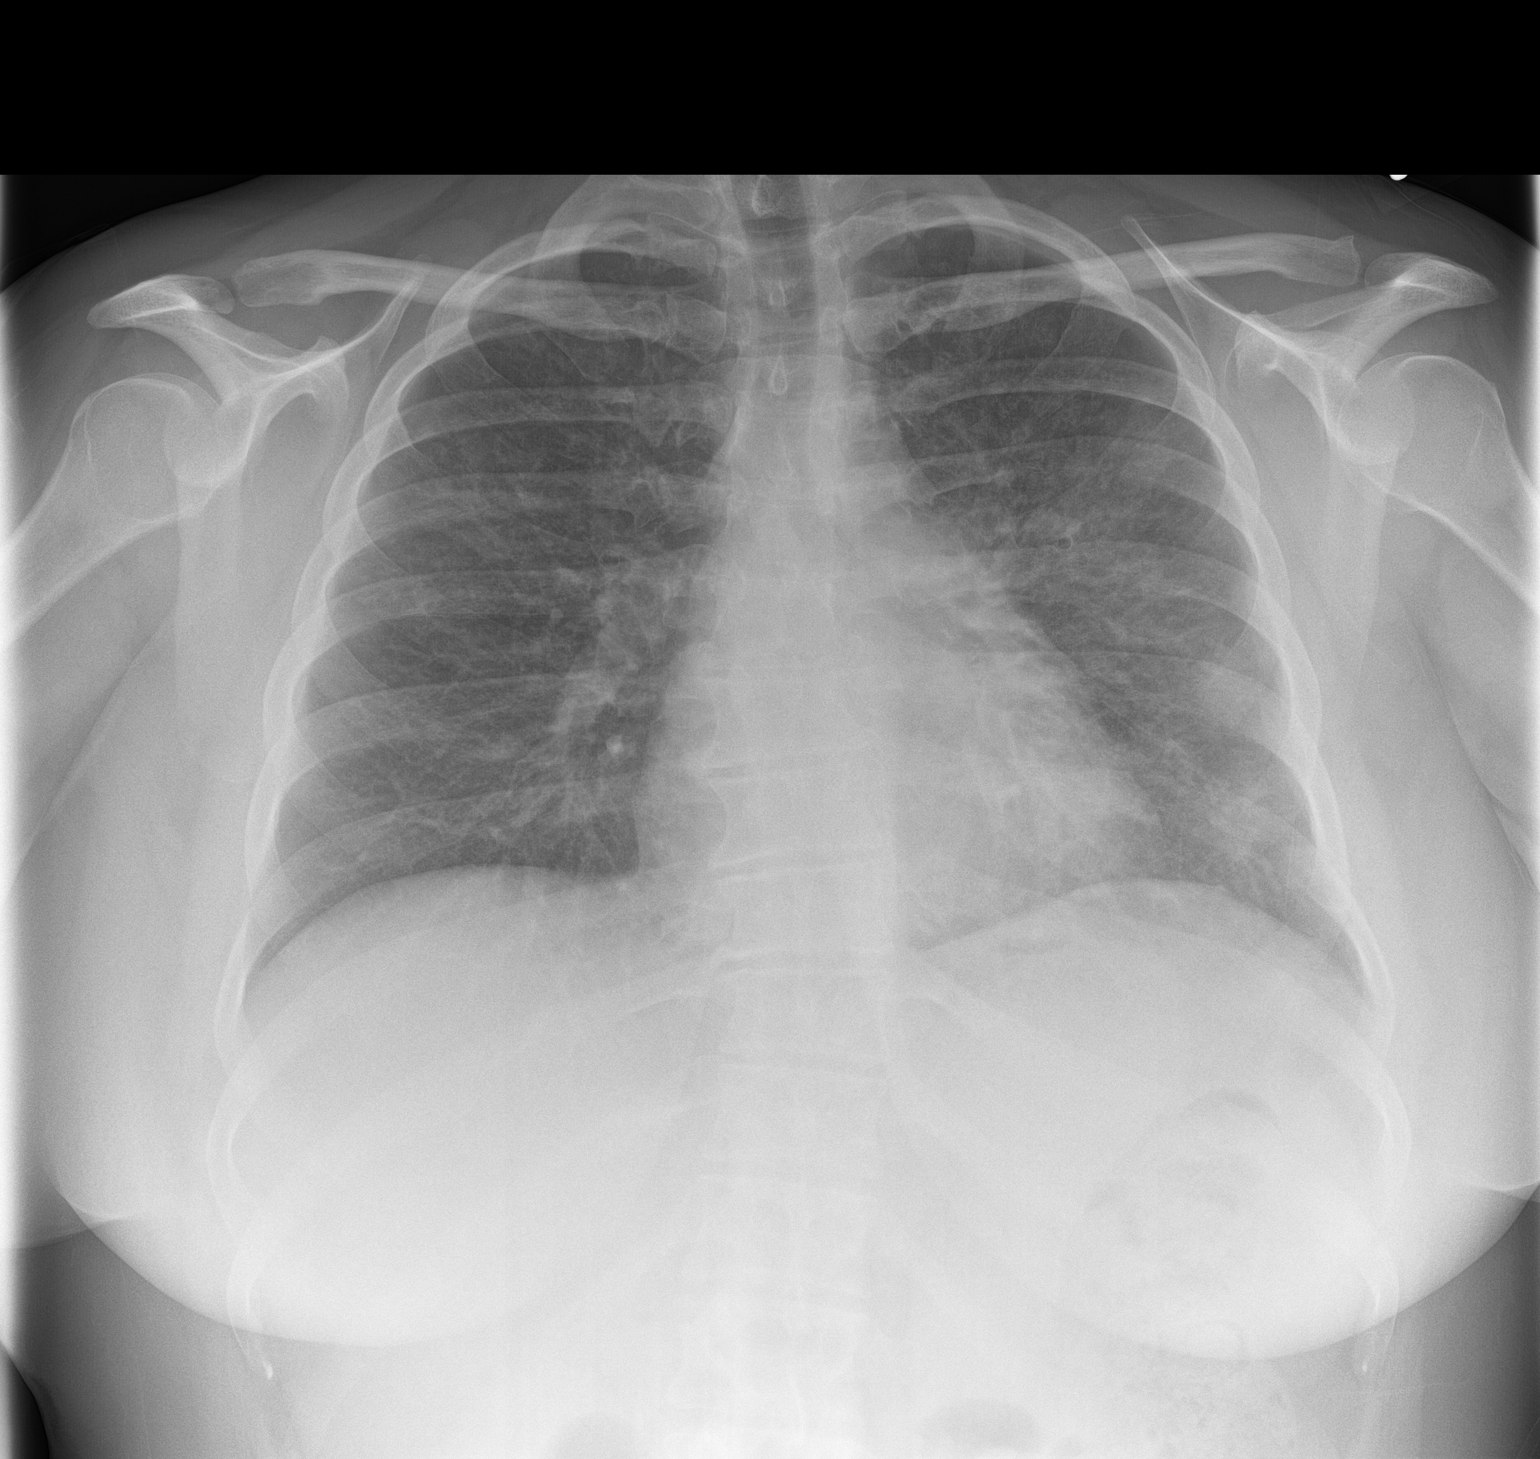
[im 2/2]
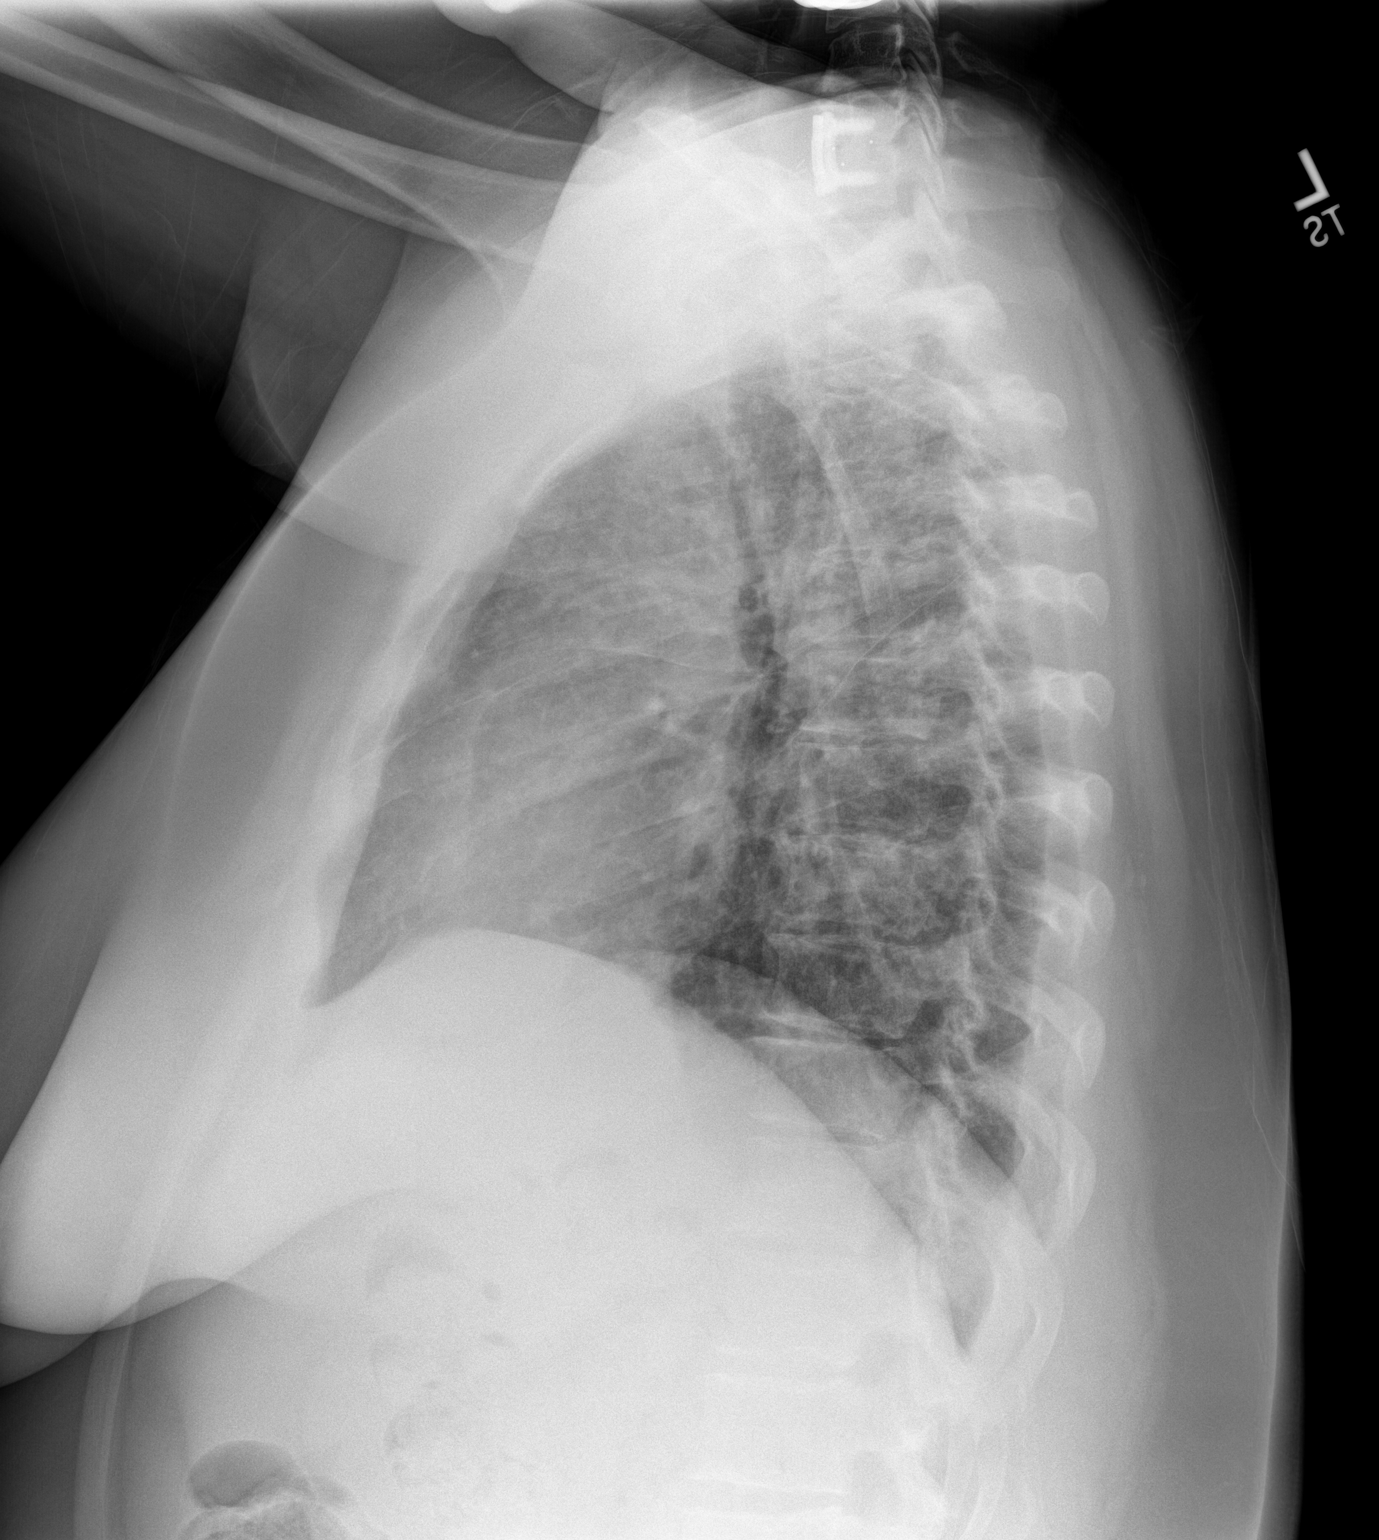

[2 of 2 positions shown; findings below may reference images not displayed]

FINDINGS: Mediastinum hilar structures normal. Heart size normal. Diffuse left
lung infiltrate. No pleural effusion or pneumothorax. No acute bony
abnormality. Cervical spine fusion .
IMPRESSION: Diffuse left lung infiltrate consistent with pneumonia .

## 2018-12-21 IMAGING — DX DG CHEST 1V PORT
1 series · 1 of 1 positions shown · non-contrast
Comparison: 11/05/2014 CXR

CLINICAL DATA: Increasing dyspnea this evening. History of COPD and
CHF. Cough with clear sputum.

EXAM:
PORTABLE CHEST 1 VIEW

[chest ap]
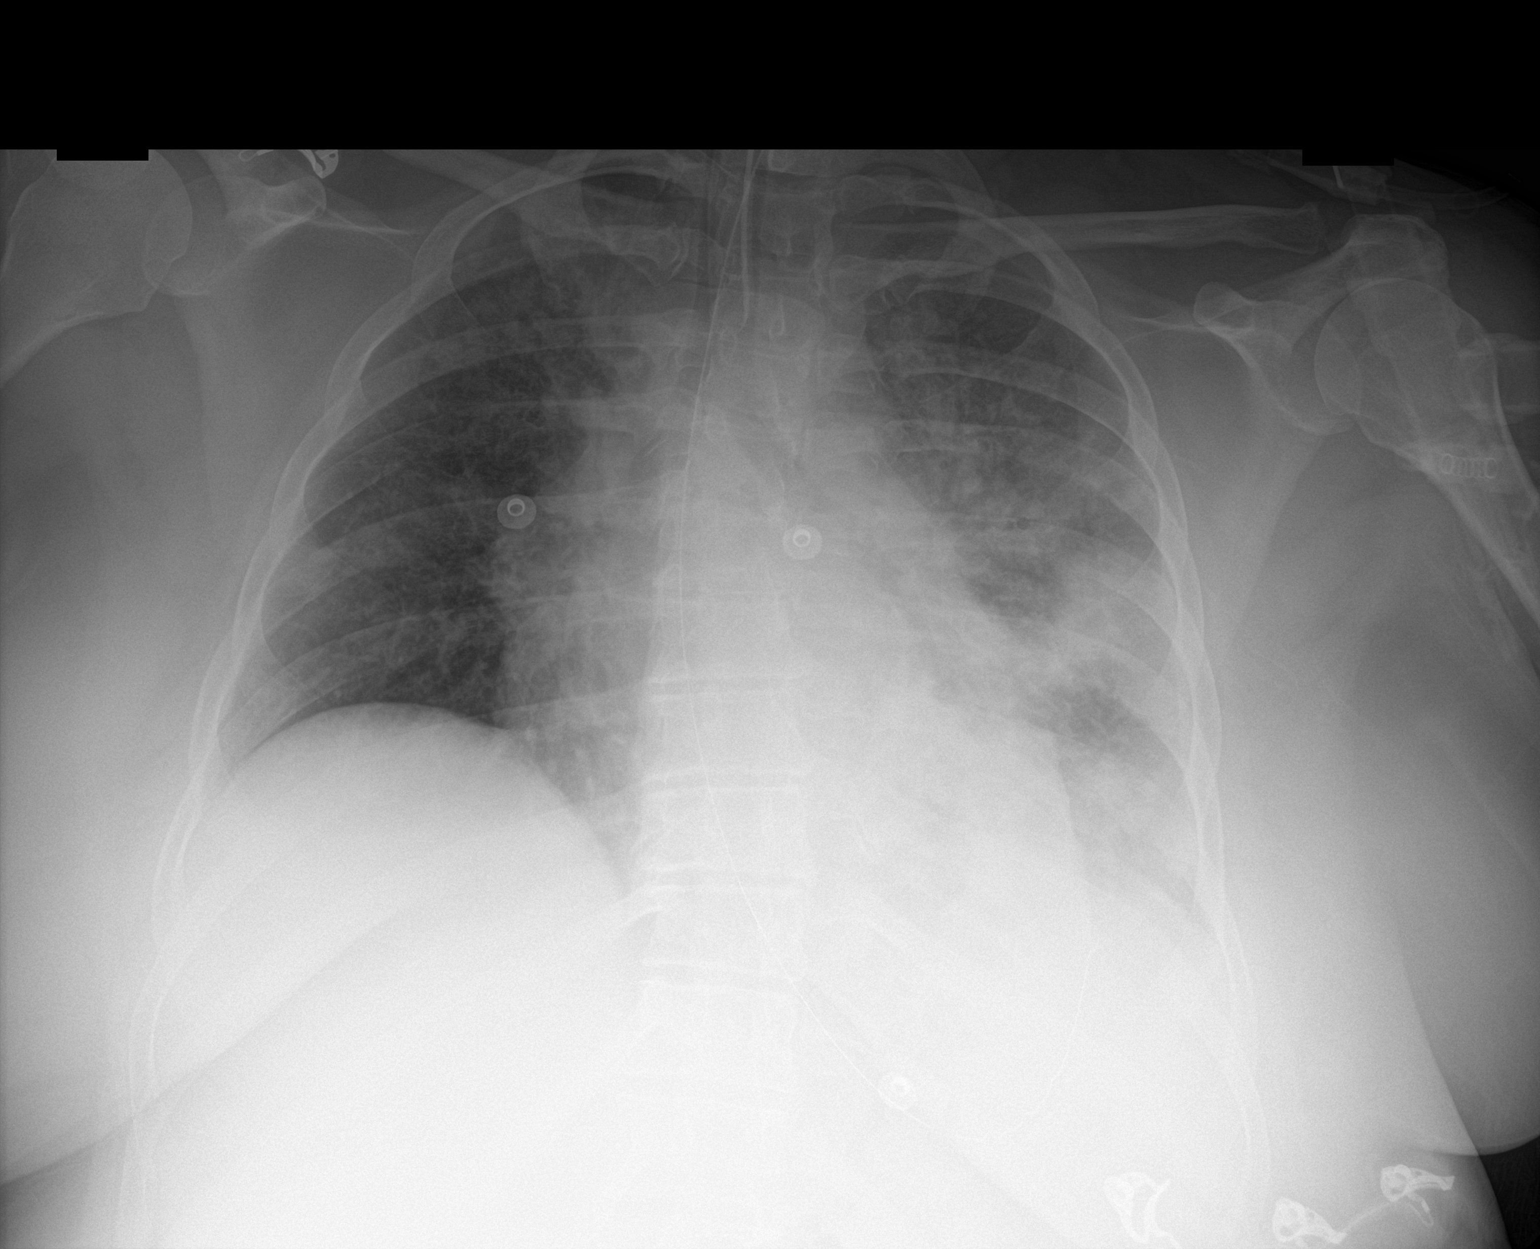

[1 of 1 positions shown; findings below may reference images not displayed]

FINDINGS: Borderline cardiomegaly. Low-lying endotracheal tube tip 2.1 cm
above the carina. Pullback 1 cm suggested. ACDF of the visualized
cervical spine. Gastric tube is seen coiled beneath the left
hemidiaphragm in the expected location of the stomach. Patchy
confluent airspace opacities are noted in the left mid and lower
lung consistent with pneumonia. Superimposed CHF is seen. No
pneumothorax or significant pleural fluid. No acute nor suspicious
osseous abnormality.
IMPRESSION: 1. Left mid and lower lung airspace opacities suspicious for
pneumonia. Superimposed CHF also suspected.
2. Low-lying endotracheal tube approximately 2.1 cm above the
carina. Pullback 1 cm suggested.
3. Gastric tube in place within the expected location of the
stomach.

## 2019-03-11 ENCOUNTER — Emergency Department: Payer: Self-pay

## 2019-03-11 ENCOUNTER — Other Ambulatory Visit: Payer: Self-pay

## 2019-03-11 DIAGNOSIS — Z5321 Procedure and treatment not carried out due to patient leaving prior to being seen by health care provider: Secondary | ICD-10-CM | POA: Insufficient documentation

## 2019-03-11 LAB — BASIC METABOLIC PANEL
Anion gap: 7 (ref 5–15)
BUN: 12 mg/dL (ref 6–20)
CO2: 26 mmol/L (ref 22–32)
Calcium: 8.8 mg/dL — ABNORMAL LOW (ref 8.9–10.3)
Chloride: 104 mmol/L (ref 98–111)
Creatinine, Ser: 0.79 mg/dL (ref 0.44–1.00)
GFR calc Af Amer: >60 mL/min (ref >60)
GFR calc non Af Amer: >60 mL/min (ref >60)
Glucose, Bld: 112 mg/dL — ABNORMAL HIGH (ref 70–99)
Potassium: 3.7 mmol/L (ref 3.5–5.1)
Sodium: 137 mmol/L (ref 135–145)

## 2019-03-11 LAB — CBC
HCT: 38.5 % (ref 36.0–46.0)
Hemoglobin: 13.1 g/dL (ref 12.0–15.0)
MCH: 31 pg (ref 26.0–34.0)
MCHC: 34 g/dL (ref 30.0–36.0)
MCV: 91 fL (ref 80.0–100.0)
Platelets: 258 10*3/uL (ref 150–400)
RBC: 4.23 MIL/uL (ref 3.87–5.11)
RDW: 13.6 % (ref 11.5–15.5)
WBC: 17.6 10*3/uL — ABNORMAL HIGH (ref 4.0–10.5)
nRBC: 0 % (ref 0.0–0.2)

## 2019-03-11 LAB — TROPONIN I (HIGH SENSITIVITY): Troponin I (High Sensitivity): 4 ng/L (ref <18)

## 2019-03-11 NOTE — ED Triage Notes (Addendum)
Patient reports feeling short of breath for a week.  Now with increased shortness of breath and chest pain.  Patient with audible wheezing heard on expiration.

## 2019-03-12 ENCOUNTER — Emergency Department
Admission: EM | Admit: 2019-03-12 | Discharge: 2019-03-12 | Discharge status: Against Medical Advice | Payer: Self-pay | Attending: Emergency Medicine | Admitting: Emergency Medicine

## 2019-03-12 MED ORDER — GENTAK 0.3 % OP SOLN
1.00 | OPHTHALMIC | Status: DC
Start: ? — End: 2019-03-12

## 2019-03-12 NOTE — ED Notes (Signed)
Patient resting with eyes closed. No acute distress noted.

## 2019-03-12 NOTE — ED Notes (Signed)
Unable to locate patient to give an update. 

## 2019-04-27 ENCOUNTER — Emergency Department: Payer: Self-pay

## 2019-04-27 ENCOUNTER — Emergency Department
Admission: EM | Admit: 2019-04-27 | Discharge: 2019-04-27 | Disposition: A | Discharge status: Home / Self Care | Payer: Self-pay | Attending: Emergency Medicine | Admitting: Emergency Medicine

## 2019-04-27 ENCOUNTER — Other Ambulatory Visit: Payer: Self-pay

## 2019-04-27 DIAGNOSIS — I509 Heart failure, unspecified: Secondary | ICD-10-CM | POA: Insufficient documentation

## 2019-04-27 DIAGNOSIS — M545 Low back pain, unspecified: Secondary | ICD-10-CM

## 2019-04-27 DIAGNOSIS — Z79899 Other long term (current) drug therapy: Secondary | ICD-10-CM | POA: Insufficient documentation

## 2019-04-27 DIAGNOSIS — J45909 Unspecified asthma, uncomplicated: Secondary | ICD-10-CM | POA: Insufficient documentation

## 2019-04-27 DIAGNOSIS — J449 Chronic obstructive pulmonary disease, unspecified: Secondary | ICD-10-CM | POA: Insufficient documentation

## 2019-04-27 DIAGNOSIS — F17210 Nicotine dependence, cigarettes, uncomplicated: Secondary | ICD-10-CM | POA: Insufficient documentation

## 2019-04-27 MED ORDER — IBUPROFEN 400 MG PO TABS: 400.0000 mg | ORAL_TABLET | Freq: Four times a day (QID) | ORAL | 0 refills | Status: DC | PRN

## 2019-04-27 MED ORDER — LIDOCAINE 5 % EX PTCH
1.0000 | MEDICATED_PATCH | CUTANEOUS | Status: DC
  Administered 2019-04-27: 17:00:00 1 via TRANSDERMAL
  Filled 2019-04-27: qty 1

## 2019-04-27 MED ORDER — METHYLPREDNISOLONE SODIUM SUCC 125 MG IJ SOLR
125.0000 mg | Freq: Once | INTRAMUSCULAR | Status: AC
  Administered 2019-04-27: 125 mg via INTRAMUSCULAR
  Filled 2019-04-27: qty 2

## 2019-04-27 MED ORDER — CYCLOBENZAPRINE HCL 5 MG PO TABS: ORAL_TABLET | ORAL | 0 refills | Status: DC

## 2019-04-27 MED ORDER — KETOROLAC TROMETHAMINE 30 MG/ML IJ SOLN
30.0000 mg | Freq: Once | INTRAMUSCULAR | Status: AC
  Administered 2019-04-27: 17:00:00 30 mg via INTRAMUSCULAR
  Filled 2019-04-27: qty 1

## 2019-04-27 MED ORDER — PREDNISONE 10 MG PO TABS: ORAL_TABLET | ORAL | 0 refills | Status: DC

## 2019-04-27 MED ORDER — ORPHENADRINE CITRATE 30 MG/ML IJ SOLN
60.0000 mg | Freq: Two times a day (BID) | INTRAMUSCULAR | Status: DC
  Administered 2019-04-27: 60 mg via INTRAMUSCULAR
  Filled 2019-04-27: qty 2

## 2019-04-27 MED ORDER — LIDOCAINE 5 % EX PTCH: 1.0000 | MEDICATED_PATCH | CUTANEOUS | 0 refills | Status: DC

## 2019-04-27 MED ORDER — OXYCODONE HCL 5 MG PO TABS
5.0000 mg | ORAL_TABLET | Freq: Once | ORAL | Status: AC
  Administered 2019-04-27: 17:00:00 5 mg via ORAL
  Filled 2019-04-27: qty 1

## 2019-04-27 MED ORDER — OXYCODONE HCL 5 MG PO TABS: 5.0000 mg | ORAL_TABLET | Freq: Three times a day (TID) | ORAL | 0 refills | Status: AC | PRN

## 2019-04-27 NOTE — ED Provider Notes (Signed)
[name]  ____________________________________________  Time seen: Approximately 4:15 PM  I have reviewed the triage vital signs and the nursing notes.   HISTORY  Chief Complaint Back Pain    HPI Jennifer Vasquez is a 33 y.o. female presents to emergency department for evaluation of low back pain for 2 days.  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  Pain does slightly radiate into her left hip: this is normal for her back pain.  No numbness, tingling.  No bowel or bladder dysfunction or saddle anesthesias.  Patient has a history of herniated disc.  She was seeing neurosurgery and was recommended to have surgery but was too scared to have the surgery so she did not.  She then lost her insurance and was unable to follow-up again.  She was seeing [facility] in [place].  She currently has charity care through [facility].  She has an appointment with her doctor on Thursday.  Past Medical History:  Diagnosis Date  . Acute carpal tunnel syndrome 10/10/2014  . Anxiety state 09/03/2009  . Asthma   . Bipolar disorder ([place]) 02/09/2013  . CHF (congestive heart failure) ([place])   . COPD (chronic obstructive pulmonary disease) ([place])   . DDD (degenerative disc disease), cervical 11/13/2014  . DDD (degenerative disc disease), lumbar 11/13/2014  . Hepatitis C   . Lumbosacral radiculopathy 11/13/2014  . Tobacco use disorder 09/06/2012    Patient Active Problem List   Diagnosis Date Noted  . Sepsis due to pneumonia ([place]) 07/05/2018  . Community acquired pneumonia of left lung   . Respiratory failure ([place]) 11/05/2016  . DDD (degenerative disc disease), lumbar 11/13/2014  . DDD (degenerative disc disease), cervical 11/13/2014  . Lumbosacral radiculopathy 11/13/2014  . Acute carpal tunnel syndrome 10/10/2014  . Chronic pain 03/02/2013  . Depression 03/02/2013  . Bipolar disorder ([place]) 02/09/2013  . Hepatitis C  12/18/2012  . Asthma 11/23/2012  . Neck pain 11/23/2012  . Torticollis 11/23/2012  . Tobacco use disorder 09/06/2012  . Anxiety state 09/03/2009    Past Surgical History:  Procedure Laterality Date  . CERVICAL DISC ARTHROPLASTY    . TONSILLECTOMY    . TUBAL LIGATION      Prior to Admission medications   Medication Sig Start Date End Date Taking? Authorizing Provider  albuterol (PROVENTIL HFA;VENTOLIN HFA) 108 (90 Base) MCG/ACT inhaler Inhale 2 puffs into the lungs every 6 (six) hours as needed for wheezing or shortness of breath. 01/17/18   Darel Hong, MD  citalopram (CELEXA) 20 MG tablet Take 1 tablet by mouth daily. 11/25/16   [provider]  cyclobenzaprine (FLEXERIL) 5 MG tablet Take 1-2 tablets 3 times daily as needed 04/27/19   Laban Emperor, PA-C  Fluticasone-Salmeterol (ADVAIR DISKUS) 250-50 MCG/DOSE AEPB 1 inhalation 2 (two) times daily. 04/01/14   [provider]  ibuprofen (ADVIL) 400 MG tablet Take 1 tablet (400 mg total) by mouth every 6 (six) hours as needed. 04/27/19   Laban Emperor, PA-C  ipratropium (ATROVENT) 0.02 % nebulizer solution Take 2.5 mLs (0.5 mg total) by nebulization 4 (four) times daily. 10/29/17   Triplett, Cari B, FNP  levothyroxine (SYNTHROID, LEVOTHROID) 25 MCG tablet Take 25 mcg by mouth daily before breakfast.    [provider]  lidocaine (LIDODERM) 5 % Place 1 patch onto the skin daily. Remove & Discard patch within 12 hours or as directed by MD 04/27/19   Earleen Newport,  Morrie Sheldon, PA-C  loratadine (CLARITIN) 10 MG tablet Take 10 mg by mouth daily.    [provider]  nystatin (MYCOSTATIN) 100000 UNIT/ML suspension Use as directed 5 mLs (500,000 Units total) in the mouth or throat 4 (four) times daily. 11/15/16   Milagros Loll, MD  oxyCODONE (ROXICODONE) 5 MG immediate release tablet Take 1 tablet (5 mg total) by mouth every 8 (eight) hours as needed for up to 2 days. 04/27/19 04/29/19  Enid Derry, PA-C  predniSONE  (DELTASONE) 10 MG tablet Take 6 tablets day 1, take 5 tablets day 2, take 4 tablets day 3, take 3 tablets day 4, take 2 tablets day 5, take 1 tablet day 6 04/27/19   Enid Derry, PA-C  tiotropium (SPIRIVA) 18 MCG inhalation capsule Place 18 mcg into inhaler and inhale daily.    [provider]    Allergies Trazodone and nefazodone, Acetaminophen, Amoxicillin-pot clavulanate, Gabapentin, Tetanus toxoids, Tramadol, and Shellfish allergy  Family History  Problem Relation Age of Onset  . Arthritis Mother   . Asthma Mother   . Diabetes Mother   . Hyperlipidemia Mother   . Vision loss Mother   . Alcohol abuse Father   . Asthma Father   . Diabetes Father   . Heart disease Father   . Depression Father   . COPD Father   . Cancer Father   . Hypertension Father   . Kidney disease Father   . Vision loss Father     Social History Social History   Tobacco Use  . Smoking status: Current Some Day Smoker    Packs/day: 0.50    Types: Cigarettes  . Smokeless tobacco: Never Used  Substance Use Topics  . Alcohol use: Not Currently    Alcohol/week: 0.0 standard drinks  . Drug use: No     Review of Systems  Cardiovascular: No chest pain. Respiratory: No SOB. Gastrointestinal: No abdominal pain.  No nausea, no vomiting.  Musculoskeletal: Positive for back pain. Skin: Negative for rash, abrasions, lacerations, ecchymosis. Neurological: Negative for headaches, numbness or tingling   ____________________________________________   PHYSICAL EXAM:  VITAL SIGNS: ED Triage Vitals  Enc Vitals Group     BP 04/27/19 1520 (!) 144/100     Pulse Rate 04/27/19 1520 (!) 115     Resp 04/27/19 1520 16     Temp 04/27/19 1520 98.9 F (37.2 C)     Temp Source 04/27/19 1520 Oral     SpO2 04/27/19 1520 97 %     Weight 04/27/19 1518 200 lb (90.7 kg)     Height 04/27/19 1518 5\' 1"  (1.549 m)     Head Circumference --      Peak Flow --      Pain Score 04/27/19 1517 10     Pain Loc --       Pain Edu? --      Excl. in GC? --      Constitutional: Alert and oriented. Well appearing and in no acute distress. Eyes: Conjunctivae are normal. PERRL. EOMI. Head: Atraumatic. ENT:      Ears:      Nose: No congestion/rhinnorhea.      Mouth/Throat: Mucous membranes are moist.  Neck: No stridor.  Cardiovascular: Normal rate, regular rhythm.  Good peripheral circulation. Respiratory: Normal respiratory effort without tachypnea or retractions. Lungs CTAB. Good air entry to the bases with no decreased or absent breath sounds. Gastrointestinal: Bowel sounds 4 quadrants. Soft and nontender to palpation. No guarding or rigidity. No palpable  masses. No distention.  Musculoskeletal: Full range of motion to all extremities. No gross deformities appreciated.  Tenderness to palpation to lumbar spine and left SI joint.  Strength equal in lower extremities bilaterally.  No foot drop. Neurologic:  Normal speech and language. No gross focal neurologic deficits are appreciated.  Skin:  Skin is warm, dry and intact. No rash noted. Psychiatric: Mood and affect are normal. Speech and behavior are normal. Patient exhibits appropriate insight and judgement.   ____________________________________________   LABS (all labs ordered are listed, but only abnormal results are displayed)  Labs Reviewed  URINALYSIS, COMPLETE (UACMP) WITH MICROSCOPIC  POC URINE PREG, ED   ____________________________________________  EKG   ____________________________________________  RADIOLOGY Lexine Baton, personally viewed and evaluated these images (plain radiographs) as part of my medical decision making, as well as reviewing the written report by the radiologist.  Dg Lumbar Spine 2-3 Views  Result Date: 04/27/2019 CLINICAL DATA:  Back pain radiating to left leg for 2 days EXAM: LUMBAR SPINE - 2-3 VIEW COMPARISON:  Radiograph February 24, 2015 FINDINGS: Mild dextro rotatory curvature of the lumbar spine.  Five lumbar type vertebral bodies are seen. No acute fracture or vertebral body height loss. Mild diffuse intervertebral disc height loss with sclerotic endplate changes and posterior facet arthropathy, most pronounced in the lower lumbar levels. 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 IMPRESSION: 1. No acute osseous abnormality. 2. Mild diffuse degenerative changes of the lumbar spine. No acute osseous abnormality. 3. 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 4. Dextrocurvature of the spine. Electronically Signed   By: Kreg Shropshire M.D.   On: 04/27/2019 17:42    ____________________________________________    PROCEDURES  Procedure(s) performed:    Procedures    Medications  orphenadrine (NORFLEX) injection 60 mg (60 mg Intramuscular Given 04/27/19 1716)  lidocaine (LIDODERM) 5 % 1 patch (1 patch Transdermal Patch Applied 04/27/19 1724)  ketorolac (TORADOL) 30 MG/ML injection 30 mg (30 mg Intramuscular Given 04/27/19 1717)  oxyCODONE (Oxy IR/ROXICODONE) immediate release tablet 5 mg (5 mg Oral Given 04/27/19 1723)  methylPREDNISolone sodium succinate (SOLU-MEDROL) 125 mg/2 mL injection 125 mg (125 mg Intramuscular Given 04/27/19 1842)     ____________________________________________   INITIAL IMPRESSION / ASSESSMENT AND PLAN / ED COURSE  Pertinent labs & imaging results that were available during my care of the patient were reviewed by me and considered in my medical decision making (see chart for details).  Review of the [place] CSRS was performed in accordance of the [facility] prior to dispensing any controlled drugs.     Patient presented to emergency department for evaluation of acute on chronic back pain.  Vital signs and exam are reassuring.  X-ray negative for acute bony abnormalities.  Findings were discussed with the patient.  Pain improved with  Toradol, Norflex, oxycodone.  She is up walking around.  Patient will be discharged home with prescriptions for Flexeril, prednisone, Motrin, short course of Percocet, Lidoderm patch. Patient is to follow up with neurosurgery and primary care as directed. Patient is given ED precautions to return to the ED for any worsening or new symptoms.     ____________________________________________  FINAL CLINICAL IMPRESSION(S) / ED DIAGNOSES  Final diagnoses:  Acute left-sided low back pain without sciatica      NEW MEDICATIONS STARTED DURING THIS VISIT:  ED Discharge Orders  Ordered    oxyCODONE (ROXICODONE) 5 MG immediate release tablet  Every 8 hours PRN     04/27/19 1836    cyclobenzaprine (FLEXERIL) 5 MG tablet     04/27/19 1836    lidocaine (LIDODERM) 5 %  Every 24 hours     04/27/19 1836    predniSONE (DELTASONE) 10 MG tablet     04/27/19 1836    ibuprofen (ADVIL) 400 MG tablet  Every 6 hours PRN     04/27/19 1836              This chart was dictated using voice recognition software/Dragon. Despite best efforts to proofread, errors can occur which can change the meaning. Any change was purely unintentional.    Enid DerryWagner, Luccia Reinheimer, PA-C 04/27/19 2030    Emily FilbertWilliams, Jonathan E, MD 04/27/19 2158

## 2019-04-27 NOTE — ED Triage Notes (Signed)
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

## 2019-04-27 NOTE — ED Notes (Signed)
WC UDS completed and taken to lab by this tech.

## 2019-04-27 NOTE — ED Notes (Signed)
Pt unable to urinate at this time.  

## 2019-06-01 ENCOUNTER — Emergency Department
Admission: EM | Admit: 2019-06-01 | Discharge: 2019-06-01 | Disposition: A | Discharge status: Home / Self Care | Payer: Self-pay | Attending: Emergency Medicine | Admitting: Emergency Medicine

## 2019-06-01 ENCOUNTER — Other Ambulatory Visit: Payer: Self-pay

## 2019-06-01 DIAGNOSIS — Z5321 Procedure and treatment not carried out due to patient leaving prior to being seen by health care provider: Secondary | ICD-10-CM | POA: Insufficient documentation

## 2019-06-01 LAB — COMPREHENSIVE METABOLIC PANEL
ALT: 18 U/L (ref 0–44)
AST: 18 U/L (ref 15–41)
Albumin: 4.1 g/dL (ref 3.5–5.0)
Alkaline Phosphatase: 79 U/L (ref 38–126)
Anion gap: 12 (ref 5–15)
BUN: 12 mg/dL (ref 6–20)
CO2: 24 mmol/L (ref 22–32)
Calcium: 9.4 mg/dL (ref 8.9–10.3)
Chloride: 101 mmol/L (ref 98–111)
Creatinine, Ser: 0.66 mg/dL (ref 0.44–1.00)
GFR calc Af Amer: >60 mL/min (ref >60)
GFR calc non Af Amer: >60 mL/min (ref >60)
Glucose, Bld: 102 mg/dL — ABNORMAL HIGH (ref 70–99)
Potassium: 3.8 mmol/L (ref 3.5–5.1)
Sodium: 137 mmol/L (ref 135–145)
Total Bilirubin: 0.6 mg/dL (ref 0.3–1.2)
Total Protein: 7.5 g/dL (ref 6.5–8.1)

## 2019-06-01 LAB — CBC
HCT: 41.4 % (ref 36.0–46.0)
Hemoglobin: 14.6 g/dL (ref 12.0–15.0)
MCH: 30.1 pg (ref 26.0–34.0)
MCHC: 35.3 g/dL (ref 30.0–36.0)
MCV: 85.4 fL (ref 80.0–100.0)
Platelets: 288 10*3/uL (ref 150–400)
RBC: 4.85 MIL/uL (ref 3.87–5.11)
RDW: 12.6 % (ref 11.5–15.5)
WBC: 15.2 10*3/uL — ABNORMAL HIGH (ref 4.0–10.5)
nRBC: 0 % (ref 0.0–0.2)

## 2019-06-01 LAB — LIPASE, BLOOD: Lipase: 26 U/L (ref 11–51)

## 2019-06-01 MED ORDER — FENTANYL CITRATE (PF) 100 MCG/2ML IJ SOLN
50.0000 ug | INTRAMUSCULAR | Status: AC | PRN
  Administered 2019-06-01 (×2): 50 ug via INTRAVENOUS
  Filled 2019-06-01 (×2): qty 2

## 2019-06-01 MED ORDER — SODIUM CHLORIDE 0.9% FLUSH: 3.0000 mL | Freq: Once | INTRAVENOUS | Status: DC

## 2019-06-01 NOTE — ED Triage Notes (Signed)
Pt to ED via EMS from home c/o left back pain over 1 week now moved to LLQ and to pelvis today.  Pt states stabbing pain, some hematuria.  Hx of kidney stones and feels similar.  Pt very tearful in triage in pain.

## 2019-06-01 NOTE — ED Notes (Signed)
Patient observed walking outside of waiting room.  Security officer followed patient to check to see if she still had in her IV.  Officer brought patient back to stat desk and patient requested that her IV be taken out because she was leaving.

## 2019-06-01 NOTE — ED Triage Notes (Signed)
Patient coming [facility] from home for urinary complaints. Patient c/o lower back pain radiating to vagina. Patient reports hx of kidney stones. Patient reports urinary urgency, with decreased urinary output. EMS vitals stable.

## 2019-09-21 ENCOUNTER — Emergency Department: Payer: Self-pay

## 2019-09-21 ENCOUNTER — Inpatient Hospital Stay
Admission: EM | Admit: 2019-09-21 | Discharge: 2019-09-21 | DRG: 193 | Discharge status: Against Medical Advice | Payer: Self-pay | Attending: Internal Medicine | Admitting: Internal Medicine

## 2019-09-21 ENCOUNTER — Other Ambulatory Visit: Payer: Self-pay

## 2019-09-21 ENCOUNTER — Encounter: Payer: Self-pay | Admitting: Emergency Medicine

## 2019-09-21 DIAGNOSIS — Z79899 Other long term (current) drug therapy: Secondary | ICD-10-CM

## 2019-09-21 DIAGNOSIS — Z91013 Allergy to seafood: Secondary | ICD-10-CM

## 2019-09-21 DIAGNOSIS — F17210 Nicotine dependence, cigarettes, uncomplicated: Secondary | ICD-10-CM | POA: Diagnosis present

## 2019-09-21 DIAGNOSIS — B1920 Unspecified viral hepatitis C without hepatic coma: Secondary | ICD-10-CM | POA: Diagnosis present

## 2019-09-21 DIAGNOSIS — Z20822 Contact with and (suspected) exposure to covid-19: Secondary | ICD-10-CM | POA: Diagnosis present

## 2019-09-21 DIAGNOSIS — J440 Chronic obstructive pulmonary disease with acute lower respiratory infection: Secondary | ICD-10-CM | POA: Diagnosis present

## 2019-09-21 DIAGNOSIS — J189 Pneumonia, unspecified organism: Principal | ICD-10-CM | POA: Diagnosis present

## 2019-09-21 DIAGNOSIS — Z888 Allergy status to other drugs, medicaments and biological substances status: Secondary | ICD-10-CM

## 2019-09-21 DIAGNOSIS — J9601 Acute respiratory failure with hypoxia: Secondary | ICD-10-CM | POA: Diagnosis present

## 2019-09-21 DIAGNOSIS — G40909 Epilepsy, unspecified, not intractable, without status epilepticus: Secondary | ICD-10-CM | POA: Diagnosis present

## 2019-09-21 DIAGNOSIS — Z79891 Long term (current) use of opiate analgesic: Secondary | ICD-10-CM

## 2019-09-21 DIAGNOSIS — M069 Rheumatoid arthritis, unspecified: Secondary | ICD-10-CM | POA: Diagnosis present

## 2019-09-21 DIAGNOSIS — G8929 Other chronic pain: Secondary | ICD-10-CM | POA: Diagnosis present

## 2019-09-21 DIAGNOSIS — Z825 Family history of asthma and other chronic lower respiratory diseases: Secondary | ICD-10-CM

## 2019-09-21 DIAGNOSIS — Z886 Allergy status to analgesic agent status: Secondary | ICD-10-CM

## 2019-09-21 DIAGNOSIS — E039 Hypothyroidism, unspecified: Secondary | ICD-10-CM | POA: Diagnosis present

## 2019-09-21 DIAGNOSIS — F319 Bipolar disorder, unspecified: Secondary | ICD-10-CM | POA: Diagnosis present

## 2019-09-21 DIAGNOSIS — Z8261 Family history of arthritis: Secondary | ICD-10-CM

## 2019-09-21 DIAGNOSIS — Z818 Family history of other mental and behavioral disorders: Secondary | ICD-10-CM

## 2019-09-21 DIAGNOSIS — J45901 Unspecified asthma with (acute) exacerbation: Secondary | ICD-10-CM

## 2019-09-21 DIAGNOSIS — Z8249 Family history of ischemic heart disease and other diseases of the circulatory system: Secondary | ICD-10-CM

## 2019-09-21 DIAGNOSIS — R079 Chest pain, unspecified: Secondary | ICD-10-CM

## 2019-09-21 DIAGNOSIS — Z79890 Hormone replacement therapy (postmenopausal): Secondary | ICD-10-CM

## 2019-09-21 DIAGNOSIS — Z885 Allergy status to narcotic agent status: Secondary | ICD-10-CM

## 2019-09-21 DIAGNOSIS — Z881 Allergy status to other antibiotic agents status: Secondary | ICD-10-CM

## 2019-09-21 DIAGNOSIS — F1910 Other psychoactive substance abuse, uncomplicated: Secondary | ICD-10-CM | POA: Diagnosis present

## 2019-09-21 DIAGNOSIS — I10 Essential (primary) hypertension: Secondary | ICD-10-CM | POA: Diagnosis present

## 2019-09-21 DIAGNOSIS — Z981 Arthrodesis status: Secondary | ICD-10-CM

## 2019-09-21 DIAGNOSIS — J188 Other pneumonia, unspecified organism: Secondary | ICD-10-CM | POA: Diagnosis present

## 2019-09-21 DIAGNOSIS — Z7951 Long term (current) use of inhaled steroids: Secondary | ICD-10-CM

## 2019-09-21 DIAGNOSIS — F411 Generalized anxiety disorder: Secondary | ICD-10-CM | POA: Diagnosis present

## 2019-09-21 LAB — FIBRINOGEN: Fibrinogen: 526 mg/dL — ABNORMAL HIGH (ref 210–475)

## 2019-09-21 LAB — HEPATIC FUNCTION PANEL
ALT: 12 U/L (ref 0–44)
AST: 24 U/L (ref 15–41)
Albumin: 3.4 g/dL — ABNORMAL LOW (ref 3.5–5.0)
Alkaline Phosphatase: 52 U/L (ref 38–126)
Bilirubin, Direct: <0.1 mg/dL (ref 0.0–0.2)
Total Bilirubin: 0.5 mg/dL (ref 0.3–1.2)
Total Protein: 6.1 g/dL — ABNORMAL LOW (ref 6.5–8.1)

## 2019-09-21 LAB — BASIC METABOLIC PANEL
Anion gap: 6 (ref 5–15)
BUN: 18 mg/dL (ref 6–20)
CO2: 29 mmol/L (ref 22–32)
Calcium: 8.4 mg/dL — ABNORMAL LOW (ref 8.9–10.3)
Chloride: 103 mmol/L (ref 98–111)
Creatinine, Ser: 0.78 mg/dL (ref 0.44–1.00)
GFR calc Af Amer: >60 mL/min (ref >60)
GFR calc non Af Amer: >60 mL/min (ref >60)
Glucose, Bld: 138 mg/dL — ABNORMAL HIGH (ref 70–99)
Potassium: 4.4 mmol/L (ref 3.5–5.1)
Sodium: 138 mmol/L (ref 135–145)

## 2019-09-21 LAB — CBC
HCT: 35.1 % — ABNORMAL LOW (ref 36.0–46.0)
Hemoglobin: 11.3 g/dL — ABNORMAL LOW (ref 12.0–15.0)
MCH: 32.7 pg (ref 26.0–34.0)
MCHC: 32.2 g/dL (ref 30.0–36.0)
MCV: 101.4 fL — ABNORMAL HIGH (ref 80.0–100.0)
Platelets: 230 10*3/uL (ref 150–400)
RBC: 3.46 MIL/uL — ABNORMAL LOW (ref 3.87–5.11)
RDW: 17.8 % — ABNORMAL HIGH (ref 11.5–15.5)
WBC: 19.2 10*3/uL — ABNORMAL HIGH (ref 4.0–10.5)
nRBC: 0 % (ref 0.0–0.2)

## 2019-09-21 LAB — LACTATE DEHYDROGENASE: LDH: 486 U/L — ABNORMAL HIGH (ref 98–192)

## 2019-09-21 LAB — URINE DRUG SCREEN, QUALITATIVE (ARMC ONLY)
Amphetamines, Ur Screen: NOT DETECTED
Barbiturates, Ur Screen: NOT DETECTED
Benzodiazepine, Ur Scrn: NOT DETECTED
Cannabinoid 50 Ng, Ur ~~LOC~~: POSITIVE — AB
Cocaine Metabolite,Ur ~~LOC~~: POSITIVE — AB
MDMA (Ecstasy)Ur Screen: NOT DETECTED
Methadone Scn, Ur: NOT DETECTED
Opiate, Ur Screen: POSITIVE — AB
Phencyclidine (PCP) Ur S: NOT DETECTED
Tricyclic, Ur Screen: POSITIVE — AB

## 2019-09-21 LAB — PROCALCITONIN: Procalcitonin: 0.2 ng/mL

## 2019-09-21 LAB — BRAIN NATRIURETIC PEPTIDE: B Natriuretic Peptide: 369 pg/mL — ABNORMAL HIGH (ref 0.0–100.0)

## 2019-09-21 LAB — LACTIC ACID, PLASMA: Lactic Acid, Venous: 1.2 mmol/L (ref 0.5–1.9)

## 2019-09-21 LAB — TROPONIN I (HIGH SENSITIVITY)
Troponin I (High Sensitivity): 42 ng/L — ABNORMAL HIGH (ref <18)
Troponin I (High Sensitivity): 38 ng/L — ABNORMAL HIGH (ref <18)

## 2019-09-21 LAB — POC SARS CORONAVIRUS 2 AG: SARS Coronavirus 2 Ag: NEGATIVE

## 2019-09-21 LAB — TRIGLYCERIDES: Triglycerides: 136 mg/dL (ref <150)

## 2019-09-21 LAB — RESPIRATORY PANEL BY RT PCR (FLU A&B, COVID)
Influenza A by PCR: NEGATIVE
Influenza B by PCR: NEGATIVE
SARS Coronavirus 2 by RT PCR: NEGATIVE

## 2019-09-21 LAB — FIBRIN DERIVATIVES D-DIMER (ARMC ONLY): Fibrin derivatives D-dimer (ARMC): 364.42 ng/mL (FEU) (ref 0.00–499.00)

## 2019-09-21 LAB — ETHANOL: Alcohol, Ethyl (B): <10 mg/dL (ref <10)

## 2019-09-21 LAB — C-REACTIVE PROTEIN: CRP: 22.4 mg/dL — ABNORMAL HIGH (ref <1.0)

## 2019-09-21 LAB — POCT PREGNANCY, URINE: Preg Test, Ur: NEGATIVE

## 2019-09-21 LAB — HCG, QUANTITATIVE, PREGNANCY: hCG, Beta Chain, Quant, S: <1 m[IU]/mL (ref <5)

## 2019-09-21 LAB — FERRITIN: Ferritin: 133 ng/mL (ref 11–307)

## 2019-09-21 MED ORDER — ALBUTEROL SULFATE (2.5 MG/3ML) 0.083% IN NEBU: 2.5000 mg | INHALATION_SOLUTION | RESPIRATORY_TRACT | Status: DC | PRN

## 2019-09-21 MED ORDER — IPRATROPIUM-ALBUTEROL 0.5-2.5 (3) MG/3ML IN SOLN: 3.0000 mL | RESPIRATORY_TRACT | Status: DC | PRN

## 2019-09-21 MED ORDER — ALBUTEROL SULFATE HFA 108 (90 BASE) MCG/ACT IN AERS: 2.0000 | INHALATION_SPRAY | Freq: Four times a day (QID) | RESPIRATORY_TRACT | Status: DC | PRN

## 2019-09-21 MED ORDER — TIOTROPIUM BROMIDE MONOHYDRATE 2.5 MCG/ACT IN AERS: 1.0000 | INHALATION_SPRAY | Freq: Every day | RESPIRATORY_TRACT | Status: DC

## 2019-09-21 MED ORDER — DOCUSATE SODIUM 100 MG PO CAPS: 100.0000 mg | ORAL_CAPSULE | Freq: Every day | ORAL | Status: DC | PRN

## 2019-09-21 MED ORDER — LEVOTHYROXINE SODIUM 50 MCG PO TABS: 25.0000 ug | ORAL_TABLET | Freq: Every day | ORAL | Status: DC

## 2019-09-21 MED ORDER — SODIUM CHLORIDE 0.9 % IV SOLN: 2.0000 g | Freq: Once | INTRAVENOUS | Status: DC

## 2019-09-21 MED ORDER — NAPROXEN SODIUM 220 MG PO TABS: 220.0000 mg | ORAL_TABLET | Freq: Two times a day (BID) | ORAL | Status: DC | PRN

## 2019-09-21 MED ORDER — FUROSEMIDE 40 MG PO TABS: 20.0000 mg | ORAL_TABLET | Freq: Every day | ORAL | Status: DC

## 2019-09-21 MED ORDER — PROPRANOLOL HCL 20 MG PO TABS: 10.0000 mg | ORAL_TABLET | Freq: Two times a day (BID) | ORAL | Status: DC | PRN

## 2019-09-21 MED ORDER — SALINE SPRAY 0.65 % NA SOLN
1.0000 | NASAL | Status: DC | PRN
  Filled 2019-09-21: qty 44

## 2019-09-21 MED ORDER — TIZANIDINE HCL 4 MG PO TABS: 4.0000 mg | ORAL_TABLET | Freq: Four times a day (QID) | ORAL | Status: DC | PRN

## 2019-09-21 MED ORDER — IBUPROFEN 600 MG PO TABS
600.0000 mg | ORAL_TABLET | ORAL | Status: DC
  Filled 2019-09-21: qty 1

## 2019-09-21 MED ORDER — PREGABALIN 25 MG PO CAPS: 25.0000 mg | ORAL_CAPSULE | Freq: Three times a day (TID) | ORAL | Status: DC

## 2019-09-21 MED ORDER — FERROUS SULFATE 325 (65 FE) MG PO TABS: 325.0000 mg | ORAL_TABLET | Freq: Every day | ORAL | Status: DC

## 2019-09-21 MED ORDER — FLUTICASONE PROPIONATE 50 MCG/ACT NA SUSP: 1.0000 | Freq: Every day | NASAL | Status: DC

## 2019-09-21 MED ORDER — SODIUM CHLORIDE 0.9% FLUSH: 3.0000 mL | Freq: Once | INTRAVENOUS | Status: DC

## 2019-09-21 MED ORDER — DIVALPROEX SODIUM ER 250 MG PO TB24: 2000.0000 mg | ORAL_TABLET | Freq: Every day | ORAL | Status: DC

## 2019-09-21 MED ORDER — METOPROLOL SUCCINATE ER 50 MG PO TB24: 25.0000 mg | ORAL_TABLET | Freq: Every day | ORAL | Status: DC

## 2019-09-21 MED ORDER — HYDROCORTISONE NA SUCCINATE PF 100 MG IJ SOLR: 50.0000 mg | Freq: Four times a day (QID) | INTRAMUSCULAR | Status: DC

## 2019-09-21 MED ORDER — METHYLPREDNISOLONE SODIUM SUCC 40 MG IJ SOLR: 40.0000 mg | Freq: Four times a day (QID) | INTRAMUSCULAR | Status: DC

## 2019-09-21 MED ORDER — METHOTREXATE 2.5 MG PO TABS: 20.0000 mg | ORAL_TABLET | ORAL | Status: DC

## 2019-09-21 MED ORDER — FOLIC ACID 1 MG PO TABS: 1.0000 mg | ORAL_TABLET | Freq: Every day | ORAL | Status: DC

## 2019-09-21 MED ORDER — SODIUM CHLORIDE 0.9 % IV SOLN: INTRAVENOUS | Status: DC

## 2019-09-21 MED ORDER — SODIUM CHLORIDE 0.9 % IV SOLN
2.0000 g | Freq: Once | INTRAVENOUS | Status: DC
  Filled 2019-09-21: qty 2

## 2019-09-21 MED ORDER — LORATADINE 10 MG PO TABS: 10.0000 mg | ORAL_TABLET | Freq: Every day | ORAL | Status: DC

## 2019-09-21 MED ORDER — CLONAZEPAM 0.5 MG PO TABS: 0.5000 mg | ORAL_TABLET | Freq: Every evening | ORAL | Status: DC | PRN

## 2019-09-21 MED ORDER — ONDANSETRON HCL 4 MG PO TABS: 4.0000 mg | ORAL_TABLET | Freq: Four times a day (QID) | ORAL | Status: DC | PRN

## 2019-09-21 MED ORDER — IOHEXOL 350 MG/ML SOLN
75.0000 mL | Freq: Once | INTRAVENOUS | Status: AC | PRN
  Administered 2019-09-21: 75 mL via INTRAVENOUS

## 2019-09-21 MED ORDER — VANCOMYCIN HCL IN DEXTROSE 1-5 GM/200ML-% IV SOLN: 1000.0000 mg | Freq: Once | INTRAVENOUS | Status: DC

## 2019-09-21 MED ORDER — GUAIFENESIN-DM 100-10 MG/5ML PO SYRP: 5.0000 mL | ORAL_SOLUTION | ORAL | Status: DC | PRN

## 2019-09-21 MED ORDER — MOMETASONE FURO-FORMOTEROL FUM 200-5 MCG/ACT IN AERO: 2.0000 | INHALATION_SPRAY | Freq: Two times a day (BID) | RESPIRATORY_TRACT | Status: DC

## 2019-09-21 MED ORDER — HYDROXYZINE HCL 25 MG PO TABS: 50.0000 mg | ORAL_TABLET | Freq: Four times a day (QID) | ORAL | Status: DC | PRN

## 2019-09-21 MED ORDER — HYDROCOD POLST-CPM POLST ER 10-8 MG/5ML PO SUER: 5.0000 mL | Freq: Two times a day (BID) | ORAL | Status: DC

## 2019-09-21 MED ORDER — BENZONATATE 100 MG PO CAPS
100.0000 mg | ORAL_CAPSULE | Freq: Once | ORAL | Status: AC
  Administered 2019-09-21: 100 mg via ORAL
  Filled 2019-09-21: qty 1

## 2019-09-21 MED ORDER — LACTATED RINGERS IV BOLUS
1000.0000 mL | Freq: Once | INTRAVENOUS | Status: AC
  Administered 2019-09-21: 1000 mL via INTRAVENOUS

## 2019-09-21 MED ORDER — ONDANSETRON HCL 4 MG/2ML IJ SOLN: 4.0000 mg | Freq: Four times a day (QID) | INTRAMUSCULAR | Status: DC | PRN

## 2019-09-21 MED ORDER — SODIUM CHLORIDE 0.9 % IV BOLUS
1000.0000 mL | Freq: Once | INTRAVENOUS | Status: AC
  Administered 2019-09-21: 1000 mL via INTRAVENOUS

## 2019-09-21 MED ORDER — DEXAMETHASONE SODIUM PHOSPHATE 10 MG/ML IJ SOLN
6.0000 mg | Freq: Once | INTRAMUSCULAR | Status: AC
  Administered 2019-09-21: 6 mg via INTRAVENOUS
  Filled 2019-09-21: qty 1

## 2019-09-21 MED ORDER — ENOXAPARIN SODIUM 40 MG/0.4ML ~~LOC~~ SOLN: 40.0000 mg | SUBCUTANEOUS | Status: DC

## 2019-09-21 NOTE — Consult Note (Signed)
PHARMACY -  BRIEF ANTIBIOTIC NOTE   Pharmacy has received consult(s) for vancomycin + cefepime from an ED provider.  The patient's profile has been reviewed for ht/wt/allergies/indication/available labs.   One time order(s) placed for  -Vancomycin 1 g  -Cefepime 2 g  Further antibiotics/pharmacy consults should be ordered by admitting physician if indicated.                       Thank you, Tressie Ellis  Pharmacy Resident 09/21/2019  4:28 PM

## 2019-09-21 NOTE — ED Notes (Signed)
Patient given ED sandwich tray, water and a telephone.

## 2019-09-21 NOTE — H&P (Signed)
History and Physical    Jennifer Vasquez LZJ:[id number] DOB: 1985-12-10 DOA: 09/21/2019  PCP: [facility]  Patient coming from: Home  I have personally briefly reviewed patient's old medical records in [facility]  Chief Complaint: Cough and shortness of breath  HPI: Jennifer Vasquez is a 34 y.o. female with medical history significant of rheumatoid arthritis on methotrexate, bipolar disorder, polysubstance abuse, anxiety, persistent asthma who presents for evaluation of 4 to 5 days of worsening cough and shortness of breath.  Apparently there was also some potential syncopal or near syncopal event although the patient was unable to elaborate on this with me.  History has been somewhat unreliable from this patient because apparently she told the ED provider that somebody came to her house and told her she was positive for Covid.  She was unable to elaborate on who however she thought it was the [facility] department.  With the ED providers help we called the [facility] department and they have no record of that.  She had a Covid test here including a PCR as well as a rapid test.  Both of these are negative.  The patient presented with hypoxia down into the 80s on room air.  She was placed on nasal cannula 3 to 4 L with good improvement in her saturations.  Given the lack of clarity of her history and initial presentation a extensive CT study was done by the ED provider.  Results were significant for extensive bilateral multifocal infiltrates in the lungs.  This was concerning for an atypical process.  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  She strongly denies any inhalational cocaine use or IV drug use.  She states that the opioids positive on her urine  drug screen were secondary to opioid-containing cough suppression she was taking.   ED Course: On presentation the patient was found to be hypoxic into the 80s.  She was placed on 3 L nasal cannula with good result.  Saturations improved.  Extensive CT imaging survey as above.  Extensive bilateral infiltrates noted on chest CT.  Discussed with ED provider at length.  Decision made to start patient empirically on broad-spectrum antibiotics.  Patient does have a minimally elevated procalcitonin however presentation is not entirely consistent with a bacterial infection.  After initiation of broad-spectrum antibiotics hospitalist called for admission.  Review of Systems: As per HPI otherwise 10 point review of systems negative.    Past Medical History:  Diagnosis Date  . Acute carpal tunnel syndrome 10/10/2014  . Anxiety state 09/03/2009  . Asthma   . Bipolar disorder (HCC) 02/09/2013  . CHF (congestive heart failure) (HCC)   . COPD (chronic obstructive pulmonary disease) (HCC)   . DDD (degenerative disc disease), cervical 11/13/2014  . DDD (degenerative disc disease), lumbar 11/13/2014  . Hepatitis C   . Lumbosacral radiculopathy 11/13/2014  . Tobacco use disorder 09/06/2012    Past Surgical History:  Procedure Laterality Date  . CERVICAL DISC ARTHROPLASTY    . TONSILLECTOMY    . TUBAL LIGATION       reports that she has been smoking cigarettes. She has been smoking about 0.50 packs per day. She has never used smokeless tobacco. She reports previous alcohol use. She reports that she does not use  drugs.  Allergies  Allergen Reactions  . Trazodone And Nefazodone     Pt says she was told it could stop her heart  . Acetaminophen     Other reaction(s): Other (qualifier value) Due to hep c  . Amoxicillin-Pot Clavulanate Diarrhea and Nausea And Vomiting  . Gabapentin Hives  . Tetanus Toxoids   . Tramadol Hives  . Shellfish Allergy Rash    oysters    Family History  Problem Relation Age  of Onset  . Arthritis Mother   . Asthma Mother   . Diabetes Mother   . Hyperlipidemia Mother   . Vision loss Mother   . Alcohol abuse Father   . Asthma Father   . Diabetes Father   . Heart disease Father   . Depression Father   . COPD Father   . Cancer Father   . Hypertension Father   . Kidney disease Father   . Vision loss Father      Prior to Admission medications   Medication Sig Start Date End Date Taking? Authorizing Provider  acetaminophen (TYLENOL) 500 MG tablet Take 500-1,000 mg by mouth every 6 (six) hours as needed for mild pain or moderate pain.   Yes [provider]  albuterol (PROVENTIL HFA;VENTOLIN HFA) 108 (90 Base) MCG/ACT inhaler Inhale 2 puffs into the lungs every 6 (six) hours as needed for wheezing or shortness of breath. 01/17/18  Yes Darel Hong, MD  albuterol (PROVENTIL) (2.5 MG/3ML) 0.083% nebulizer solution Take 2.5 mg by nebulization every 4 (four) hours as needed for wheezing or shortness of breath.   Yes [provider]  budesonide-formoterol (SYMBICORT) 160-4.5 MCG/ACT inhaler Inhale 2 puffs into the lungs 2 (two) times daily. 07/13/19 07/12/20 Yes [provider]  celecoxib (CELEBREX) 100 MG capsule Take 100 mg by mouth 2 (two) times daily. 09/19/19 09/18/20 Yes [provider]  clonazePAM (KLONOPIN) 0.5 MG tablet Take 0.5 mg by mouth at bedtime as needed (sleep).    Yes [provider]  dextromethorphan-guaiFENesin (MUCINEX DM) 30-600 MG 12hr tablet Take 1 tablet by mouth 2 (two) times daily as needed for cough (congestion).    Yes [provider]  divalproex (DEPAKOTE ER) 500 MG 24 hr tablet Take 2,000 mg by mouth daily. 10/11/19 11/10/19 Yes [provider]  docusate sodium (COLACE) 100 MG capsule Take 100 mg by mouth daily as needed for mild constipation.   Yes [provider]  famotidine-calcium carbonate-magnesium hydroxide (PEPCID COMPLETE) 10-800-165 MG chewable tablet Chew 1 tablet by  mouth 2 (two) times daily as needed (heartburn or stomach acid).   Yes [provider]  ferrous sulfate 325 (65 FE) MG tablet Take 325 mg by mouth daily with breakfast.   Yes [provider]  fluticasone (FLONASE) 50 MCG/ACT nasal spray Place 1 spray into both nostrils daily. 07/11/19 07/10/20 Yes [provider]  folic acid (FOLVITE) 1 MG tablet Take 1 mg by mouth daily. 06/15/19 06/14/20 Yes [provider]  furosemide (LASIX) 20 MG tablet Take 20 mg by mouth daily.    Yes [provider]  hydrOXYzine (ATARAX/VISTARIL) 25 MG tablet Take 50 mg by mouth every 6 (six) hours as needed for anxiety. 07/18/19  Yes [provider]  levothyroxine (SYNTHROID, LEVOTHROID) 25 MCG tablet Take 25 mcg by mouth daily before breakfast.   Yes [provider]  lidocaine (LIDODERM) 5 % Place 1 patch onto the skin daily. Remove & Discard patch within 12 hours or as directed by MD 04/27/19  Yes Enid Derry, PA-C  loratadine (CLARITIN) 10 MG tablet Take 10 mg by mouth daily.   Yes [provider]  methotrexate (RHEUMATREX) 2.5 MG tablet Take 20 mg by mouth every Sunday.  07/11/19  Yes [provider]  metoprolol succinate (TOPROL-XL) 25 MG 24 hr tablet Take 25 mg by mouth daily.   Yes [provider]  naproxen sodium (ALEVE) 220 MG tablet Take 220-440 mg by mouth 2 (two) times daily as needed (pain).   Yes [provider]  pregabalin (LYRICA) 25 MG capsule Take 25 mg by mouth 3 (three) times daily. 09/03/19  Yes [provider]  propranolol (INDERAL) 10 MG tablet Take 10 mg by mouth 2 (two) times daily as needed for anxiety. 09/10/19 12/09/19 Yes [provider]  sodium chloride (OCEAN) 0.65 % SOLN nasal spray Place 1 spray into both nostrils as needed for congestion.   Yes [provider]  Tiotropium Bromide Monohydrate 2.5 MCG/ACT AERS Inhale 1 puff into the lungs daily.   Yes [provider]   tiZANidine (ZANAFLEX) 4 MG tablet Take 4 mg by mouth every 6 (six) hours as needed for muscle spasms.    Yes [provider]  traMADol (ULTRAM) 50 MG tablet Take 50 mg by mouth at bedtime as needed for severe pain.    Yes [provider]  traZODone (DESYREL) 100 MG tablet Take 100 mg by mouth at bedtime.   Yes [provider]    Physical Exam: Vitals:   09/21/19 1503 09/21/19 1530 09/21/19 1600 09/21/19 1627  BP: 104/62 (!) 101/48 97/62   Pulse: 77 80 78   Resp: Temp:      TempSrc:      SpO2: 96% 91% 100% 93%  Weight:      Height:         Vitals:   09/21/19 1503 09/21/19 1530 09/21/19 1600 09/21/19 1627  BP: 104/62 (!) 101/48 97/62   Pulse: 77 80 78   Resp: Temp:      TempSrc:      SpO2: 96% 91% 100% 93%  Weight:      Height:      Constitutional: Mild respiratory distress, appears anxious Eyes: PERRL, lids and conjunctivae normal ENMT: Mucous membranes are moist. Posterior pharynx clear of any exudate or lesions.poor dentition Neck: normal, supple, no masses, no thyromegaly Respiratory: Coarse crackles in all lung fields bilaterally.  Diminished air movement.  Normal work of breathing Cardiovascular: Regular rate and rhythm, no murmurs / rubs / gallops. No extremity edema. 2+ pedal pulses. No carotid bruits.  Abdomen: Obese, nontender, nondistended, positive bowel sounds musculoskeletal: no clubbing / cyanosis. No joint deformity upper and lower extremities. Good ROM, no contractures. Normal muscle tone.  Skin: no rashes, lesions, ulcers. No induration Neurologic: CN 2-12 grossly intact. Sensation intact Psychiatric: Normal judgment and insight. Alert and oriented x 3. Normal mood.     Labs on Admission: I have personally reviewed following labs and imaging studies  CBC: Recent Labs  Lab 09/21/19 1225  WBC 19.2*  HGB 11.3*  HCT 35.1*  MCV 101.4*  PLT 230   Basic Metabolic Panel: Recent Labs  Lab 09/21/19 1142    NA 138  K 4.4  CL 103  CO2 29  GLUCOSE 138*  BUN 18  CREATININE 0.78  CALCIUM 8.4*   GFR: Estimated Creatinine Clearance: 109.7 mL/min (by C-G formula based on SCr of 0.78 mg/dL). Liver Function  Tests: Recent Labs  Lab 09/21/19 1225  AST 24  ALT 12  ALKPHOS 52  BILITOT 0.5  PROT 6.1*  ALBUMIN 3.4*   No results for input(s): LIPASE, AMYLASE in the last 168 hours. No results for input(s): AMMONIA in the last 168 hours. Coagulation Profile: No results for input(s): INR, PROTIME in the last 168 hours. Cardiac Enzymes: No results for input(s): CKTOTAL, CKMB, CKMBINDEX, TROPONINI in the last 168 hours. BNP (last 3 results) No results for input(s): PROBNP in the last 8760 hours. HbA1C: No results for input(s): HGBA1C in the last 72 hours. CBG: No results for input(s): GLUCAP in the last 168 hours. Lipid Profile: Recent Labs    09/21/19 1225  TRIG 136   Thyroid Function Tests: No results for input(s): TSH, T4TOTAL, FREET4, T3FREE, THYROIDAB in the last 72 hours. Anemia Panel: Recent Labs    09/21/19 1225  FERRITIN 133   Urine analysis:    Component Value Date/Time   COLORURINE YELLOW (A) 07/05/2018 0641   APPEARANCEUR CLEAR (A) 07/05/2018 0641   LABSPEC 1.005 07/05/2018 0641   PHURINE 7.0 07/05/2018 0641   GLUCOSEU NEGATIVE 07/05/2018 0641   HGBUR LARGE (A) 07/05/2018 0641   BILIRUBINUR NEGATIVE 07/05/2018 0641   KETONESUR NEGATIVE 07/05/2018 0641   PROTEINUR 30 (A) 07/05/2018 0641   NITRITE NEGATIVE 07/05/2018 0641   LEUKOCYTESUR MODERATE (A) 07/05/2018 0641    Radiological Exams on Admission: CT Head Wo Contrast  Result Date: 09/21/2019 CLINICAL DATA:  History of COVID-19 positivity with recent loss of consciousness and neck pain EXAM: CT HEAD WITHOUT CONTRAST CT CERVICAL SPINE WITHOUT CONTRAST TECHNIQUE: Multidetector CT imaging of the head and cervical spine was performed following the standard protocol without intravenous contrast. Multiplanar CT  image reconstructions of the cervical spine were also generated. COMPARISON:  None. FINDINGS: CT HEAD FINDINGS Brain: No evidence of acute infarction, hemorrhage, hydrocephalus, extra-axial collection or mass lesion/mass effect. Vascular: No hyperdense vessel or unexpected calcification. Skull: Normal. Negative for fracture or focal lesion. Sinuses/Orbits: No acute finding. Other: None. CT CERVICAL SPINE FINDINGS Alignment: Loss of normal cervical lordosis is noted which may be related to muscular spasm. Skull base and vertebrae: Seven cervical segments are well visualized. Vertebral body height is well maintained. Changes of prior fusion at C6-7 are seen. Mild osteophytic changes and disc space narrowing is noted as well. Mild facet hypertrophic changes are noted. The odontoid is within normal limits. No acute fracture or acute facet abnormality is noted. Soft tissues and spinal canal: Surrounding soft tissue structures show no acute abnormality. Upper chest: Visualized lung apices demonstrate diffuse ground-glass opacities consistent with the given clinical history of COVID-19 positivity. Other: None IMPRESSION: CT of the head: No acute intracranial abnormality noted. CT of the cervical spine: Postsurgical and degenerative changes without acute abnormality. Diffuse upper lobe infiltrates in the lungs bilaterally consistent with the given clinical history of COVID-19 positivity. Electronically Signed   By: Alcide Clever M.D.   On: 09/21/2019 15:20   CT Angio Chest PE W and/or Wo Contrast  Result Date: 09/21/2019 CLINICAL DATA:  Chest pain altered mental status EXAM: CT ANGIOGRAPHY CHEST CT ABDOMEN AND PELVIS WITH CONTRAST TECHNIQUE: Multidetector CT imaging of the chest was performed using the standard protocol during bolus administration of intravenous contrast. Multiplanar CT image reconstructions and MIPs were obtained to evaluate the vascular anatomy. Multidetector CT imaging of the abdomen and pelvis was  performed using the standard protocol during bolus administration of intravenous contrast. CONTRAST:  10mL OMNIPAQUE IOHEXOL  350 MG/ML SOLN COMPARISON:  CT chest 06/25/2018 FINDINGS: CTA CHEST FINDINGS Cardiovascular: Satisfactory opacification of the pulmonary arteries to the segmental level. No evidence of pulmonary embolism. Normal heart size. No pericardial effusion. Nonaneurysmal aorta. No dissection is seen. Mediastinum/Nodes: Midline trachea. No thyroid mass. Subcentimeter mediastinal and hilar nodes. Esophagus within normal limits Lungs/Pleura: Extensive patchy consolidations and ground-glass densities throughout both lungs. No pleural effusion or pneumothorax. Musculoskeletal: No chest wall abnormality. No acute or significant osseous findings. Review of the MIP images confirms the above findings. CT ABDOMEN and PELVIS FINDINGS Hepatobiliary: Focal fat infiltration near the falciform ligament. No calcified gallstone or biliary dilatation Pancreas: Unremarkable. No pancreatic ductal dilatation or surrounding inflammatory changes. Spleen: Normal in size without focal abnormality. Adrenals/Urinary Tract: Adrenal glands are unremarkable. Kidneys are normal, without renal calculi, focal lesion, or hydronephrosis. Bladder is unremarkable. Stomach/Bowel: Stomach is within normal limits. Appendix appears normal. No evidence of bowel wall thickening, distention, or inflammatory changes. Vascular/Lymphatic: No significant vascular findings are present. No enlarged abdominal or pelvic lymph nodes. Reproductive: Uterus and bilateral adnexa are unremarkable. 2 cm cyst or dominant follicle in the left ovary Other: No abdominal wall hernia or abnormality. No abdominopelvic ascites. Musculoskeletal: No acute or significant osseous findings. Review of the MIP images confirms the above findings. IMPRESSION: 1. Negative for acute pulmonary embolus or aortic dissection. 2. Extensive bilateral patchy consolidations and  ground-glass density, probably representing widespread pneumonia given history of COVID positivity. 3. No CT evidence for acute intra-abdominal or pelvic abnormality. Electronically Signed   By: Jasmine Pang M.D.   On: 09/21/2019 15:28   CT Cervical Spine Wo Contrast  Result Date: 09/21/2019 CLINICAL DATA:  History of COVID-19 positivity with recent loss of consciousness and neck pain EXAM: CT HEAD WITHOUT CONTRAST CT CERVICAL SPINE WITHOUT CONTRAST TECHNIQUE: Multidetector CT imaging of the head and cervical spine was performed following the standard protocol without intravenous contrast. Multiplanar CT image reconstructions of the cervical spine were also generated. COMPARISON:  None. FINDINGS: CT HEAD FINDINGS Brain: No evidence of acute infarction, hemorrhage, hydrocephalus, extra-axial collection or mass lesion/mass effect. Vascular: No hyperdense vessel or unexpected calcification. Skull: Normal. Negative for fracture or focal lesion. Sinuses/Orbits: No acute finding. Other: None. CT CERVICAL SPINE FINDINGS Alignment: Loss of normal cervical lordosis is noted which may be related to muscular spasm. Skull base and vertebrae: Seven cervical segments are well visualized. Vertebral body height is well maintained. Changes of prior fusion at C6-7 are seen. Mild osteophytic changes and disc space narrowing is noted as well. Mild facet hypertrophic changes are noted. The odontoid is within normal limits. No acute fracture or acute facet abnormality is noted. Soft tissues and spinal canal: Surrounding soft tissue structures show no acute abnormality. Upper chest: Visualized lung apices demonstrate diffuse ground-glass opacities consistent with the given clinical history of COVID-19 positivity. Other: None IMPRESSION: CT of the head: No acute intracranial abnormality noted. CT of the cervical spine: Postsurgical and degenerative changes without acute abnormality. Diffuse upper lobe infiltrates in the lungs  bilaterally consistent with the given clinical history of COVID-19 positivity. Electronically Signed   By: Alcide Clever M.D.   On: 09/21/2019 15:20   CT ABDOMEN PELVIS W CONTRAST  Result Date: 09/21/2019 CLINICAL DATA:  Chest pain altered mental status EXAM: CT ANGIOGRAPHY CHEST CT ABDOMEN AND PELVIS WITH CONTRAST TECHNIQUE: Multidetector CT imaging of the chest was performed using the standard protocol during bolus administration of intravenous contrast. Multiplanar CT image reconstructions and MIPs were obtained to  evaluate the vascular anatomy. Multidetector CT imaging of the abdomen and pelvis was performed using the standard protocol during bolus administration of intravenous contrast. CONTRAST:  75mL OMNIPAQUE IOHEXOL 350 MG/ML SOLN COMPARISON:  CT chest 06/25/2018 FINDINGS: CTA CHEST FINDINGS Cardiovascular: Satisfactory opacification of the pulmonary arteries to the segmental level. No evidence of pulmonary embolism. Normal heart size. No pericardial effusion. Nonaneurysmal aorta. No dissection is seen. Mediastinum/Nodes: Midline trachea. No thyroid mass. Subcentimeter mediastinal and hilar nodes. Esophagus within normal limits Lungs/Pleura: Extensive patchy consolidations and ground-glass densities throughout both lungs. No pleural effusion or pneumothorax. Musculoskeletal: No chest wall abnormality. No acute or significant osseous findings. Review of the MIP images confirms the above findings. CT ABDOMEN and PELVIS FINDINGS Hepatobiliary: Focal fat infiltration near the falciform ligament. No calcified gallstone or biliary dilatation Pancreas: Unremarkable. No pancreatic ductal dilatation or surrounding inflammatory changes. Spleen: Normal in size without focal abnormality. Adrenals/Urinary Tract: Adrenal glands are unremarkable. Kidneys are normal, without renal calculi, focal lesion, or hydronephrosis. Bladder is unremarkable. Stomach/Bowel: Stomach is within normal limits. Appendix appears normal.  No evidence of bowel wall thickening, distention, or inflammatory changes. Vascular/Lymphatic: No significant vascular findings are present. No enlarged abdominal or pelvic lymph nodes. Reproductive: Uterus and bilateral adnexa are unremarkable. 2 cm cyst or dominant follicle in the left ovary Other: No abdominal wall hernia or abnormality. No abdominopelvic ascites. Musculoskeletal: No acute or significant osseous findings. Review of the MIP images confirms the above findings. IMPRESSION: 1. Negative for acute pulmonary embolus or aortic dissection. 2. Extensive bilateral patchy consolidations and ground-glass density, probably representing widespread pneumonia given history of COVID positivity. 3. No CT evidence for acute intra-abdominal or pelvic abnormality. Electronically Signed   By: Jasmine Pang M.D.   On: 09/21/2019 15:28   DG Chest Port 1 View  Result Date: 09/21/2019 CLINICAL DATA:  Chest pain.  COVID positive. EXAM: PORTABLE CHEST 1 VIEW COMPARISON:  Chest x-ray dated March 11, 2019. FINDINGS: Stable cardiomediastinal silhouette. Diffuse patchy opacities throughout both lungs, greater on the left. No pleural effusion or pneumothorax. No acute osseous abnormality. IMPRESSION: Diffuse bilateral pulmonary opacities favor atypical infection such as viral pneumonia given clinical history. Electronically Signed   By: Obie Dredge M.D.   On: 09/21/2019 12:22    EKG: Independently reviewed.  Normal sinus rhythm  Assessment/Plan Active Problems:   Multifocal pneumonia  Multifocal pneumonia, etiology unclear Acute hypoxic respiratory failure secondary to above Patient with rather unclear history Risk factors for development of atypical extensive pneumonia include polysubstance abuse, rheumatoid arthritis Patient is afebrile but does have leukocytosis Covid not high on differential considering negative PCR Possibly BOOP/COP considering underlying presence of rheumatoid Patient states that  she was previously on longstanding steroid therapy Plan: Admit to telemetry Blood culture x2 Sputum culture Legionella antigen Strep pneumo antigen Pneumocystis PCR Start vancomycin IV, pharmacy to dose Cefepime IV, pharmacy to dose Empiric steroids, Solu-Medrol 40 mg IV every 6 hours Bronchodilator therapy Supplemental oxygen, wean as tolerated Pulmonary consult called, recommendations appreciated.  Message sent to Dr. Lucianne Lei  Rheumatoid arthritis Patient on methotrexate, will continue  Persistent asthma Nebulizer regimen as above for multifocal pneumonia Oxygen therapy as above  Bipolar disorder Anxiety Continue home medications  Hypothyroidism Continue home Synthroid  Hypertension Continue home metoprolol  Seizure disorder Continue home Depakote   DVT prophylaxis: Lovenox Code Status: Full Family Communication: Father Link Snuffer via phone [phone]  disposition Plan: Anticipate return to home environment Consults called: Pulmonology- Carolynne Edouard Admission status: telemetry  [employer]  Inis Sizer MD [facility] Pager [phone]  If 7PM-7AM, please contact night-coverage 09/21/2019, 4:54 PM

## 2019-09-21 NOTE — ED Notes (Signed)
Patient rang call bell. When this RN came into ;the room, the patient was out of bed and in the bathroom without her gown or O2, washing in the sink. Patient is agitated, states she wants to leave AMA and that she feels better after eating, but is upset that someone wasn't in there faster and that she was in a wet brief "all day."  Patient had not mentioned the need to toilet to this RN.

## 2019-09-21 NOTE — ED Provider Notes (Addendum)
[facility] Emergency Department Provider Note  ____________________________________________   First MD Initiated Contact with Patient 09/21/19 1136     (approximate)  I have reviewed the triage vital signs and the nursing notes.   HISTORY  Chief Complaint Chest Pain    HPI Jennifer Vasquez is a 34 y.o. female with COPD, CHF, bipolar who comes in with concerns for Covid.  Patient reports that she had someone come by ER with EMS today and she was positive for Covid although she does not have proof of these results.  She states that she started feeling unwell 1 week ago.  She started having some chest pain, constant, worse with taking a deep breath, better at rest.  She states that over the past day she has had episodes of losing consciousness that will last for a few minutes and then go back to her baseline.  She also reports some lower abdominal pain that is been going on since yesterday.  Patient had normal respiratory rate but was satting in the 80s on room air.          Past Medical History:  Diagnosis Date  . Acute carpal tunnel syndrome 10/10/2014  . Anxiety state 09/03/2009  . Asthma   . Bipolar disorder (HCC) 02/09/2013  . CHF (congestive heart failure) (HCC)   . COPD (chronic obstructive pulmonary disease) (HCC)   . DDD (degenerative disc disease), cervical 11/13/2014  . DDD (degenerative disc disease), lumbar 11/13/2014  . Hepatitis C   . Lumbosacral radiculopathy 11/13/2014  . Tobacco use disorder 09/06/2012    Patient Active Problem List   Diagnosis Date Noted  . Sepsis due to pneumonia (HCC) 07/05/2018  . Community acquired pneumonia of left lung   . Respiratory failure (HCC) 11/05/2016  . DDD (degenerative disc disease), lumbar 11/13/2014  . DDD (degenerative disc disease), cervical 11/13/2014  . Lumbosacral radiculopathy 11/13/2014  . Acute carpal tunnel syndrome 10/10/2014  . Chronic pain 03/02/2013  . Depression 03/02/2013  . Bipolar  disorder (HCC) 02/09/2013  . Hepatitis C 12/18/2012  . Asthma 11/23/2012  . Neck pain 11/23/2012  . Torticollis 11/23/2012  . Tobacco use disorder 09/06/2012  . Anxiety state 09/03/2009    Past Surgical History:  Procedure Laterality Date  . CERVICAL DISC ARTHROPLASTY    . TONSILLECTOMY    . TUBAL LIGATION      Prior to Admission medications   Medication Sig Start Date End Date Taking? Authorizing Provider  acetaminophen (TYLENOL) 650 MG CR tablet Take 650-1,300 mg by mouth every 8 (eight) hours as needed for pain.    Yes [provider]  albuterol (PROVENTIL HFA;VENTOLIN HFA) 108 (90 Base) MCG/ACT inhaler Inhale 2 puffs into the lungs every 6 (six) hours as needed for wheezing or shortness of breath. 01/17/18  Yes Merrily Brittle, MD  albuterol (PROVENTIL) (2.5 MG/3ML) 0.083% nebulizer solution Take 2.5 mg by nebulization every 4 (four) hours as needed for wheezing or shortness of breath.   Yes [provider]  budesonide-formoterol (SYMBICORT) 160-4.5 MCG/ACT inhaler Inhale 2 puffs into the lungs 2 (two) times daily. 07/13/19 07/12/20 Yes [provider]  celecoxib (CELEBREX) 100 MG capsule Take 100 mg by mouth 2 (two) times daily. 09/19/19 09/18/20 Yes [provider]  clonazePAM (KLONOPIN) 0.5 MG tablet Take 0.5 mg by mouth at bedtime as needed (sleep).    Yes [provider]  divalproex (DEPAKOTE ER) 500 MG 24 hr tablet Take 2,000 mg by mouth daily. 10/11/19 11/10/19 Yes  [provider]  fluticasone (FLONASE) 50 MCG/ACT nasal spray Place 1 spray into both nostrils daily. 07/11/19 07/10/20 Yes [provider]  folic acid (FOLVITE) 1 MG tablet Take 1 mg by mouth daily. 06/15/19 06/14/20 Yes [provider]  furosemide (LASIX) 20 MG tablet Take 20 mg by mouth daily.    Yes [provider]  hydrOXYzine (ATARAX/VISTARIL) 25 MG tablet Take 50 mg by mouth every 6 (six) hours as needed for anxiety. 07/18/19  Yes [provider]  levothyroxine (SYNTHROID, LEVOTHROID) 25 MCG tablet Take 25 mcg by mouth daily before breakfast.   Yes [provider]  lidocaine (LIDODERM) 5 % Place 1 patch onto the skin daily. Remove & Discard patch within 12 hours or as directed by MD 04/27/19  Yes Enid Derry, PA-C  loratadine (CLARITIN) 10 MG tablet Take 10 mg by mouth daily.   Yes [provider]  methotrexate (RHEUMATREX) 2.5 MG tablet Take 20 mg by mouth every Sunday.  07/11/19  Yes [provider]  pregabalin (LYRICA) 25 MG capsule Take 25 mg by mouth 3 (three) times daily. 09/03/19  Yes [provider]  propranolol (INDERAL) 10 MG tablet Take 10 mg by mouth 2 (two) times daily as needed for anxiety. 09/10/19 12/09/19 Yes [provider]  Tiotropium Bromide Monohydrate 2.5 MCG/ACT AERS Inhale 1 puff into the lungs daily.   Yes [provider]  tiZANidine (ZANAFLEX) 4 MG tablet Take 4 mg by mouth every 6 (six) hours as needed for muscle spasms.    Yes [provider]  traMADol (ULTRAM) 50 MG tablet Take 50 mg by mouth at bedtime as needed for severe pain.    Yes [provider]  traZODone (DESYREL) 100 MG tablet Take 100 mg by mouth at bedtime.   Yes [provider]    Allergies Trazodone and nefazodone, Acetaminophen, Amoxicillin-pot clavulanate, Gabapentin, Tetanus toxoids, Tramadol, and Shellfish allergy  Family History  Problem Relation Age of Onset  . Arthritis Mother   . Asthma Mother   . Diabetes Mother   . Hyperlipidemia Mother   . Vision loss Mother   . Alcohol abuse Father   . Asthma Father   . Diabetes Father   . Heart disease Father   . Depression Father   . COPD Father   . Cancer Father   . Hypertension Father   . Kidney disease Father   . Vision loss Father     Social History Social History   Tobacco Use  . Smoking status: Current Some Day Smoker    Packs/day: 0.50    Types: Cigarettes  . Smokeless tobacco:  Never Used  Substance Use Topics  . Alcohol use: Not Currently    Alcohol/week: 0.0 standard drinks  . Drug use: No      Review of Systems Constitutional: No fever/chills Eyes: No visual changes. ENT: No sore throat. Cardiovascular: Positive chest pain Respiratory: Positive for SOB Gastrointestinal: Positive abdominal pain no nausea, no vomiting.  No diarrhea.  No constipation. Genitourinary: Negative for dysuria. Musculoskeletal: Negative for back pain. Skin: Negative for rash. Neurological: Negative for headaches, focal weakness or numbness.  Positive syncope All other ROS negative ____________________________________________   PHYSICAL EXAM:  VITAL SIGNS: ED Triage Vitals  Enc Vitals Group     BP 09/21/19 1141 106/62     Pulse Rate 09/21/19 1141 93     Resp 09/21/19 1141 11     Temp 09/21/19 1141 98.4 F (36.9 C)  Temp Source 09/21/19 1141 Oral     SpO2 09/21/19 1141 (!) 78 %     Weight 09/21/19 1138 194 lb (88 kg)     Height 09/21/19 1138 5\' 6"  (1.676 m)     Head Circumference --      Peak Flow --      Pain Score 09/21/19 1138 6     Pain Loc --      Pain Edu? --      Excl. in [place]? --     Constitutional: Alert and oriented.  Slightly diaphoretic Eyes: Conjunctivae are normal. EOMI. Head: Atraumatic. Nose: No congestion/rhinnorhea. Mouth/Throat: Mucous membranes are moist.   Neck: No stridor. Trachea Midline. FROM Cardiovascular: Normal rate, regular rhythm. Grossly normal heart sounds.  Good peripheral circulation. Respiratory: No increased work of breathing, on 4 L nasal cannula, maybe occasional wheeze  Gastrointestinal: Soft but tenderness in her lower abdomen.  No distention. No abdominal bruits.  Musculoskeletal: No lower extremity tenderness nor edema.  No joint effusions. Neurologic:  Normal speech and language. No gross focal neurologic deficits are appreciated.  Skin:  Skin is warm, dry and intact. No rash noted. Psychiatric: Mood and affect are  normal. Speech and behavior are normal. GU: Deferred   ____________________________________________   LABS (all labs ordered are listed, but only abnormal results are displayed)  Labs Reviewed  BASIC METABOLIC PANEL - Abnormal; Notable for the following components:      Result Value   Glucose, Bld 138 (*)    Calcium 8.4 (*)    All other components within normal limits  LACTATE DEHYDROGENASE - Abnormal; Notable for the following components:   LDH 486 (*)    All other components within normal limits  FIBRINOGEN - Abnormal; Notable for the following components:   Fibrinogen 526 (*)    All other components within normal limits  BRAIN NATRIURETIC PEPTIDE - Abnormal; Notable for the following components:   B Natriuretic Peptide 369.0 (*)    All other components within normal limits  HEPATIC FUNCTION PANEL - Abnormal; Notable for the following components:   Total Protein 6.1 (*)    Albumin 3.4 (*)    All other components within normal limits  URINE DRUG SCREEN, QUALITATIVE ([facility] ONLY) - Abnormal; Notable for the following components:   Tricyclic, Ur Screen POSITIVE (*)    Cocaine Metabolite,Ur [place] POSITIVE (*)    Opiate, Ur Screen POSITIVE (*)    Cannabinoid 50 Ng, Ur [place] POSITIVE (*)    All other components within normal limits  CBC - Abnormal; Notable for the following components:   WBC 19.2 (*)    RBC 3.46 (*)    Hemoglobin 11.3 (*)    HCT 35.1 (*)    MCV 101.4 (*)    RDW 17.8 (*)    All other components within normal limits  TROPONIN I (HIGH SENSITIVITY) - Abnormal; Notable for the following components:   Troponin I (High Sensitivity) 42 (*)    All other components within normal limits  TROPONIN I (HIGH SENSITIVITY) - Abnormal; Notable for the following components:   Troponin I (High Sensitivity) 38 (*)    All other components within normal limits  RESPIRATORY PANEL BY RT PCR (FLU A&B, COVID)  CULTURE, BLOOD (ROUTINE X 2)  CULTURE, BLOOD (ROUTINE X 2)  LACTIC ACID, PLASMA   FIBRIN DERIVATIVES D-DIMER ([facility] ONLY)  PROCALCITONIN  FERRITIN  TRIGLYCERIDES  ETHANOL  HCG, QUANTITATIVE, PREGNANCY  C-REACTIVE PROTEIN  POC URINE PREG, ED  POC SARS  CORONAVIRUS 2 AG -  ED  POC SARS CORONAVIRUS 2 AG  POCT PREGNANCY, URINE   ____________________________________________   ED ECG REPORT I, Concha Se, the attending physician, personally viewed and interpreted this ECG.  EKG is normal sinus rate of 87, no ST elevations, T wave inversion lead III, normal intervals ____________________________________________  RADIOLOGY Vela Prose, personally viewed and evaluated these images (plain radiographs) as part of my medical decision making, as well as reviewing the written report by the radiologist.  ED MD interpretation: Bilateral opacifications concerning for Covid  Official radiology report(s): DG Chest Port 1 View  Result Date: 09/21/2019 CLINICAL DATA:  Chest pain.  COVID positive. EXAM: PORTABLE CHEST 1 VIEW COMPARISON:  Chest x-ray dated March 11, 2019. FINDINGS: Stable cardiomediastinal silhouette. Diffuse patchy opacities throughout both lungs, greater on the left. No pleural effusion or pneumothorax. No acute osseous abnormality. IMPRESSION: Diffuse bilateral pulmonary opacities favor atypical infection such as viral pneumonia given clinical history. Electronically Signed   By: Obie Dredge M.D.   On: 09/21/2019 12:22    ____________________________________________   PROCEDURES  Procedure(s) performed (including Critical Care):  .Critical Care Performed by: Concha Se, MD Authorized by: Concha Se, MD   Critical care provider statement:    Critical care time (minutes):  45   Critical care was necessary to treat or prevent imminent or life-threatening deterioration of the following conditions:  Respiratory failure   Critical care was time spent personally by me on the following activities:  Discussions with consultants, evaluation of  patient's response to treatment, examination of patient, ordering and performing treatments and interventions, ordering and review of laboratory studies, ordering and review of radiographic studies, pulse oximetry, re-evaluation of patient's condition, obtaining history from patient or surrogate and review of old charts     ____________________________________________   INITIAL IMPRESSION / ASSESSMENT AND PLAN / ED COURSE   KATHARIN SCHNEIDER was evaluated in Emergency Department on 09/21/2019 for the symptoms described in the history of present illness. She was evaluated in the context of the global COVID-19 pandemic, which necessitated consideration that the patient might be at risk for infection with the SARS-CoV-2 virus that causes COVID-19. Institutional protocols and algorithms that pertain to the evaluation of patients at risk for COVID-19 are in a state of rapid change based on information released by regulatory bodies including the CDC and federal and state organizations. These policies and algorithms were followed during the patient's care in the ED.     Pt presents with SOB.  Patient reports being Covid positive tested today with symptoms been going on for 1 week.  Patient is hypoxic and will require admission.  Will confirm positive testing.  Patient however later on my evaluation started to somewhat full sleep although her respiratory rate looks okay.  Not sure if she has a history of any alcohol or opioid use.  Given her respiratory rate is normal we will hold off on Narcan at this time but if work-up is otherwise negative will consider some Narcan.  Given the pleuritic chest pain in the amount of oxygen that she is on will get CT PE to make sure that there is no evidence of pulmonary embolism.  Given patient's abdominal tenderness will also get a CT abdomen to make sure no evidence of abdominal infection such as appendicitis.  PNA-will get xray to evaluation Anemia-CBC to evaluate ACS-  will get trops Arrhythmia-Will get EKG and keep on monitor.    Troponin is  elevated but stable.  Patient is Covid test was negative although she reports having a positive test with EMS.  Patient's white count is significantly elevated at 19.  Although her procalcitonin is 0.2 which would suggest not need any antibiotics  Patient's drug screen is positive for opioids, cocaine, THC, tricyclic which mostly explains her somnolence.  Patient handed off to oncoming team pending CT scans.  Although her Covid swabs are negative I am concerned that she does have Covid given her x-ray findings.  I also reviewed the CT scan and she does have a lot of opacifications bilaterally.  Patient handed off to oncoming team but I suspect that patient will require admission due to her new respiratory failure requiring oxygen.  Patient has a normal respiratory rate so I do not think that that is causing her to desat and her lungs look terrible on imaging.       ____________________________________________   FINAL CLINICAL IMPRESSION(S) / ED DIAGNOSES   Final diagnoses:  Polysubstance abuse (HCC)  Acute respiratory failure with hypoxia (HCC)  Chest pain, unspecified type     MEDICATIONS GIVEN DURING THIS VISIT:  Medications  sodium chloride flush (NS) 0.9 % injection 3 mL (3 mLs Intravenous Not Given 09/21/19 1320)  lactated ringers bolus 1,000 mL (1,000 mLs Intravenous New Bag/Given 09/21/19 1503)  iohexol (OMNIPAQUE) 350 MG/ML injection 75 mL (75 mLs Intravenous Contrast Given 09/21/19 1431)     ED Discharge Orders    None       Note:  This document was prepared using Dragon voice recognition software and may include unintentional dictation errors.   Concha Se, MD 09/21/19 1508    Concha Se, MD 09/21/19 1524

## 2019-09-21 NOTE — ED Notes (Signed)
Patient was taken to the lobby to wait for a cab that the patient called. Patient states her husband was going to take her to [facility]. Patient states she was irritated that the staff earlier today thought she had overdosed. Patient is able to speak in complete sentences, but does have to pause afterwards. Patient was encouraged to stay, but declined.

## 2019-09-21 NOTE — ED Provider Notes (Signed)
Patient's imaging studies to me look very concerning for COVID-19 infection.  She is hypoxic, correct currently on 3 L nasal cannula.  She is awake alert.  Coughing frequently.  Requesting something for cough, will provide Hycodan for this.  She is fully awake understandable agree with plan for admission  Reports [facility] department came and tested her and her father's home about a week ago and told her her test was positive.  She certainly looks like she should have a positive test there are PCR test is negative.  Plan to recheck a rapid test is at this point, I am concerned that she has COVID-19 and is need of treatment.  Also we have reached out to the health department working to obtain documentation of a positive test.  Discussed with the hospitalist whether or not antibiotic coverage should be initiated, though at this point I feels most strong this is likely nine COVID-19 based on her report of a positive test with symptoms about 1 week   Sharyn Creamer, MD 09/21/19 1608

## 2019-09-21 NOTE — ED Notes (Signed)
This RN collected blood work per EDP order, pt noted to be somnolent, RR 15, pt noted to be arousable with stimulation however fall right back asleep. Pt's respirations noted to be even and unlabored, pt remains on 4L via [place] at this time.

## 2019-09-21 NOTE — ED Notes (Signed)
In and out cath performed by this RN and Shanda Bumps, Charity fundraiser. Pt tolerated well. Pt repositioned in bed.

## 2019-09-21 NOTE — ED Provider Notes (Signed)
I was asked to see the patient and attempt to convince her to stay because she wants to leave AMA.  I spoke with the patient, and she is adamant that she does not wish to stay.  She reports her family is here and picking her up and they are driving her to [facility] to be admitted there.  She adamantly refuses admission.  I also offered to help and assist with arranging transfer to [facility] where she desires to go, but patient refused this.  She advises that she will be leaving here and her family driving her direct to [facility] to be admitted  Patient reports that she is already spoken to her pulmonologist at [facility] as well  I have informed the patient that there is a high risk that her oxygen levels could drop, she could die, and she understands and accepts this risk of death.  Hospitalist is aware, has advised to have her sign out AMA.  Counseled the patient extensively that I thought this was a very poor decision, that it could result in her death or debility or permanent debility such as brain damage etc., but patient refused admission.  She refuses to stay, she wants to go now and is leaving AGAINST MEDICAL ADVICE  Of note the patient is alert well oriented in no distress.  She is able to understand my recommendation and repeat back to me that there is a risk of death and that she could get dramatically worse or even die, but she seems to understand this risk and is willing to take such.  She refused my offer of assistance to transfer  Patient does tell me though that she had does not have any particular concerns with me, but just does not in general like this hospital system. Refuses to stay. Going AMA   Sharyn Creamer, MD 09/21/19 1901

## 2019-09-21 NOTE — ED Notes (Signed)
Patient has phone and is calling for ride to residence. Dr. Georgeann Oppenheim suggested that Dr. Fanny Bien talk to patient, otherwise she can leave AMA. Patient given menstrual pad per request.

## 2019-09-21 NOTE — ED Notes (Signed)
Plastic shopping bag was in exam room with patient medications. This technician updated the patient's home medications based upon the medications in the bag. The bottles were transferred to a large plastic ziplock bag and labeled with patient's hospital sticker. There were also many loose pink tablets in the original bag that appear to be DIPHENHYDRAMINE. These were placed in a separate 'biohazard' bag and placed inside the larger ziplock.   ** The above is intended solely for informational and/or communicative purposes. It should in no way be considered an endorsement of any specific treatment, therapy or action. **

## 2019-09-21 NOTE — ED Triage Notes (Signed)
Pt presents to ED via [facility] from home with c/o CP that started approx 1 week ago that has progressively gotten worse, per EMS pain 6/10, reports took 400mg  Ibuprofen and 1 pill of benadryl. Per EMS pt lost consciousness approx 5 mins PTA. Pt stated to EMS that she tested positive for Covid this morning by EMS and a doctor on scene. Per EMS pt "lost consciousness" approx 5 mins PTA, pt immediately regained consciousness upon arrival to room and is A&O x 4 with NAD noted at this time.   Pt states pain "goes from side to side and then my back". Pt describes pain as "it's like it's on fire then it's stabbing".   Pt is noted to be pale on arrival to ED, RA sats 78%.

## 2019-09-21 NOTE — ED Notes (Signed)
Patient declined to be placed back on O2 or monitoring leads, cuff or pulse ox. Patient was placed in a clean gown and fluids are still infusing.  MD aware.

## 2019-09-22 NOTE — Discharge Summary (Signed)
AMA discharge summary  Patient is a 34 year old female who was admitted to the hospitalist service for severe multifocal pneumonia of unclear etiology and hypoxic respiratory failure.  On my interview patient was clearly in some respiratory distress, complaining of severe cough, I have performed an extensive history and physical.  At the time of my interview I try to be as blunt and directed the patient about the gravity of her situation.  She seemed to understand and express gratitude.  I also make inquiries about her positive urine drug screen.  She did admit to intranasal cocaine use and cannabinoid use however stated that the positive opioids were secondary to opioid-containing cough suppression medication she has been taking.  Approximately 30 minutes after I admitted the patient I received a message from the bedside nurse stating that patient wanted to leave AGAINST MEDICAL ADVICE.  I just left the building at that time however I encouraged the nurse to explain to the patient that given the gravity of her situation and the imminent risk of respiratory decompensation and death.  I also requested that the ED physician if available to go and discussed with patient and attempt to convince to stay.  Note and assistance greatly appreciated from ED provider Sharyn Creamer MD. patient was convinced that she would be better served at [facility] and had called friend or family member for transport.  As the patient had decision making capabilities there was no indication for involuntary hold.  Patient is very high risk for respiratory decompensation and death if urgent medical attention is not sought.  Lolita Patella MD

## 2019-09-23 MED ORDER — GUAIFENESIN 100 MG/5ML PO SYRP
200.00 | ORAL_SOLUTION | ORAL | Status: DC
Start: ? — End: 2019-09-23

## 2019-09-23 MED ORDER — GENERIC EXTERNAL MEDICATION
Status: DC
Start: ? — End: 2019-09-23

## 2019-09-23 MED ORDER — FLUTICASONE PROPIONATE 50 MCG/ACT NA SUSP
1.00 | NASAL | Status: DC
Start: 2019-09-24 — End: 2019-09-23

## 2019-09-23 MED ORDER — ACETAMINOPHEN 325 MG PO TABS
650.00 | ORAL_TABLET | ORAL | Status: DC
Start: ? — End: 2019-09-23

## 2019-09-23 MED ORDER — CELECOXIB 100 MG PO CAPS
100.00 | ORAL_CAPSULE | ORAL | Status: DC
Start: 2019-09-23 — End: 2019-09-23

## 2019-09-23 MED ORDER — ENOXAPARIN SODIUM 40 MG/0.4ML ~~LOC~~ SOLN
40.00 | SUBCUTANEOUS | Status: DC
Start: 2019-09-24 — End: 2019-09-23

## 2019-09-23 MED ORDER — GENERIC EXTERNAL MEDICATION
250.00 | Status: DC
Start: 2019-09-24 — End: 2019-09-23

## 2019-09-23 MED ORDER — IPRATROPIUM BROMIDE 0.02 % IN SOLN
500.00 | RESPIRATORY_TRACT | Status: DC
Start: 2019-09-23 — End: 2019-09-23

## 2019-09-23 MED ORDER — TRAZODONE HCL 100 MG PO TABS
100.00 | ORAL_TABLET | ORAL | Status: DC
Start: 2019-09-23 — End: 2019-09-23

## 2019-09-23 MED ORDER — FOLIC ACID 1 MG PO TABS
1.00 | ORAL_TABLET | ORAL | Status: DC
Start: 2019-09-24 — End: 2019-09-23

## 2019-09-23 MED ORDER — DIVALPROEX SODIUM ER 500 MG PO TB24
2000.00 | ORAL_TABLET | ORAL | Status: DC
Start: 2019-09-23 — End: 2019-09-23

## 2019-09-23 MED ORDER — PROPRANOLOL HCL 10 MG PO TABS
10.00 | ORAL_TABLET | ORAL | Status: DC
Start: ? — End: 2019-09-23

## 2019-09-23 MED ORDER — FUROSEMIDE 20 MG PO TABS
20.00 | ORAL_TABLET | ORAL | Status: DC
Start: 2019-09-24 — End: 2019-09-23

## 2019-09-23 MED ORDER — METHOTREXATE 2.5 MG PO TABS
20.00 | ORAL_TABLET | ORAL | Status: DC
Start: 2019-09-29 — End: 2019-09-23

## 2019-09-23 MED ORDER — LIDOCAINE 5 % EX PTCH
1.00 | MEDICATED_PATCH | CUTANEOUS | Status: DC
Start: 2019-09-24 — End: 2019-09-23

## 2019-09-23 MED ORDER — TIZANIDINE HCL 4 MG PO TABS
4.00 | ORAL_TABLET | ORAL | Status: DC
Start: ? — End: 2019-09-23

## 2019-09-23 MED ORDER — GENERIC EXTERNAL MEDICATION
1.00 | Status: DC
Start: 2019-09-24 — End: 2019-09-23

## 2019-09-23 MED ORDER — ALBUTEROL SULFATE (2.5 MG/3ML) 0.083% IN NEBU
2.50 | INHALATION_SOLUTION | RESPIRATORY_TRACT | Status: DC
Start: ? — End: 2019-09-23

## 2019-09-23 MED ORDER — LORATADINE 10 MG PO TABS
10.00 | ORAL_TABLET | ORAL | Status: DC
Start: 2019-09-24 — End: 2019-09-23

## 2019-09-26 LAB — CULTURE, BLOOD (ROUTINE X 2)
Culture: NO GROWTH
Culture: NO GROWTH
Special Requests: ADEQUATE

## 2019-10-11 ENCOUNTER — Ambulatory Visit: Payer: Self-pay

## 2020-11-08 ENCOUNTER — Other Ambulatory Visit: Payer: Self-pay

## 2020-11-08 ENCOUNTER — Emergency Department: Payer: Self-pay

## 2020-11-08 ENCOUNTER — Emergency Department
Admission: EM | Admit: 2020-11-08 | Discharge: 2020-11-10 | Disposition: A | Discharge status: Home / Self Care | Payer: Self-pay | Attending: Emergency Medicine | Admitting: Emergency Medicine

## 2020-11-08 DIAGNOSIS — Z7951 Long term (current) use of inhaled steroids: Secondary | ICD-10-CM | POA: Insufficient documentation

## 2020-11-08 DIAGNOSIS — F19951 Other psychoactive substance use, unspecified with psychoactive substance-induced psychotic disorder with hallucinations: Secondary | ICD-10-CM | POA: Insufficient documentation

## 2020-11-08 DIAGNOSIS — J45909 Unspecified asthma, uncomplicated: Secondary | ICD-10-CM | POA: Insufficient documentation

## 2020-11-08 DIAGNOSIS — Z20822 Contact with and (suspected) exposure to covid-19: Secondary | ICD-10-CM | POA: Insufficient documentation

## 2020-11-08 DIAGNOSIS — F22 Delusional disorders: Secondary | ICD-10-CM

## 2020-11-08 DIAGNOSIS — J449 Chronic obstructive pulmonary disease, unspecified: Secondary | ICD-10-CM | POA: Insufficient documentation

## 2020-11-08 DIAGNOSIS — I509 Heart failure, unspecified: Secondary | ICD-10-CM | POA: Insufficient documentation

## 2020-11-08 DIAGNOSIS — F19959 Other psychoactive substance use, unspecified with psychoactive substance-induced psychotic disorder, unspecified: Secondary | ICD-10-CM

## 2020-11-08 DIAGNOSIS — F17210 Nicotine dependence, cigarettes, uncomplicated: Secondary | ICD-10-CM | POA: Insufficient documentation

## 2020-11-08 DIAGNOSIS — J189 Pneumonia, unspecified organism: Secondary | ICD-10-CM | POA: Insufficient documentation

## 2020-11-08 LAB — CBC WITH DIFFERENTIAL/PLATELET
Abs Immature Granulocytes: 0.09 10*3/uL — ABNORMAL HIGH (ref 0.00–0.07)
Basophils Absolute: 0.1 10*3/uL (ref 0.0–0.1)
Basophils Relative: 0 %
Eosinophils Absolute: 0.1 10*3/uL (ref 0.0–0.5)
Eosinophils Relative: 1 %
HCT: 39.2 % (ref 36.0–46.0)
Hemoglobin: 12.4 g/dL (ref 12.0–15.0)
Immature Granulocytes: 1 %
Lymphocytes Relative: 22 %
Lymphs Abs: 3.2 10*3/uL (ref 0.7–4.0)
MCH: 29.6 pg (ref 26.0–34.0)
MCHC: 31.6 g/dL (ref 30.0–36.0)
MCV: 93.6 fL (ref 80.0–100.0)
Monocytes Absolute: 1.7 10*3/uL — ABNORMAL HIGH (ref 0.1–1.0)
Monocytes Relative: 12 %
Neutro Abs: 9.4 10*3/uL — ABNORMAL HIGH (ref 1.7–7.7)
Neutrophils Relative %: 64 %
Platelets: 201 10*3/uL (ref 150–400)
RBC: 4.19 MIL/uL (ref 3.87–5.11)
RDW: 17.7 % — ABNORMAL HIGH (ref 11.5–15.5)
WBC: 14.5 10*3/uL — ABNORMAL HIGH (ref 4.0–10.5)
nRBC: 0 % (ref 0.0–0.2)

## 2020-11-08 LAB — COMPREHENSIVE METABOLIC PANEL
ALT: 17 U/L (ref 0–44)
AST: 22 U/L (ref 15–41)
Albumin: 4.3 g/dL (ref 3.5–5.0)
Alkaline Phosphatase: 70 U/L (ref 38–126)
Anion gap: 10 (ref 5–15)
BUN: 9 mg/dL (ref 6–20)
CO2: 26 mmol/L (ref 22–32)
Calcium: 9 mg/dL (ref 8.9–10.3)
Chloride: 101 mmol/L (ref 98–111)
Creatinine, Ser: 0.68 mg/dL (ref 0.44–1.00)
GFR, Estimated: >60 mL/min (ref >60)
Glucose, Bld: 131 mg/dL — ABNORMAL HIGH (ref 70–99)
Potassium: 3.5 mmol/L (ref 3.5–5.1)
Sodium: 137 mmol/L (ref 135–145)
Total Bilirubin: 0.9 mg/dL (ref 0.3–1.2)
Total Protein: 7.5 g/dL (ref 6.5–8.1)

## 2020-11-08 LAB — ETHANOL: Alcohol, Ethyl (B): <10 mg/dL (ref <10)

## 2020-11-08 LAB — RESP PANEL BY RT-PCR (FLU A&B, COVID) ARPGX2
Influenza A by PCR: NEGATIVE
Influenza B by PCR: NEGATIVE
SARS Coronavirus 2 by RT PCR: NEGATIVE

## 2020-11-08 LAB — PROCALCITONIN: Procalcitonin: <0.1 ng/mL

## 2020-11-08 LAB — EXPECTORATED SPUTUM ASSESSMENT W GRAM STAIN, RFLX TO RESP C

## 2020-11-08 LAB — CBC
HCT: 40.3 % (ref 36.0–46.0)
Hemoglobin: 12.7 g/dL (ref 12.0–15.0)
MCH: 29.4 pg (ref 26.0–34.0)
MCHC: 31.5 g/dL (ref 30.0–36.0)
MCV: 93.3 fL (ref 80.0–100.0)
Platelets: 152 10*3/uL (ref 150–400)
RBC: 4.32 MIL/uL (ref 3.87–5.11)
RDW: 17.7 % — ABNORMAL HIGH (ref 11.5–15.5)
WBC: 14.1 10*3/uL — ABNORMAL HIGH (ref 4.0–10.5)
nRBC: 0 % (ref 0.0–0.2)

## 2020-11-08 LAB — URINE DRUG SCREEN, QUALITATIVE (ARMC ONLY)
Amphetamines, Ur Screen: NOT DETECTED
Barbiturates, Ur Screen: NOT DETECTED
Benzodiazepine, Ur Scrn: POSITIVE — AB
Cannabinoid 50 Ng, Ur ~~LOC~~: NOT DETECTED
Cocaine Metabolite,Ur ~~LOC~~: POSITIVE — AB
MDMA (Ecstasy)Ur Screen: NOT DETECTED
Methadone Scn, Ur: NOT DETECTED
Opiate, Ur Screen: POSITIVE — AB
Phencyclidine (PCP) Ur S: NOT DETECTED
Tricyclic, Ur Screen: POSITIVE — AB

## 2020-11-08 LAB — SALICYLATE LEVEL: Salicylate Lvl: <7 mg/dL — ABNORMAL LOW (ref 7.0–30.0)

## 2020-11-08 LAB — ACETAMINOPHEN LEVEL: Acetaminophen (Tylenol), Serum: <10 ug/mL — ABNORMAL LOW (ref 10–30)

## 2020-11-08 MED ORDER — HALOPERIDOL LACTATE 5 MG/ML IJ SOLN
5.0000 mg | Freq: Once | INTRAMUSCULAR | Status: AC
  Administered 2020-11-08: 5 mg via INTRAMUSCULAR
  Filled 2020-11-08: qty 1

## 2020-11-08 MED ORDER — LORAZEPAM 2 MG/ML IJ SOLN
1.0000 mg | Freq: Once | INTRAMUSCULAR | Status: AC
  Administered 2020-11-08: 1 mg via INTRAMUSCULAR
  Filled 2020-11-08: qty 1

## 2020-11-08 MED ORDER — DIPHENHYDRAMINE HCL 50 MG/ML IJ SOLN
50.0000 mg | Freq: Once | INTRAMUSCULAR | Status: AC
  Administered 2020-11-08: 50 mg via INTRAMUSCULAR
  Filled 2020-11-08: qty 1

## 2020-11-08 MED ORDER — LORAZEPAM 1 MG PO TABS
1.0000 mg | ORAL_TABLET | Freq: Once | ORAL | Status: AC
  Administered 2020-11-08: 1 mg via ORAL
  Filled 2020-11-08: qty 1

## 2020-11-08 MED ORDER — LEVOFLOXACIN 750 MG PO TABS
750.0000 mg | ORAL_TABLET | Freq: Every day | ORAL | Status: DC
  Administered 2020-11-08 – 2020-11-09 (×2): 750 mg via ORAL
  Filled 2020-11-08 (×3): qty 1

## 2020-11-08 NOTE — ED Notes (Signed)
Breakfast tray given. No other needs found at this moment.  

## 2020-11-08 NOTE — ED Notes (Signed)
Pt continues to takes off her clothes and yell from her room.  Pt now believes worms are coming out of her legs.  EDP made aware and medications given as ordered.

## 2020-11-08 NOTE — ED Notes (Signed)
Pt just woke up and requested a drink. Drink provided

## 2020-11-08 NOTE — ED Triage Notes (Signed)
Pt via ems from home complaining of coughing up worms. Pt states she feel like worms are coming out of her skin.  Pt also endorses bilateral leg pain. Pt with fever yesterday states she took tylenol temp in triage 99.2.

## 2020-11-08 NOTE — ED Notes (Signed)
Unable to get blood work at this time d/t pt constantly moving in seat and unable to sit still, Amy RN notified in quad

## 2020-11-08 NOTE — ED Provider Notes (Signed)
[facility] Emergency Department Provider Note  ____________________________________________   Event Date/Time   First MD Initiated Contact with Patient 11/08/20 [phone]     (approximate)  I have reviewed the triage vital signs and the nursing notes.   HISTORY  Chief Complaint Psychiatric Evaluation   HPI Jennifer Vasquez is a 35 y.o. female with a past medical history of COPD, CHF, asthma, anxiety, carpal tunnel syndrome, hep C and bipolar disorder on Depakote states she is compliant with all her medicines who presents for assessment of 3 to 4 days of "coughing up worms" and aches everywhere.  Patient states she has worms in her poop and her vaginal discharge coming from her skin.  She states she feels sore all over.  She denies any specific chest pain, shortness of breath endorses some mild abdominal crampiness.  No specific headache, earache, sore throat, back pain, rash or focal extremity pain.  No recent falls or injuries.  She denies any illicit drug use, EtOH use or prior similar episodes.  She states she sometimes walks barefoot outside but has not had any recent travel outside [place].  She is not sure if she is vaccinated any dogs or other pets recently.  Immunizations are up-to-date.  Patient does report she has not slept well in over a week.         Past Medical History:  Diagnosis Date  . Acute carpal tunnel syndrome 10/10/2014  . Anxiety state 09/03/2009  . Asthma   . Bipolar disorder (HCC) 02/09/2013  . CHF (congestive heart failure) (HCC)   . COPD (chronic obstructive pulmonary disease) (HCC)   . DDD (degenerative disc disease), cervical 11/13/2014  . DDD (degenerative disc disease), lumbar 11/13/2014  . Hepatitis C   . Lumbosacral radiculopathy 11/13/2014  . Tobacco use disorder 09/06/2012    Patient Active Problem List   Diagnosis Date Noted  . Multifocal pneumonia 09/21/2019  . Sepsis due to pneumonia (HCC) 07/05/2018  . Community  acquired pneumonia of left lung   . Respiratory failure (HCC) 11/05/2016  . DDD (degenerative disc disease), lumbar 11/13/2014  . DDD (degenerative disc disease), cervical 11/13/2014  . Lumbosacral radiculopathy 11/13/2014  . Acute carpal tunnel syndrome 10/10/2014  . Chronic pain 03/02/2013  . Depression 03/02/2013  . Bipolar disorder (HCC) 02/09/2013  . Hepatitis C 12/18/2012  . Asthma 11/23/2012  . Neck pain 11/23/2012  . Torticollis 11/23/2012  . Tobacco use disorder 09/06/2012  . Anxiety state 09/03/2009    Past Surgical History:  Procedure Laterality Date  . CERVICAL DISC ARTHROPLASTY    . TONSILLECTOMY    . TUBAL LIGATION      Prior to Admission medications   Medication Sig Start Date End Date Taking? Authorizing Provider  acetaminophen (TYLENOL) 500 MG tablet Take 500-1,000 mg by mouth every 6 (six) hours as needed for mild pain or moderate pain.    [provider]  albuterol (PROVENTIL HFA;VENTOLIN HFA) 108 (90 Base) MCG/ACT inhaler Inhale 2 puffs into the lungs every 6 (six) hours as needed for wheezing or shortness of breath. 01/17/18   Merrily Brittle, MD  albuterol (PROVENTIL) (2.5 MG/3ML) 0.083% nebulizer solution Take 2.5 mg by nebulization every 4 (four) hours as needed for wheezing or shortness of breath.    [provider]  budesonide-formoterol (SYMBICORT) 160-4.5 MCG/ACT inhaler Inhale 2 puffs into the lungs 2 (two) times daily. 07/13/19 07/12/20  [provider]  clonazePAM (KLONOPIN) 0.5 MG tablet Take 0.5 mg by mouth at  bedtime as needed (sleep).     [provider]  dextromethorphan-guaiFENesin (MUCINEX DM) 30-600 MG 12hr tablet Take 1 tablet by mouth 2 (two) times daily as needed for cough (congestion).     [provider]  divalproex (DEPAKOTE ER) 500 MG 24 hr tablet Take 2,000 mg by mouth daily. 10/11/19 11/10/19  [provider]  docusate sodium (COLACE) 100 MG capsule Take 100 mg by mouth daily as needed for  mild constipation.    [provider]  famotidine-calcium carbonate-magnesium hydroxide (PEPCID COMPLETE) 10-800-165 MG chewable tablet Chew 1 tablet by mouth 2 (two) times daily as needed (heartburn or stomach acid).    [provider]  ferrous sulfate 325 (65 FE) MG tablet Take 325 mg by mouth daily with breakfast.    [provider]  furosemide (LASIX) 20 MG tablet Take 20 mg by mouth daily.     [provider]  hydrOXYzine (ATARAX/VISTARIL) 25 MG tablet Take 50 mg by mouth every 6 (six) hours as needed for anxiety. 07/18/19   [provider]  levothyroxine (SYNTHROID, LEVOTHROID) 25 MCG tablet Take 25 mcg by mouth daily before breakfast.    [provider]  lidocaine (LIDODERM) 5 % Place 1 patch onto the skin daily. Remove & Discard patch within 12 hours or as directed by MD 04/27/19   Enid Derry, PA-C  loratadine (CLARITIN) 10 MG tablet Take 10 mg by mouth daily.    [provider]  methotrexate (RHEUMATREX) 2.5 MG tablet Take 20 mg by mouth every Sunday.  07/11/19   [provider]  metoprolol succinate (TOPROL-XL) 25 MG 24 hr tablet Take 25 mg by mouth daily.    [provider]  naproxen sodium (ALEVE) 220 MG tablet Take 220-440 mg by mouth 2 (two) times daily as needed (pain).    [provider]  pregabalin (LYRICA) 25 MG capsule Take 25 mg by mouth 3 (three) times daily. 09/03/19   [provider]  propranolol (INDERAL) 10 MG tablet Take 10 mg by mouth 2 (two) times daily as needed for anxiety. 09/10/19 12/09/19  [provider]  sodium chloride (OCEAN) 0.65 % SOLN nasal spray Place 1 spray into both nostrils as needed for congestion.    [provider]  Tiotropium Bromide Monohydrate 2.5 MCG/ACT AERS Inhale 1 puff into the lungs daily.    [provider]  tiZANidine (ZANAFLEX) 4 MG tablet Take 4 mg by mouth every 6 (six) hours as needed for muscle spasms.     [provider]  traMADol (ULTRAM) 50 MG tablet Take 50 mg by mouth at bedtime as needed for severe pain.     [provider]  traZODone (DESYREL) 100 MG tablet Take 100 mg by mouth at bedtime.    [provider]    Allergies Trazodone and nefazodone, Acetaminophen, Amoxicillin-pot clavulanate, Gabapentin, Tetanus toxoids, Tramadol, and Shellfish allergy  Family History  Problem Relation Age of Onset  . Arthritis Mother   . Asthma Mother   . Diabetes Mother   . Hyperlipidemia Mother   . Vision loss Mother   . Alcohol abuse Father   . Asthma Father   . Diabetes Father   . Heart disease Father   . Depression Father   . COPD Father   . Cancer Father   . Hypertension Father   . Kidney disease Father   . Vision loss Father     Social History Social History   Tobacco Use  .  Smoking status: Current Some Day Smoker    Packs/day: 0.50    Types: Cigarettes  . Smokeless tobacco: Never Used  Vaping Use  . Vaping Use: Never used  Substance Use Topics  . Alcohol use: Not Currently    Alcohol/week: 0.0 standard drinks  . Drug use: No    Review of Systems  Review of Systems  Constitutional: Negative for chills and fever.  HENT: Negative for sore throat.   Eyes: Negative for pain.  Respiratory: Negative for cough and stridor.   Cardiovascular: Negative for chest pain.  Gastrointestinal: Negative for vomiting.  Genitourinary: Negative for dysuria.  Musculoskeletal: Positive for myalgias.  Skin: Negative for rash.  Neurological: Negative for seizures, loss of consciousness and headaches.  Psychiatric/Behavioral: Negative for substance abuse and suicidal ideas. The patient is nervous/anxious and has insomnia.   All other systems reviewed and are negative.     ____________________________________________   PHYSICAL EXAM:  VITAL SIGNS: ED Triage Vitals  Enc Vitals Group     BP 11/08/20 0651 129/77     Pulse Rate 11/08/20 0651 97     Resp 11/08/20 0651  18     Temp 11/08/20 0651 99.2 F (37.3 C)     Temp Source 11/08/20 0651 Oral     SpO2 11/08/20 0651 96 %     Weight 11/08/20 0653 203 lb (92.1 kg)     Height 11/08/20 0653  (1.549 m)     Head Circumference --      Peak Flow --      Pain Score 11/08/20 0652 10     Pain Loc --      Pain Edu? --      Excl. in GC? --    Vitals:   11/08/20 0651  BP: 129/77  Pulse: 97  Resp: 18  Temp: 99.2 F (37.3 C)  SpO2: 96%   Physical Exam Vitals and nursing note reviewed.  Constitutional:      General: She is not in acute distress.    Appearance: She is well-developed.  HENT:     Head: Normocephalic and atraumatic.     Right Ear: External ear normal.     Left Ear: External ear normal.     Nose: Nose normal.  Eyes:     Conjunctiva/sclera: Conjunctivae normal.  Cardiovascular:     Rate and Rhythm: Normal rate and regular rhythm.     Heart sounds: No murmur heard.   Pulmonary:     Effort: Pulmonary effort is normal. No respiratory distress.     Breath sounds: Normal breath sounds.  Abdominal:     Palpations: Abdomen is soft.     Tenderness: There is no abdominal tenderness.  Musculoskeletal:     Cervical back: Neck supple.  Skin:    General: Skin is warm and dry.  Neurological:     Mental Status: She is alert.  Psychiatric:        Mood and Affect: Affect is labile and tearful.        Speech: Speech is rapid and pressured.        Thought Content: Thought content is delusional. Thought content does not include homicidal or suicidal ideation.        Cognition and Memory: Cognition is impaired.     Oropharynx is unremarkable.  Patient is extremely tender to light touch in all extremities her back chest and abdomen although no or is focally more tender than anywhere else and no overlying skin changes over her  chest back abdomen arms or legs.  2+ bilateral radial pulses.  Sensation intact light touch of all extremities.  PERRLA.   EOMI. ____________________________________________   LABS (all labs ordered are listed, but only abnormal results are displayed)  Labs Reviewed  COMPREHENSIVE METABOLIC PANEL - Abnormal; Notable for the following components:      Result Value   Glucose, Bld 131 (*)    All other components within normal limits  SALICYLATE LEVEL - Abnormal; Notable for the following components:   Salicylate Lvl <7.0 (*)    All other components within normal limits  ACETAMINOPHEN LEVEL - Abnormal; Notable for the following components:   Acetaminophen (Tylenol), Serum <10 (*)    All other components within normal limits  CBC - Abnormal; Notable for the following components:   WBC 14.1 (*)    RDW 17.7 (*)    All other components within normal limits  CBC WITH DIFFERENTIAL/PLATELET - Abnormal; Notable for the following components:   WBC 14.5 (*)    RDW 17.7 (*)    Neutro Abs 9.4 (*)    Monocytes Absolute 1.7 (*)    Abs Immature Granulocytes 0.09 (*)    All other components within normal limits  RESP PANEL BY RT-PCR (FLU A&B, COVID) ARPGX2  EXPECTORATED SPUTUM ASSESSMENT W GRAM STAIN, RFLX TO RESP C  ETHANOL  PROCALCITONIN  URINE DRUG SCREEN, QUALITATIVE ([facility] ONLY)  VALPROIC ACID LEVEL  HIV ANTIBODY (ROUTINE TESTING W REFLEX)  DIFFERENTIAL  POC URINE PREG, ED   ____________________________________________  EKG  ____________________________________________  RADIOLOGY  ED MD interpretation: Bilateral multifocal pneumonia without clear edema, pneumothorax, effusion or other clear acute intrathoracic process.  Official radiology report(s): DG Chest 2 View  Result Date: 11/08/2020 CLINICAL DATA:  Cough EXAM: CHEST - 2 VIEW COMPARISON:  09/21/2019 FINDINGS: Bilateral interstitial and alveolar airspace disease. No pleural effusion or pneumothorax. Stable cardiomediastinal silhouette. No aggressive osseous lesion. Prior lower anterior cervical fusion. IMPRESSION: Bilateral interstitial and  alveolar airspace disease concerning for multilobar pneumonia similar in appearance to the prior exam. Electronically Signed   By: Elige Ko   On: 11/08/2020 08:05    ____________________________________________   PROCEDURES  Procedure(s) performed (including Critical Care):  Procedures   ____________________________________________   INITIAL IMPRESSION / ASSESSMENT AND PLAN / ED COURSE      Presents with above-stated history exam for assessment stating she has cut her Arms.  She does present this examiner some sputum tissue and then we will visualize any worms.  Just endorses some myalgias and a cough.  She has a history of bipolar disorder but states she is compliant with her Depakote.  She does not appear overtly psychotic or intoxicated on exam of the wrist.  Tearful quite uncomfortable.  She is afebrile and hemodynamically stable.  She denies any recent toxic ingestion.  Differential includes possible parasitic infection, delusional parasitosis, decompensation of patient's bipolar disorder, delirium from metabolic derangements or acute infectious process ingestion.  No historical or exam features to suggest acute trauma.  Given reports of little to no sleep over the last week and concern for possible decompensated bipolar disorder.  Regarding patient's cough chest x-ray shows concerns for possible bilateral multifocal pneumonia and given elevated white count will treat for CAP.  Patient was hospitalized last month for pneumonia and left AMA although Evalose patient for MRSA or Pseudomonas at this time.  In addition there is no evidence of volume overload on chest x-ray or exam.  We will send COVID and flu.  Certainly possible the patient has viral bronchitis as well as not COVID or flu.  We will treat with levofloxacin for CAP.  Given concern for possible bipolar decompensation psychiatry TTS consulted.  The patient has been placed in psychiatric observation due to the need to  provide a safe environment for the patient while obtaining psychiatric consultation and evaluation, as well as ongoing medical and medication management to treat the patient's condition.  The patient has not been placed under full IVC at this time.       ____________________________________________   FINAL CLINICAL IMPRESSION(S) / ED DIAGNOSES  Final diagnoses:  Community acquired pneumonia, unspecified laterality  Delusion (HCC)    Medications  levofloxacin (LEVAQUIN) tablet 750 mg (750 mg Oral Given 11/08/20 0844)  LORazepam (ATIVAN) tablet 1 mg (1 mg Oral Given 11/08/20 0747)  haloperidol lactate (HALDOL) injection 5 mg (5 mg Intramuscular Given 11/08/20 0845)  diphenhydrAMINE (BENADRYL) injection 50 mg (50 mg Intramuscular Given 11/08/20 0845)  LORazepam (ATIVAN) injection 1 mg (1 mg Intramuscular Given 11/08/20 0844)     ED Discharge Orders    None       Note:  This document was prepared using Dragon voice recognition software and may include unintentional dictation errors.   Gilles Chiquito, MD 11/08/20 1145

## 2020-11-08 NOTE — ED Notes (Signed)
Pt was given phone and told her Dad had called a couple of times to check on her. Pt said she would call him back. Pt given phone then changed her mind and said she wouldn't call  Him until she knew anything. Pt was also given another blanket per request.

## 2020-11-08 NOTE — ED Notes (Signed)
Pt is laying in bed wearing only underwear.  Pt is hollering and thrashing around in bed.

## 2020-11-08 NOTE — ED Notes (Signed)
Hourly rounding reveals patient in room. No complaints, stable, in no acute distress. Q15 minute rounds and monitoring via Rover and Officer to continue.   

## 2020-11-08 NOTE — ED Notes (Signed)
Pt asleep after IM medications.  VS will be taken once awake.

## 2020-11-08 NOTE — ED Notes (Signed)
Per pt's request a cup of ice was provided

## 2020-11-08 NOTE — ED Notes (Signed)
Difficult to collect clean sputum sample from patient. Pt touches all samples in attempt to find the worms.

## 2020-11-08 NOTE — ED Notes (Signed)
Pt's father called to check on patient.  RN explained the patient was sleeping at this time and waiting to see the doctor.   Pt will be asked to call her fatehr when she wakes.

## 2020-11-08 NOTE — ED Notes (Signed)
Report from Amy RN. Patient sleeping but easily arousal. Respirations regular and unlabored. Q15 minute rounds and observation by Psychologist, counselling to continue.

## 2020-11-08 NOTE — BH Assessment (Signed)
Writer unable to complete consult at this time. Patient giving IM medications and unable to participate in the interview.

## 2020-11-08 NOTE — ED Notes (Signed)
Pt dressed out with this RN and French Ana NT. Belongings include: Black shorts burgandy tank top [employer] Black purse Gray bra [employer]  hairbow   Pt repeatedly sticking out tongue stating that she has worms, squirming in seat and spitting. No bugs noted. Pr tearful with wet hair, moaning and crying

## 2020-11-08 NOTE — ED Notes (Signed)
Snack and beverage given. 

## 2020-11-08 NOTE — ED Notes (Signed)
Pt given emesis bag for her to spit in.  Pt has taken her shirt off.  Pt states she feel like she is being electrocuted - burning her body, hands and feet.

## 2020-11-08 NOTE — ED Notes (Signed)
Pt given dinner tray.

## 2020-11-09 DIAGNOSIS — F19959 Other psychoactive substance use, unspecified with psychoactive substance-induced psychotic disorder, unspecified: Secondary | ICD-10-CM

## 2020-11-09 LAB — VALPROIC ACID LEVEL: Valproic Acid Lvl: <10 ug/mL — ABNORMAL LOW (ref 50.0–100.0)

## 2020-11-09 LAB — HIV ANTIBODY (ROUTINE TESTING W REFLEX): HIV Screen 4th Generation wRfx: NONREACTIVE

## 2020-11-09 MED ORDER — IBUPROFEN 400 MG PO TABS
400.0000 mg | ORAL_TABLET | Freq: Once | ORAL | Status: AC
  Administered 2020-11-09: 400 mg via ORAL
  Filled 2020-11-09: qty 1

## 2020-11-09 MED ORDER — MOMETASONE FURO-FORMOTEROL FUM 200-5 MCG/ACT IN AERO
2.0000 | INHALATION_SPRAY | Freq: Two times a day (BID) | RESPIRATORY_TRACT | Status: DC
  Filled 2020-11-09: qty 8.8

## 2020-11-09 MED ORDER — ALBUTEROL SULFATE HFA 108 (90 BASE) MCG/ACT IN AERS
2.0000 | INHALATION_SPRAY | Freq: Four times a day (QID) | RESPIRATORY_TRACT | Status: DC | PRN
  Filled 2020-11-09: qty 6.7

## 2020-11-09 NOTE — ED Notes (Signed)
Hourly rounding reveals patient in room. No complaints, stable, in no acute distress. Q15 minute rounds and monitoring via Rover and Officer to continue.   

## 2020-11-09 NOTE — ED Notes (Signed)
Checked vitals and gave breakfast tray with juice.

## 2020-11-09 NOTE — BH Assessment (Signed)
Comprehensive Clinical Assessment (CCA) Note  11/09/2020 Jennifer Vasquez [id number] Recommendations for Services/Supports/Treatments: Consulted with Jennifer Vasquez., NP, who explained that the pt. is recommended for overnight observation and reassessment in the AM. Notified Dr. Dolores Vasquez and Jennifer Axon, RN of disposition recommendation.   Jennifer Vasquez is a 35 year old patient who presented to [facility] ED, voluntarily complaining of worms coming out of her skin and poop. Pt was drowsy and oriented x5. It was noted that the pt. was cooperative throughout the assessment. When asked what brought her to the ED the pt stated, "Itching and stinging". The pt denied that the itchy/stinging sensations were present during the assessment. Thought processes were relevant to the content of the conversation. Pt was slow to respond; however, her speech was coherent. Pt presented with sullen mood and affect. The pt was noted to be drowsy and nodded in and out throughout the assessment. The pt denied any substance use prior to being admitted. Pt explained that her last use was 6 months ago despite having a UDS positive for benzos, cocaine, opiates, and tricyclics. The pt admitted to sleep difficulties. The patient denied NSSIB, SI, HI, and AV/H.  Flowsheet Row ED from 11/08/2020 in [facility]  C-SSRS RISK CATEGORY No Risk     The patient demonstrates the following risk factors for suicide: Chronic risk factors for suicide include: psychiatric disorder of biploar disorder. Acute risk factors for suicide include: N/A. Protective factors for this patient include: positive therapeutic relationship. Considering these factors, the overall suicide risk at this point appears to be low. Patient is not appropriate for outpatient follow up.  Therefore, a 1:1 sitter for suicide recommendations is not recommended.   Chief Complaint:  Chief Complaint  Patient presents with  . Psychiatric Evaluation   Visit  Diagnosis: Psychoactive substance-induced psychosis     CCA Screening, Triage and Referral (STR)  Patient Reported Information How did you hear about [place]? No data recorded Referral name: No data recorded Referral phone number: No data recorded  Whom do you see for routine medical problems? Primary Care  Practice/Facility Name: [facility]  Practice/Facility Phone Number: [phone]  Name of Contact: No data recorded Contact Number: No data recorded Contact Fax Number: No data recorded Prescriber Name: No data recorded Prescriber Address (if known): No data recorded  What Is the Reason for Your Visit/Call Today? Itching/Stinging sensations  How Long Has This Been Causing You Problems? <Week  What Do You Feel Would Help You the Most Today? -- (Relief from sensations)   Have You Recently Been in Any Inpatient Treatment (Hospital/Detox/Crisis Center/28-Day Program)? No  Name/Location of Program/Hospital:No data recorded How Long Were You There? No data recorded When Were You Discharged? No data recorded  Have You Ever Received Services From [facility] Before? No  Who Do You See at [facility]? No data recorded  Have You Recently Had Any Thoughts About Hurting Yourself? No  Are You Planning to Commit Suicide/Harm Yourself At This time? No   Have you Recently Had Thoughts About Hurting Someone Jennifer Vasquez? No  Explanation: No data recorded  Have You Used Any Alcohol or Drugs in the Past 24 Hours? No  How Long Ago Did You Use Drugs or Alcohol? No data recorded What Did You Use and How Much? No data recorded  Do You Currently Have a Therapist/Psychiatrist? Yes  Name of Therapist/Psychiatrist: [place]   Have You Been Recently Discharged From Any Office Practice or Programs? No  Explanation  of Discharge From Practice/Program: No data recorded    CCA Screening Triage Referral Assessment Type of Contact: Face-to-Face  Is this Initial or  Reassessment? No data recorded Date Telepsych consult ordered in [facility]:  No data recorded Time Telepsych consult ordered in [facility]:  No data recorded  Patient Reported Information Reviewed? Yes  Patient Left Without Being Seen? No data recorded Reason for Not Completing Assessment: No data recorded  Collateral Involvement: No data recorded  Does Patient Have a Court Appointed Legal Guardian? No data recorded Name and Contact of Legal Guardian: No data recorded If Minor and Not Living with Parent(s), Who has Custody? No data recorded Is CPS involved or ever been involved? Never  Is APS involved or ever been involved? Never   Patient Determined To Be At Risk for Harm To Self or Others Based on Review of Patient Reported Information or Presenting Complaint? No  Method: No data recorded Availability of Means: No data recorded Intent: No data recorded Notification Required: No data recorded Additional Information for Danger to Others Potential: No data recorded Additional Comments for Danger to Others Potential: No data recorded Are There Guns or Other Weapons in Your Home? No data recorded Types of Guns/Weapons: No data recorded Are These Weapons Safely Secured?                            No data recorded Who Could Verify You Are Able To Have These Secured: No data recorded Do You Have any Outstanding Charges, Pending Court Dates, Parole/Probation? No data recorded Contacted To Inform of Risk of Harm To Self or Others: No data recorded  Location of Assessment: [facility] ED   Does Patient Present under Involuntary Commitment? No  IVC Papers Initial File Date: No data recorded  [place] of Residence: [place]   Patient Currently Receiving the Following Services: Medication Management   Determination of Need: Emergent (2 hours)   Options For Referral: -- (Overnight assessment and reassessment in the AM.)     CCA Biopsychosocial Intake/Chief Complaint:  Itching/stinging  sensation  Current Symptoms/Problems: Itching/stinging sensation   Patient Reported Schizophrenia/Schizoaffective Diagnosis in Past: No   Strengths: The individual has supportive family  Preferences: None noted  Abilities: Asks for help   Type of Services Patient Feels are Needed: -- (N/A)   Initial Clinical Notes/Concerns: None noted   Mental Health Symptoms Depression:  No data recorded  Duration of Depressive symptoms: No data recorded  Mania:  Irritability; Change in energy/activity   Anxiety:   Worrying; Tension; Restlessness   Psychosis:  Hallucinations   Duration of Psychotic symptoms: Less than six months   Trauma:  None   Obsessions:  None   Compulsions:  None   Inattention:  None   Hyperactivity/Impulsivity:  N/A   Oppositional/Defiant Behaviors:  None   Emotional Irregularity:  None   Other Mood/Personality Symptoms:  No data recorded   Mental Status Exam Appearance and self-care  Stature:  Average   Weight:  Average weight   Clothing:  Disheveled   Grooming:  Neglected   Cosmetic use:  None   Posture/gait:  Normal   Motor activity:  Not Remarkable   Sensorium  Attention:  Normal   Concentration:  Normal   Orientation:  Time; Situation; Person; Object   Recall/memory:  Normal   Affect and Mood  Affect:  Full Range   Mood:  Dysphoric   Relating  Eye contact:  None   Facial expression:  Responsive   Attitude toward examiner:  Cooperative   Thought and Language  Speech flow: Slurred   Thought content:  Appropriate to Mood and Circumstances   Preoccupation:  None   Hallucinations:  None   Organization:  No data recorded  [employer] of Knowledge:  Average   Intelligence:  Average   Abstraction:  Normal   Judgement:  Impaired   Reality Testing:  Distorted   Insight:  Lacking   Decision Making:  Vacilates   Social Functioning  Social Maturity:  Responsible   Social Judgement:  Normal    Stress  Stressors:  Illness   Coping Ability:  Human resources officer Deficits:  None   Supports:  Family     Religion:    Leisure/Recreation:    Exercise/Diet: Exercise/Diet Do You Have Any Trouble Sleeping?: Yes Explanation of Sleeping Difficulties: Pt reports getting little sleep; only 2-3 hours at a time.   CCA Employment/Education Employment/Work Situation: Employment / Work Psychologist, occupational Employment situation:  Industrial/product designer) Has patient ever been in the [employer]?: No  Education: Education Is Patient Currently Attending School?: No   CCA Family/Childhood History Family and Relationship History: Family history Marital status: Single  Childhood History:     Child/Adolescent Assessment:     CCA Substance Use Alcohol/Drug Use: Alcohol / Drug Use Pain Medications: SEE MAR Prescriptions: SEE MAR Over the Counter: SEE MAR History of alcohol / drug use?: Yes Longest period of sobriety (when/how long): (P) pt. denies                         ASAM's:  Six Dimensions of Multidimensional Assessment  Dimension 1:  Acute Intoxication and/or Withdrawal Potential:      Dimension 2:  Biomedical Conditions and Complications:      Dimension 3:  Emotional, Behavioral, or Cognitive Conditions and Complications:     Dimension 4:  Readiness to Change:     Dimension 5:  Relapse, Continued use, or Continued Problem Potential:     Dimension 6:  Recovery/Living Environment:     ASAM Severity Score:    ASAM Recommended Level of Treatment:     Substance use Disorder (SUD)    Recommendations for Services/Supports/Treatments:    DSM5 Diagnoses: Patient Active Problem List   Diagnosis Date Noted  . 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  . Multifocal pneumonia 09/21/2019  . Sepsis due to pneumonia (HCC) 07/05/2018  . Community acquired pneumonia of left lung   . Respiratory failure (HCC) 11/05/2016  . DDD (degenerative disc disease), lumbar  11/13/2014  . DDD (degenerative disc disease), cervical 11/13/2014  . Lumbosacral radiculopathy 11/13/2014  . Acute carpal tunnel syndrome 10/10/2014  . Chronic pain 03/02/2013  . Depression 03/02/2013  . Bipolar disorder (HCC) 02/09/2013  . Hepatitis C 12/18/2012  . Asthma 11/23/2012  . Neck pain 11/23/2012  . Torticollis 11/23/2012  . Tobacco use disorder 09/06/2012  . Anxiety state 09/03/2009    Airrion Otting R Amarius Toto, LCAS

## 2020-11-09 NOTE — ED Provider Notes (Signed)
Emergency Medicine Observation Re-evaluation Note  Jennifer Vasquez is a 35 y.o. female, seen on rounds today.  Pt initially presented to the ED for complaints of Psychiatric Evaluation Currently, the patient is resting, voices no medical complaints.  Physical Exam  BP 110/61   Pulse 82   Temp 98.1 F (36.7 C) (Oral)   Resp 15   Ht 5\' 1"  (1.549 m)   Wt 92.1 kg   LMP  (LMP Unknown)   SpO2 94%   BMI 38.36 kg/m  Physical Exam General: Resting in no acute Cardiac: No cyanosis Lungs: Equal rise and fall Psych: Not agitated  ED Course / MDM  EKG:   I have reviewed the labs performed to date as well as medications administered while in observation.  Recent changes in the last 24 hours include no events over.  Plan  Current plan is for psychiatric reassessment this morning. Patient is not under full IVC at this time.   , MD 11/09/20 [phone]

## 2020-11-09 NOTE — Consult Note (Signed)
History of Present Illness   Patient Identification Jennifer Vasquez is a 35 y.o. female.  Patient information was obtained from patient. History/Exam limitations: none. Patient presented voluntarily to the Emergency Department by ambulance.    [facility] Face-to-Face Psychiatry Consult   Chief Complaint  Psychiatric Evaluation   Patient presents for psychiatric evaluation and requires medical clearance exam. Patient is brought by self. She is placed on Mental Health Hold and is not requiring physical restraint. Patient requires psychiatric evaluation with concern for out of control behavior and tactile hallucinations. She has associated symptoms including altered mental status and delusions.  Patient was not referred by a therapist or psychiatrist. Patient has a history of bipolar and poly substance abuse and UDS positive for opiods, benzos, and cocaine, for which she has been hospitalized and an extensive history with [facility].  Patient complains of delusions and psychotic behavior. Onset of symptoms was abrupt.  Symptoms are of moderate severity and are stable after an injection of ativan , haldol  and benadryl .  Symptoms are associated with drug or alcohol intoxication and past psychiatric history: substance abuse. History obtained from: chart review. Care prior to arrival consisted of nothing, with no relief.   Past Medical History:  Diagnosis Date  . Acute carpal tunnel syndrome 10/10/2014  . Anxiety state 09/03/2009  . Asthma   . Bipolar disorder (HCC) 02/09/2013  . CHF (congestive heart failure) (HCC)   . COPD (chronic obstructive pulmonary disease) (HCC)   . DDD (degenerative disc disease), cervical 11/13/2014  . DDD (degenerative disc disease), lumbar 11/13/2014  . Hepatitis C   . Lumbosacral radiculopathy 11/13/2014  . Tobacco use disorder 09/06/2012   Family History  Problem Relation Age of Onset  . Arthritis Mother   . Asthma Mother   . Diabetes Mother   . Hyperlipidemia Mother    . Vision loss Mother   . Alcohol abuse Father   . Asthma Father   . Diabetes Father   . Heart disease Father   . Depression Father   . COPD Father   . Cancer Father   . Hypertension Father   . Kidney disease Father   . Vision loss Father    Current Facility-Administered Medications  Medication Dose Route Frequency Provider Last Rate Last Admin  . levofloxacin (LEVAQUIN) tablet 750 mg  750 mg Oral Daily Gilles Chiquito, MD   750 mg at 11/08/20 1610   Current Outpatient Medications  Medication Sig Dispense Refill  . acetaminophen (TYLENOL) 500 MG tablet Take 500-1,000 mg by mouth every 6 (six) hours as needed for mild pain or moderate pain.    Marland Kitchen albuterol (PROVENTIL HFA;VENTOLIN HFA) 108 (90 Base) MCG/ACT inhaler Inhale 2 puffs into the lungs every 6 (six) hours as needed for wheezing or shortness of breath. 1 Inhaler 0  . albuterol (PROVENTIL) (2.5 MG/3ML) 0.083% nebulizer solution Take 2.5 mg by nebulization every 4 (four) hours as needed for wheezing or shortness of breath.    . budesonide-formoterol (SYMBICORT) 160-4.5 MCG/ACT inhaler Inhale 2 puffs into the lungs 2 (two) times daily.    . clonazePAM (KLONOPIN) 0.5 MG tablet Take 0.5 mg by mouth at bedtime as needed (sleep).     Marland Kitchen dextromethorphan-guaiFENesin (MUCINEX DM) 30-600 MG 12hr tablet Take 1 tablet by mouth 2 (two) times daily as needed for cough (congestion).     Marland Kitchen divalproex (DEPAKOTE ER) 500 MG 24 hr tablet Take 2,000 mg by mouth daily.    Marland Kitchen docusate sodium (COLACE) 100 MG  capsule Take 100 mg by mouth daily as needed for mild constipation.    . famotidine-calcium carbonate-magnesium hydroxide (PEPCID COMPLETE) 10-800-165 MG chewable tablet Chew 1 tablet by mouth 2 (two) times daily as needed (heartburn or stomach acid).    . ferrous sulfate 325 (65 FE) MG tablet Take 325 mg by mouth daily with breakfast.    . furosemide (LASIX) 20 MG tablet Take 20 mg by mouth daily.     . hydrOXYzine (ATARAX/VISTARIL) 25 MG tablet Take  50 mg by mouth every 6 (six) hours as needed for anxiety.    Marland Kitchen levothyroxine (SYNTHROID, LEVOTHROID) 25 MCG tablet Take 25 mcg by mouth daily before breakfast.    . lidocaine (LIDODERM) 5 % Place 1 patch onto the skin daily. Remove & Discard patch within 12 hours or as directed by MD 30 patch 0  . loratadine (CLARITIN) 10 MG tablet Take 10 mg by mouth daily.    . methotrexate (RHEUMATREX) 2.5 MG tablet Take 20 mg by mouth every Sunday.     . metoprolol succinate (TOPROL-XL) 25 MG 24 hr tablet Take 25 mg by mouth daily.    . naproxen sodium (ALEVE) 220 MG tablet Take 220-440 mg by mouth 2 (two) times daily as needed (pain).    . pregabalin (LYRICA) 25 MG capsule Take 25 mg by mouth 3 (three) times daily.    . propranolol (INDERAL) 10 MG tablet Take 10 mg by mouth 2 (two) times daily as needed for anxiety.    . sodium chloride (OCEAN) 0.65 % SOLN nasal spray Place 1 spray into both nostrils as needed for congestion.    . Tiotropium Bromide Monohydrate 2.5 MCG/ACT AERS Inhale 1 puff into the lungs daily.    Marland Kitchen tiZANidine (ZANAFLEX) 4 MG tablet Take 4 mg by mouth every 6 (six) hours as needed for muscle spasms.     . traMADol (ULTRAM) 50 MG tablet Take 50 mg by mouth at bedtime as needed for severe pain.     . traZODone (DESYREL) 100 MG tablet Take 100 mg by mouth at bedtime.     Allergies  Allergen Reactions  . Trazodone And Nefazodone     Pt says she was told it could stop her heart  . Acetaminophen     Other reaction(s): Other (qualifier value) Due to hep c  . Amoxicillin-Pot Clavulanate Diarrhea and Nausea And Vomiting  . Gabapentin Hives  . Tetanus Toxoids   . Tramadol Hives  . Shellfish Allergy Rash    oysters   Social History   Socioeconomic History  . Marital status: Single    Spouse name: Not on file  . Number of children: Not on file  . Years of education: Not on file  . Highest education level: Not on file  Occupational History  . Not on file  Tobacco Use  . Smoking  status: Current Some Day Smoker    Packs/day: 0.50    Types: Cigarettes  . Smokeless tobacco: Never Used  Vaping Use  . Vaping Use: Never used  Substance and Sexual Activity  . Alcohol use: Not Currently    Alcohol/week: 0.0 standard drinks  . Drug use: No  . Sexual activity: Not on file  Other Topics Concern  . Not on file  Social History Narrative  . Not on file   Social Determinants of Health   Financial Resource Strain: Not on file  Food Insecurity: Not on file  Transportation Needs: Not on file  Physical Activity: Not on  file  Stress: Not on file  Social Connections: Not on file  Intimate Partner Violence: Not on file   Review of Systems A comprehensive review of systems was negative.   Physical Exam   BP 110/61   Pulse 82   Temp 98.1 F (36.7 C) (Oral)   Resp 15   Ht  (1.549 m)   Wt 92.1 kg   LMP  (LMP Unknown)   SpO2 94%   BMI 38.36 kg/m    ED Course   Studies: Lab: Positive for cocaine, opiods, and benzos  Records Reviewed: Old medical records. Nursing notes.  Treatments: Antipsychotics given. Benadryl and ativan.  Disposition: Condition improved, Reassess in the AM.    Past Medical History:  Past Medical History:  Diagnosis Date  . Acute carpal tunnel syndrome 10/10/2014  . Anxiety state 09/03/2009  . Asthma   . Bipolar disorder (HCC) 02/09/2013  . CHF (congestive heart failure) (HCC)   . COPD (chronic obstructive pulmonary disease) (HCC)   . DDD (degenerative disc disease), cervical 11/13/2014  . DDD (degenerative disc disease), lumbar 11/13/2014  . Hepatitis C   . Lumbosacral radiculopathy 11/13/2014  . Tobacco use disorder 09/06/2012    Past Surgical History:  Procedure Laterality Date  . CERVICAL DISC ARTHROPLASTY    . TONSILLECTOMY    . TUBAL LIGATION     Family History:  Family History  Problem Relation Age of Onset  . Arthritis Mother   . Asthma Mother   . Diabetes Mother   . Hyperlipidemia Mother   . Vision loss Mother    . Alcohol abuse Father   . Asthma Father   . Diabetes Father   . Heart disease Father   . Depression Father   . COPD Father   . Cancer Father   . Hypertension Father   . Kidney disease Father   . Vision loss Father    Family Psychiatric  History: unknown Social History:  Social History   Substance and Sexual Activity  Alcohol Use Not Currently  . Alcohol/week: 0.0 standard drinks     Social History   Substance and Sexual Activity  Drug Use No    Social History   Socioeconomic History  . Marital status: Single    Spouse name: Not on file  . Number of children: Not on file  . Years of education: Not on file  . Highest education level: Not on file  Occupational History  . Not on file  Tobacco Use  . Smoking status: Current Some Day Smoker    Packs/day: 0.50    Types: Cigarettes  . Smokeless tobacco: Never Used  Vaping Use  . Vaping Use: Never used  Substance and Sexual Activity  . Alcohol use: Not Currently    Alcohol/week: 0.0 standard drinks  . Drug use: No  . Sexual activity: Not on file  Other Topics Concern  . Not on file  Social History Narrative  . Not on file   Social Determinants of Health   Financial Resource Strain: Not on file  Food Insecurity: Not on file  Transportation Needs: Not on file  Physical Activity: Not on file  Stress: Not on file  Social Connections: Not on file   Additional Social History:    Allergies:   Allergies  Allergen Reactions  . Trazodone And Nefazodone     Pt says she was told it could stop her heart  . Acetaminophen     Other reaction(s): Other (qualifier value) Due to  hep c  . Amoxicillin-Pot Clavulanate Diarrhea and Nausea And Vomiting  . Gabapentin Hives  . Tetanus Toxoids   . Tramadol Hives  . Shellfish Allergy Rash    oysters    Labs:  Results for orders placed or performed during the hospital encounter of 11/08/20 (from the past 48 hour(s))  Expectorated Sputum Assessment w Gram Stain, Rflx to  Resp Cult     Status: None   Collection Time: 11/08/20  7:28 AM   Specimen: Sputum  Result Value Ref Range   Specimen Description SPUTUM    Special Requests NONE    Sputum evaluation      Sputum specimen not acceptable for testing.  Please recollect.   C/GABBY HENRIQUEZ AT 1610 11/08/20.PMF Performed at [facility], [address] [place], [place] [address]    Report Status 11/08/2020 FINAL   Comprehensive metabolic panel     Status: Abnormal   Collection Time: 11/08/20  7:29 AM  Result Value Ref Range   Sodium 137 135 - 145 mmol/L   Potassium 3.5 3.5 - 5.1 mmol/L   Chloride 101 98 - 111 mmol/L   CO2 26 22 - 32 mmol/L   Glucose, Bld 131 (H) 70 - 99 mg/dL    Comment: Glucose reference range applies only to samples taken after fasting for at least 8 hours.   BUN 9 6 - 20 mg/dL   Creatinine, Ser 4.09 0.44 - 1.00 mg/dL   Calcium 9.0 8.9 - 81.1 mg/dL   Total Protein 7.5 6.5 - 8.1 g/dL   Albumin 4.3 3.5 - 5.0 g/dL   AST 22 15 - 41 U/L   ALT 17 0 - 44 U/L   Alkaline Phosphatase 70 38 - 126 U/L   Total Bilirubin 0.9 0.3 - 1.2 mg/dL   GFR, Estimated >91 >47 mL/min    Comment: (NOTE) Calculated using the CKD-EPI Creatinine Equation (2021)    Anion gap 10 5 - 15    Comment: Performed at [facility], [address] [place], [place] [address]  Ethanol     Status: None   Collection Time: 11/08/20  7:29 AM  Result Value Ref Range   Alcohol, Ethyl (B) <10 <10 mg/dL    Comment: (NOTE) Lowest detectable limit for serum alcohol is 10 mg/dL.  For medical purposes only. Performed at [facility], [address] [place], [place] [address]   Salicylate level     Status: Abnormal   Collection Time: 11/08/20  7:29 AM  Result Value Ref Range   Salicylate Lvl <7.0 (L) 7.0 - 30.0 mg/dL    Comment: Performed at [facility], [address] [place], [place] [address]  Acetaminophen level     Status: Abnormal   Collection Time: 11/08/20  7:29 AM   Result Value Ref Range   Acetaminophen (Tylenol), Serum <10 (L) 10 - 30 ug/mL    Comment: (NOTE) Therapeutic concentrations vary significantly. A range of 10-30 ug/mL  may be an effective concentration for many patients. However, some  are best treated at concentrations outside of this range. Acetaminophen concentrations >150 ug/mL at 4 hours after ingestion  and >50 ug/mL at 12 hours after ingestion are often associated with  toxic reactions.  Performed at [facility], [address] [place], [place] [address]   cbc     Status: Abnormal   Collection Time: 11/08/20  7:29 AM  Result Value Ref Range   WBC 14.1 (H) 4.0 - 10.5 K/uL  RBC 4.32 3.87 - 5.11 MIL/uL   Hemoglobin 12.7 12.0 - 15.0 g/dL   HCT 16.1 09.6 - 04.5 %   MCV 93.3 80.0 - 100.0 fL   MCH 29.4 26.0 - 34.0 pg   MCHC 31.5 30.0 - 36.0 g/dL   RDW 40.9 (H) 81.1 - 91.4 %   Platelets 152 150 - 400 K/uL   nRBC 0.0 0.0 - 0.2 %    Comment: Performed at [facility], [address] [place], [place] [address]  Procalcitonin - Baseline     Status: None   Collection Time: 11/08/20  7:29 AM  Result Value Ref Range   Procalcitonin <0.10 ng/mL    Comment:        Interpretation: PCT (Procalcitonin) <= 0.5 ng/mL: Systemic infection (sepsis) is not likely. Local bacterial infection is possible. (NOTE)       Sepsis PCT Algorithm           Lower Respiratory Tract                                      Infection PCT Algorithm    ----------------------------     ----------------------------         PCT < 0.25 ng/mL                PCT < 0.10 ng/mL          Strongly encourage             Strongly discourage   discontinuation of antibiotics    initiation of antibiotics    ----------------------------     -----------------------------       PCT 0.25 - 0.50 ng/mL            PCT 0.10 - 0.25 ng/mL               OR       >80% decrease in PCT            Discourage initiation of                                             antibiotics      Encourage discontinuation           of antibiotics    ----------------------------     -----------------------------         PCT >= 0.50 ng/mL              PCT 0.26 - 0.50 ng/mL               AND        <80% decrease in PCT             Encourage initiation of                                             antibiotics       Encourage continuation           of antibiotics    ----------------------------     -----------------------------        PCT >= 0.50 ng/mL  PCT > 0.50 ng/mL               AND         increase in PCT                  Strongly encourage                                      initiation of antibiotics    Strongly encourage escalation           of antibiotics                                     -----------------------------                                           PCT <= 0.25 ng/mL                                                 OR                                        > 80% decrease in PCT                                      Discontinue / Do not initiate                                             antibiotics  Performed at [facility], [address] [place], [place] [address]   CBC with Differential/Platelet     Status: Abnormal   Collection Time: 11/08/20  7:29 AM  Result Value Ref Range   WBC 14.5 (H) 4.0 - 10.5 K/uL   RBC 4.19 3.87 - 5.11 MIL/uL   Hemoglobin 12.4 12.0 - 15.0 g/dL   HCT 60.4 54.0 - 98.1 %   MCV 93.6 80.0 - 100.0 fL   MCH 29.6 26.0 - 34.0 pg   MCHC 31.6 30.0 - 36.0 g/dL   RDW 19.1 (H) 47.8 - 29.5 %   Platelets 201 150 - 400 K/uL   nRBC 0.0 0.0 - 0.2 %   Neutrophils Relative % 64 %   Neutro Abs 9.4 (H) 1.7 - 7.7 K/uL   Lymphocytes Relative 22 %   Lymphs Abs 3.2 0.7 - 4.0 K/uL   Monocytes Relative 12 %   Monocytes Absolute 1.7 (H) 0.1 - 1.0 K/uL   Eosinophils Relative 1 %   Eosinophils Absolute 0.1 0.0 - 0.5 K/uL   Basophils Relative 0 %   Basophils Absolute 0.1 0.0 - 0.1 K/uL   Immature  Granulocytes 1 %   Abs Immature Granulocytes 0.09 (H) 0.00 - 0.07 K/uL    Comment: Performed at [facility], [address], [place], [place]  [address]  Urine Drug Screen, Qualitative     Status: Abnormal   Collection Time: 11/08/20  7:37 AM  Result Value Ref Range   Tricyclic, Ur Screen POSITIVE (A) NONE DETECTED   Amphetamines, Ur Screen NONE DETECTED NONE DETECTED   MDMA (Ecstasy)Ur Screen NONE DETECTED NONE DETECTED   Cocaine Metabolite,Ur [place] POSITIVE (A) NONE DETECTED   Opiate, Ur Screen POSITIVE (A) NONE DETECTED   Phencyclidine (PCP) Ur S NONE DETECTED NONE DETECTED   Cannabinoid 50 Ng, Ur [place] NONE DETECTED NONE DETECTED   Barbiturates, Ur Screen NONE DETECTED NONE DETECTED   Benzodiazepine, Ur Scrn POSITIVE (A) NONE DETECTED   Methadone Scn, Ur NONE DETECTED NONE DETECTED    Comment: (NOTE) Tricyclics + metabolites, urine    Cutoff 1000 ng/mL Amphetamines + metabolites, urine  Cutoff 1000 ng/mL MDMA (Ecstasy), urine              Cutoff 500 ng/mL Cocaine Metabolite, urine          Cutoff 300 ng/mL Opiate + metabolites, urine        Cutoff 300 ng/mL Phencyclidine (PCP), urine         Cutoff 25 ng/mL Cannabinoid, urine                 Cutoff 50 ng/mL Barbiturates + metabolites, urine  Cutoff 200 ng/mL Benzodiazepine, urine              Cutoff 200 ng/mL Methadone, urine                   Cutoff 300 ng/mL  The urine drug screen provides only a preliminary, unconfirmed analytical test result and should not be used for non-medical purposes. Clinical consideration and professional judgment should be applied to any positive drug screen result due to possible interfering substances. A more specific alternate chemical method must be used in order to obtain a confirmed analytical result. Gas chromatography / mass spectrometry (GC/MS) is the preferred confirm atory method. Performed at [facility], [address] [place], [place] [address]   Resp Panel by  RT-PCR (Flu A&B, Covid) Nasopharyngeal Swab     Status: None   Collection Time: 11/08/20  7:37 AM   Specimen: Nasopharyngeal Swab; Nasopharyngeal(NP) swabs in vial transport medium  Result Value Ref Range   SARS Coronavirus 2 by RT PCR NEGATIVE NEGATIVE    Comment: (NOTE) SARS-CoV-2 target nucleic acids are NOT DETECTED.  The SARS-CoV-2 RNA is generally detectable in upper respiratory specimens during the acute phase of infection. The lowest concentration of SARS-CoV-2 viral copies this assay can detect is 138 copies/mL. A negative result does not preclude SARS-Cov-2 infection and should not be used as the sole basis for treatment or other patient management decisions. A negative result may occur with  improper specimen collection/handling, submission of specimen other than nasopharyngeal swab, presence of viral mutation(s) within the areas targeted by this assay, and inadequate number of viral copies(<138 copies/mL). A negative result must be combined with clinical observations, patient history, and epidemiological information. The expected result is Negative.  Fact Sheet for Patients:  BloggerCourse.com  Fact Sheet for Healthcare Providers:  SeriousBroker.it  This test is no t yet approved or cleared by the [place] FDA and  has been authorized for detection and/or diagnosis of SARS-CoV-2 by FDA under an Emergency Use Authorization (EUA). This EUA will remain  in effect (meaning this test can be used) for the duration of the COVID-19 declaration under Section 564(b)(1) of  the Act, 21 U.S.C.section 360bbb-3(b)(1), unless the authorization is terminated  or revoked sooner.       Influenza A by PCR NEGATIVE NEGATIVE   Influenza B by PCR NEGATIVE NEGATIVE    Comment: (NOTE) The Xpert Xpress SARS-CoV-2/FLU/RSV plus assay is intended as an aid in the diagnosis of influenza from Nasopharyngeal swab specimens and should not be  used as a sole basis for treatment. Nasal washings and aspirates are unacceptable for Xpert Xpress SARS-CoV-2/FLU/RSV testing.  Fact Sheet for Patients: BloggerCourse.com  Fact Sheet for Healthcare Providers: SeriousBroker.it  This test is not yet approved or cleared by the [place] FDA and has been authorized for detection and/or diagnosis of SARS-CoV-2 by FDA under an Emergency Use Authorization (EUA). This EUA will remain in effect (meaning this test can be used) for the duration of the COVID-19 declaration under Section 564(b)(1) of the Act, 21 U.S.C. section 360bbb-3(b)(1), unless the authorization is terminated or revoked.  Performed at [facility], [address] [place], [place] [address]   Valproic acid level     Status: Abnormal   Collection Time: 11/08/20 11:11 PM  Result Value Ref Range   Valproic Acid Lvl <10 (L) 50.0 - 100.0 ug/mL    Comment: Performed at [facility], [address] [place], [place] [address]    Current Facility-Administered Medications  Medication Dose Route Frequency Provider Last Rate Last Admin  . levofloxacin (LEVAQUIN) tablet 750 mg  750 mg Oral Daily Gilles Chiquito, MD   750 mg at 11/08/20 0981   Current Outpatient Medications  Medication Sig Dispense Refill  . acetaminophen (TYLENOL) 500 MG tablet Take 500-1,000 mg by mouth every 6 (six) hours as needed for mild pain or moderate pain.    Marland Kitchen albuterol (PROVENTIL HFA;VENTOLIN HFA) 108 (90 Base) MCG/ACT inhaler Inhale 2 puffs into the lungs every 6 (six) hours as needed for wheezing or shortness of breath. 1 Inhaler 0  . albuterol (PROVENTIL) (2.5 MG/3ML) 0.083% nebulizer solution Take 2.5 mg by nebulization every 4 (four) hours as needed for wheezing or shortness of breath.    . budesonide-formoterol (SYMBICORT) 160-4.5 MCG/ACT inhaler Inhale 2 puffs into the lungs 2 (two) times daily.    . clonazePAM (KLONOPIN)  0.5 MG tablet Take 0.5 mg by mouth at bedtime as needed (sleep).     Marland Kitchen dextromethorphan-guaiFENesin (MUCINEX DM) 30-600 MG 12hr tablet Take 1 tablet by mouth 2 (two) times daily as needed for cough (congestion).     Marland Kitchen divalproex (DEPAKOTE ER) 500 MG 24 hr tablet Take 2,000 mg by mouth daily.    Marland Kitchen docusate sodium (COLACE) 100 MG capsule Take 100 mg by mouth daily as needed for mild constipation.    . famotidine-calcium carbonate-magnesium hydroxide (PEPCID COMPLETE) 10-800-165 MG chewable tablet Chew 1 tablet by mouth 2 (two) times daily as needed (heartburn or stomach acid).    . ferrous sulfate 325 (65 FE) MG tablet Take 325 mg by mouth daily with breakfast.    . furosemide (LASIX) 20 MG tablet Take 20 mg by mouth daily.     . hydrOXYzine (ATARAX/VISTARIL) 25 MG tablet Take 50 mg by mouth every 6 (six) hours as needed for anxiety.    Marland Kitchen levothyroxine (SYNTHROID, LEVOTHROID) 25 MCG tablet Take 25 mcg by mouth daily before breakfast.    . lidocaine (LIDODERM) 5 % Place 1 patch onto the skin daily. Remove & Discard patch within 12 hours or as directed by MD 30 patch 0  . loratadine (  CLARITIN) 10 MG tablet Take 10 mg by mouth daily.    . methotrexate (RHEUMATREX) 2.5 MG tablet Take 20 mg by mouth every Sunday.     . metoprolol succinate (TOPROL-XL) 25 MG 24 hr tablet Take 25 mg by mouth daily.    . naproxen sodium (ALEVE) 220 MG tablet Take 220-440 mg by mouth 2 (two) times daily as needed (pain).    . pregabalin (LYRICA) 25 MG capsule Take 25 mg by mouth 3 (three) times daily.    . propranolol (INDERAL) 10 MG tablet Take 10 mg by mouth 2 (two) times daily as needed for anxiety.    . sodium chloride (OCEAN) 0.65 % SOLN nasal spray Place 1 spray into both nostrils as needed for congestion.    . Tiotropium Bromide Monohydrate 2.5 MCG/ACT AERS Inhale 1 puff into the lungs daily.    Marland Kitchen tiZANidine (ZANAFLEX) 4 MG tablet Take 4 mg by mouth every 6 (six) hours as needed for muscle spasms.     . traMADol  (ULTRAM) 50 MG tablet Take 50 mg by mouth at bedtime as needed for severe pain.     . traZODone (DESYREL) 100 MG tablet Take 100 mg by mouth at bedtime.      Musculoskeletal: Strength & Muscle Tone: within normal limits Gait & Station: normal Patient leans: N/A  Psychiatric Specialty Exam:  Presentation  General Appearance: Appropriate for Environment  Eye Contact:Fair  Speech:Clear and Coherent  Speech Volume:Normal  Handedness:Right   Mood and Affect  Mood:Euphoric  Affect:Appropriate   Thought Process  Thought Processes:Coherent  Descriptions of Associations:Intact  Orientation:Full (Time, Place and Person)  Thought Content:Illogical  History of Schizophrenia/Schizoaffective disorder:No  Duration of Psychotic Symptoms:Less than six months  Hallucinations:Hallucinations: None  Ideas of Reference:None  Suicidal Thoughts:Suicidal Thoughts: No  Homicidal Thoughts:Homicidal Thoughts: No   Sensorium  Memory:Immediate Fair  Judgment:Fair  Insight:Poor   Executive Functions  Concentration:Fair  Attention Span:Fair  Recall:Fair  Fund of Knowledge:Fair  Language:Fair   Psychomotor Activity  Psychomotor Activity:Psychomotor Activity: Normal   Assets  Assets:Desire for Improvement; Social Support   Sleep  Sleep:Sleep: Fair   Physical Exam: Physical Exam Vitals and nursing note reviewed.  Psychiatric:        Attention and Perception: Attention normal.        Mood and Affect: Mood normal.        Speech: Speech normal.        Behavior: Behavior is cooperative.        Thought Content: Thought content is delusional.        Cognition and Memory: Cognition is impaired.        Judgment: Judgment is inappropriate.    Review of Systems  Psychiatric/Behavioral: Positive for hallucinations and substance abuse.  All other systems reviewed and are negative.  Blood pressure 110/61, pulse 82, temperature 98.1 F (36.7 C), temperature source  Oral, resp. rate 15, height 5\' 1"  (1.549 m), weight 92.1 kg, SpO2 94 %. Body mass index is 38.36 kg/m.  Treatment Plan Summary: Reassess in the am.  Giving Substances a chance to metabolized   Disposition: Reassess in the AM  , NP 11/09/2020 2:43 AM

## 2020-11-09 NOTE — ED Notes (Signed)
Pt asking for an inahler. EDP made aware.

## 2020-11-09 NOTE — Consult Note (Incomplete)
[facility] Face-to-Face Psychiatry Consult   Reason for Consult:  Psychiatric consult Referring Physician:  Dr. Ninfa Meeker Patient Identification: Jennifer Vasquez MRN:  [id number] Principal Diagnosis: Psychoactive substance-induced psychosis (HCC) Diagnosis:  Principal Problem:   Psychoactive substance-induced psychosis (HCC)   Total Time spent with patient: 1 hour  Subjective:   Jennifer Vasquez is a 35 y.o. female patient admitted  from home complaining of coughing up worms. Pt states she feel like worms are coming out of her skin.  Pt also endorses bilateral leg pain. Pt with fever yesterday states she took tylenol temp in triage 99.2.   HPI:   History of Present Illness   Patient Identification Jennifer Vasquez is a 35 y.o. female.  Patient information was obtained from patient. History/Exam limitations: none. Patient presented voluntarily to the Emergency Department by ambulance.    Chief Complaint  Psychiatric Evaluation   Patient presents for psychiatric evaluation and requires medical clearance exam. She is placed on Mental Health Hold and is not requiring physical restraint. Patient requires psychiatric evaluation with concern for substance abuse and tactile hallucinations. She has no associated symptoms. Patient was not referred by a therapist or psychiatrist. Patient has a history of bipolar and polysubstance abuse, for which she has been hospitalized before.   Patient complains of delusions and psychotic behavior. Onset of symptoms was abrupt.  Symptoms are of moderate severity and are stable at the time of assessment.  Patient states symptoms have been exacerbated by {situational context:60315}. Symptoms are associated with {associated symptoms:60318}. {cohort history:60376}. Care prior to arrival consisted of {home care:60200}, with {relief:12621} relief.   Past Medical History:  Diagnosis Date  . Acute carpal tunnel syndrome 10/10/2014  . Anxiety state 09/03/2009  . Asthma    . Bipolar disorder (HCC) 02/09/2013  . CHF (congestive heart failure) (HCC)   . COPD (chronic obstructive pulmonary disease) (HCC)   . DDD (degenerative disc disease), cervical 11/13/2014  . DDD (degenerative disc disease), lumbar 11/13/2014  . Hepatitis C   . Lumbosacral radiculopathy 11/13/2014  . Tobacco use disorder 09/06/2012   Family History  Problem Relation Age of Onset  . Arthritis Mother   . Asthma Mother   . Diabetes Mother   . Hyperlipidemia Mother   . Vision loss Mother   . Alcohol abuse Father   . Asthma Father   . Diabetes Father   . Heart disease Father   . Depression Father   . COPD Father   . Cancer Father   . Hypertension Father   . Kidney disease Father   . Vision loss Father    Current Facility-Administered Medications  Medication Dose Route Frequency Provider Last Rate Last Admin  . levofloxacin (LEVAQUIN) tablet 750 mg  750 mg Oral Daily Gilles Chiquito, MD   750 mg at 11/08/20 8416   Current Outpatient Medications  Medication Sig Dispense Refill  . acetaminophen (TYLENOL) 500 MG tablet Take 500-1,000 mg by mouth every 6 (six) hours as needed for mild pain or moderate pain.    Marland Kitchen albuterol (PROVENTIL HFA;VENTOLIN HFA) 108 (90 Base) MCG/ACT inhaler Inhale 2 puffs into the lungs every 6 (six) hours as needed for wheezing or shortness of breath. 1 Inhaler 0  . albuterol (PROVENTIL) (2.5 MG/3ML) 0.083% nebulizer solution Take 2.5 mg by nebulization every 4 (four) hours as needed for wheezing or shortness of breath.    . budesonide-formoterol (SYMBICORT) 160-4.5 MCG/ACT inhaler Inhale 2 puffs into the lungs 2 (two) times daily.    Marland Kitchen  clonazePAM (KLONOPIN) 0.5 MG tablet Take 0.5 mg by mouth at bedtime as needed (sleep).     Marland Kitchen dextromethorphan-guaiFENesin (MUCINEX DM) 30-600 MG 12hr tablet Take 1 tablet by mouth 2 (two) times daily as needed for cough (congestion).     Marland Kitchen divalproex (DEPAKOTE ER) 500 MG 24 hr tablet Take 2,000 mg by mouth daily.    Marland Kitchen docusate sodium  (COLACE) 100 MG capsule Take 100 mg by mouth daily as needed for mild constipation.    . famotidine-calcium carbonate-magnesium hydroxide (PEPCID COMPLETE) 10-800-165 MG chewable tablet Chew 1 tablet by mouth 2 (two) times daily as needed (heartburn or stomach acid).    . ferrous sulfate 325 (65 FE) MG tablet Take 325 mg by mouth daily with breakfast.    . furosemide (LASIX) 20 MG tablet Take 20 mg by mouth daily.     . hydrOXYzine (ATARAX/VISTARIL) 25 MG tablet Take 50 mg by mouth every 6 (six) hours as needed for anxiety.    Marland Kitchen levothyroxine (SYNTHROID, LEVOTHROID) 25 MCG tablet Take 25 mcg by mouth daily before breakfast.    . lidocaine (LIDODERM) 5 % Place 1 patch onto the skin daily. Remove & Discard patch within 12 hours or as directed by MD 30 patch 0  . loratadine (CLARITIN) 10 MG tablet Take 10 mg by mouth daily.    . methotrexate (RHEUMATREX) 2.5 MG tablet Take 20 mg by mouth every Sunday.     . metoprolol succinate (TOPROL-XL) 25 MG 24 hr tablet Take 25 mg by mouth daily.    . naproxen sodium (ALEVE) 220 MG tablet Take 220-440 mg by mouth 2 (two) times daily as needed (pain).    . pregabalin (LYRICA) 25 MG capsule Take 25 mg by mouth 3 (three) times daily.    . propranolol (INDERAL) 10 MG tablet Take 10 mg by mouth 2 (two) times daily as needed for anxiety.    . sodium chloride (OCEAN) 0.65 % SOLN nasal spray Place 1 spray into both nostrils as needed for congestion.    . Tiotropium Bromide Monohydrate 2.5 MCG/ACT AERS Inhale 1 puff into the lungs daily.    Marland Kitchen tiZANidine (ZANAFLEX) 4 MG tablet Take 4 mg by mouth every 6 (six) hours as needed for muscle spasms.     . traMADol (ULTRAM) 50 MG tablet Take 50 mg by mouth at bedtime as needed for severe pain.     . traZODone (DESYREL) 100 MG tablet Take 100 mg by mouth at bedtime.     Allergies  Allergen Reactions  . Trazodone And Nefazodone     Pt says she was told it could stop her heart  . Acetaminophen     Other reaction(s): Other  (qualifier value) Due to hep c  . Amoxicillin-Pot Clavulanate Diarrhea and Nausea And Vomiting  . Gabapentin Hives  . Tetanus Toxoids   . Tramadol Hives  . Shellfish Allergy Rash    oysters   Social History   Socioeconomic History  . Marital status: Single    Spouse name: Not on file  . Number of children: Not on file  . Years of education: Not on file  . Highest education level: Not on file  Occupational History  . Not on file  Tobacco Use  . Smoking status: Current Some Day Smoker    Packs/day: 0.50    Types: Cigarettes  . Smokeless tobacco: Never Used  Vaping Use  . Vaping Use: Never used  Substance and Sexual Activity  . Alcohol use: Not  Currently    Alcohol/week: 0.0 standard drinks  . Drug use: No  . Sexual activity: Not on file  Other Topics Concern  . Not on file  Social History Narrative  . Not on file   Social Determinants of Health   Financial Resource Strain: Not on file  Food Insecurity: Not on file  Transportation Needs: Not on file  Physical Activity: Not on file  Stress: Not on file  Social Connections: Not on file  Intimate Partner Violence: Not on file   Review of Systems {ROS - complete:30496}   Physical Exam   BP 110/61   Pulse 82   Temp 98.1 F (36.7 C) (Oral)   Resp 15   Ht 5\' 1"  (1.549 m)   Wt 92.1 kg   LMP  (LMP Unknown)   SpO2 94%   BMI 38.36 kg/m  {Exam, Complete:17964}  ED Course   Studies: {Studies:60337}  Records Reviewed: {Reviewed:60251}  Treatments: {ED Psych Tx list:18167}  Consultations: {Consultations:60250}  Disposition: {ED Disposition:60811}  Past Psychiatric History: ***  Risk to Self:   Risk to Others:   Prior Inpatient Therapy:   Prior Outpatient Therapy:    Past Medical History:  Past Medical History:  Diagnosis Date  . Acute carpal tunnel syndrome 10/10/2014  . Anxiety state 09/03/2009  . Asthma   . Bipolar disorder (HCC) 02/09/2013  . CHF (congestive heart failure) (HCC)   . COPD  (chronic obstructive pulmonary disease) (HCC)   . DDD (degenerative disc disease), cervical 11/13/2014  . DDD (degenerative disc disease), lumbar 11/13/2014  . Hepatitis C   . Lumbosacral radiculopathy 11/13/2014  . Tobacco use disorder 09/06/2012    Past Surgical History:  Procedure Laterality Date  . CERVICAL DISC ARTHROPLASTY    . TONSILLECTOMY    . TUBAL LIGATION     Family History:  Family History  Problem Relation Age of Onset  . Arthritis Mother   . Asthma Mother   . Diabetes Mother   . Hyperlipidemia Mother   . Vision loss Mother   . Alcohol abuse Father   . Asthma Father   . Diabetes Father   . Heart disease Father   . Depression Father   . COPD Father   . Cancer Father   . Hypertension Father   . Kidney disease Father   . Vision loss Father    Family Psychiatric  History: *** Social History:  Social History   Substance and Sexual Activity  Alcohol Use Not Currently  . Alcohol/week: 0.0 standard drinks     Social History   Substance and Sexual Activity  Drug Use No    Social History   Socioeconomic History  . Marital status: Single    Spouse name: Not on file  . Number of children: Not on file  . Years of education: Not on file  . Highest education level: Not on file  Occupational History  . Not on file  Tobacco Use  . Smoking status: Current Some Day Smoker    Packs/day: 0.50    Types: Cigarettes  . Smokeless tobacco: Never Used  Vaping Use  . Vaping Use: Never used  Substance and Sexual Activity  . Alcohol use: Not Currently    Alcohol/week: 0.0 standard drinks  . Drug use: No  . Sexual activity: Not on file  Other Topics Concern  . Not on file  Social History Narrative  . Not on file   Social Determinants of Health   Financial Resource Strain: Not on  file  Food Insecurity: Not on file  Transportation Needs: Not on file  Physical Activity: Not on file  Stress: Not on file  Social Connections: Not on file   Additional Social  History:    Allergies:   Allergies  Allergen Reactions  . Trazodone And Nefazodone     Pt says she was told it could stop her heart  . Acetaminophen     Other reaction(s): Other (qualifier value) Due to hep c  . Amoxicillin-Pot Clavulanate Diarrhea and Nausea And Vomiting  . Gabapentin Hives  . Tetanus Toxoids   . Tramadol Hives  . Shellfish Allergy Rash    oysters    Labs:  Results for orders placed or performed during the hospital encounter of 11/08/20 (from the past 48 hour(s))  Expectorated Sputum Assessment w Gram Stain, Rflx to Resp Cult     Status: None   Collection Time: 11/08/20  7:28 AM   Specimen: Sputum  Result Value Ref Range   Specimen Description SPUTUM    Special Requests NONE    Sputum evaluation      Sputum specimen not acceptable for testing.  Please recollect.   C/GABBY HENRIQUEZ AT 1610 11/08/20.PMF Performed at [facility], [address] [place], [place] [address]    Report Status 11/08/2020 FINAL   Comprehensive metabolic panel     Status: Abnormal   Collection Time: 11/08/20  7:29 AM  Result Value Ref Range   Sodium 137 135 - 145 mmol/L   Potassium 3.5 3.5 - 5.1 mmol/L   Chloride 101 98 - 111 mmol/L   CO2 26 22 - 32 mmol/L   Glucose, Bld 131 (H) 70 - 99 mg/dL    Comment: Glucose reference range applies only to samples taken after fasting for at least 8 hours.   BUN 9 6 - 20 mg/dL   Creatinine, Ser 4.09 0.44 - 1.00 mg/dL   Calcium 9.0 8.9 - 81.1 mg/dL   Total Protein 7.5 6.5 - 8.1 g/dL   Albumin 4.3 3.5 - 5.0 g/dL   AST 22 15 - 41 U/L   ALT 17 0 - 44 U/L   Alkaline Phosphatase 70 38 - 126 U/L   Total Bilirubin 0.9 0.3 - 1.2 mg/dL   GFR, Estimated >91 >47 mL/min    Comment: (NOTE) Calculated using the CKD-EPI Creatinine Equation (2021)    Anion gap 10 5 - 15    Comment: Performed at [facility], [address] [place], [place] [address]  Ethanol     Status: None   Collection Time: 11/08/20  7:29 AM  Result Value  Ref Range   Alcohol, Ethyl (B) <10 <10 mg/dL    Comment: (NOTE) Lowest detectable limit for serum alcohol is 10 mg/dL.  For medical purposes only. Performed at [facility], [address] [place], [place] [address]   Salicylate level     Status: Abnormal   Collection Time: 11/08/20  7:29 AM  Result Value Ref Range   Salicylate Lvl <7.0 (L) 7.0 - 30.0 mg/dL    Comment: Performed at [facility], [address] [place], [place] [address]  Acetaminophen level     Status: Abnormal   Collection Time: 11/08/20  7:29 AM  Result Value Ref Range   Acetaminophen (Tylenol), Serum <10 (L) 10 - 30 ug/mL    Comment: (NOTE) Therapeutic concentrations vary significantly. A range of 10-30 ug/mL  may be an effective concentration for many patients. However, some  are best  treated at concentrations outside of this range. Acetaminophen concentrations >150 ug/mL at 4 hours after ingestion  and >50 ug/mL at 12 hours after ingestion are often associated with  toxic reactions.  Performed at [facility], [address] [place], [place] [address]   cbc     Status: Abnormal   Collection Time: 11/08/20  7:29 AM  Result Value Ref Range   WBC 14.1 (H) 4.0 - 10.5 K/uL   RBC 4.32 3.87 - 5.11 MIL/uL   Hemoglobin 12.7 12.0 - 15.0 g/dL   HCT 84.1 66.0 - 63.0 %   MCV 93.3 80.0 - 100.0 fL   MCH 29.4 26.0 - 34.0 pg   MCHC 31.5 30.0 - 36.0 g/dL   RDW 16.0 (H) 10.9 - 32.3 %   Platelets 152 150 - 400 K/uL   nRBC 0.0 0.0 - 0.2 %    Comment: Performed at [facility], [address] [place], [place] [address]  Procalcitonin - Baseline     Status: None   Collection Time: 11/08/20  7:29 AM  Result Value Ref Range   Procalcitonin <0.10 ng/mL    Comment:        Interpretation: PCT (Procalcitonin) <= 0.5 ng/mL: Systemic infection (sepsis) is not likely. Local bacterial infection is possible. (NOTE)       Sepsis PCT Algorithm           Lower Respiratory Tract                                       Infection PCT Algorithm    ----------------------------     ----------------------------         PCT < 0.25 ng/mL                PCT < 0.10 ng/mL          Strongly encourage             Strongly discourage   discontinuation of antibiotics    initiation of antibiotics    ----------------------------     -----------------------------       PCT 0.25 - 0.50 ng/mL            PCT 0.10 - 0.25 ng/mL               OR       >80% decrease in PCT            Discourage initiation of                                            antibiotics      Encourage discontinuation           of antibiotics    ----------------------------     -----------------------------         PCT >= 0.50 ng/mL              PCT 0.26 - 0.50 ng/mL               AND        <80% decrease in PCT             Encourage initiation of  antibiotics       Encourage continuation           of antibiotics    ----------------------------     -----------------------------        PCT >= 0.50 ng/mL                  PCT > 0.50 ng/mL               AND         increase in PCT                  Strongly encourage                                      initiation of antibiotics    Strongly encourage escalation           of antibiotics                                     -----------------------------                                           PCT <= 0.25 ng/mL                                                 OR                                        > 80% decrease in PCT                                      Discontinue / Do not initiate                                             antibiotics  Performed at [facility], [address] [place], [place] [address]   CBC with Differential/Platelet     Status: Abnormal   Collection Time: 11/08/20  7:29 AM  Result Value Ref Range   WBC 14.5 (H) 4.0 - 10.5 K/uL   RBC 4.19 3.87 - 5.11 MIL/uL   Hemoglobin 12.4 12.0 - 15.0  g/dL   HCT 91.4 78.2 - 95.6 %   MCV 93.6 80.0 - 100.0 fL   MCH 29.6 26.0 - 34.0 pg   MCHC 31.6 30.0 - 36.0 g/dL   RDW 21.3 (H) 08.6 - 57.8 %   Platelets 201 150 - 400 K/uL   nRBC 0.0 0.0 - 0.2 %   Neutrophils Relative % 64 %   Neutro Abs 9.4 (H) 1.7 - 7.7 K/uL   Lymphocytes Relative 22 %   Lymphs Abs 3.2 0.7 - 4.0 K/uL   Monocytes Relative 12 %   Monocytes Absolute 1.7 (H) 0.1 - 1.0 K/uL   Eosinophils Relative  1 %   Eosinophils Absolute 0.1 0.0 - 0.5 K/uL   Basophils Relative 0 %   Basophils Absolute 0.1 0.0 - 0.1 K/uL   Immature Granulocytes 1 %   Abs Immature Granulocytes 0.09 (H) 0.00 - 0.07 K/uL    Comment: Performed at [facility], [address], [place], [place] [address]  Urine Drug Screen, Qualitative     Status: Abnormal   Collection Time: 11/08/20  7:37 AM  Result Value Ref Range   Tricyclic, Ur Screen POSITIVE (A) NONE DETECTED   Amphetamines, Ur Screen NONE DETECTED NONE DETECTED   MDMA (Ecstasy)Ur Screen NONE DETECTED NONE DETECTED   Cocaine Metabolite,Ur [place] POSITIVE (A) NONE DETECTED   Opiate, Ur Screen POSITIVE (A) NONE DETECTED   Phencyclidine (PCP) Ur S NONE DETECTED NONE DETECTED   Cannabinoid 50 Ng, Ur [place] NONE DETECTED NONE DETECTED   Barbiturates, Ur Screen NONE DETECTED NONE DETECTED   Benzodiazepine, Ur Scrn POSITIVE (A) NONE DETECTED   Methadone Scn, Ur NONE DETECTED NONE DETECTED    Comment: (NOTE) Tricyclics + metabolites, urine    Cutoff 1000 ng/mL Amphetamines + metabolites, urine  Cutoff 1000 ng/mL MDMA (Ecstasy), urine              Cutoff 500 ng/mL Cocaine Metabolite, urine          Cutoff 300 ng/mL Opiate + metabolites, urine        Cutoff 300 ng/mL Phencyclidine (PCP), urine         Cutoff 25 ng/mL Cannabinoid, urine                 Cutoff 50 ng/mL Barbiturates + metabolites, urine  Cutoff 200 ng/mL Benzodiazepine, urine              Cutoff 200 ng/mL Methadone, urine                   Cutoff 300 ng/mL  The urine drug screen  provides only a preliminary, unconfirmed analytical test result and should not be used for non-medical purposes. Clinical consideration and professional judgment should be applied to any positive drug screen result due to possible interfering substances. A more specific alternate chemical method must be used in order to obtain a confirmed analytical result. Gas chromatography / mass spectrometry (GC/MS) is the preferred confirm atory method. Performed at [facility], [address] [place], [place] [address]   Resp Panel by RT-PCR (Flu A&B, Covid) Nasopharyngeal Swab     Status: None   Collection Time: 11/08/20  7:37 AM   Specimen: Nasopharyngeal Swab; Nasopharyngeal(NP) swabs in vial transport medium  Result Value Ref Range   SARS Coronavirus 2 by RT PCR NEGATIVE NEGATIVE    Comment: (NOTE) SARS-CoV-2 target nucleic acids are NOT DETECTED.  The SARS-CoV-2 RNA is generally detectable in upper respiratory specimens during the acute phase of infection. The lowest concentration of SARS-CoV-2 viral copies this assay can detect is 138 copies/mL. A negative result does not preclude SARS-Cov-2 infection and should not be used as the sole basis for treatment or other patient management decisions. A negative result may occur with  improper specimen collection/handling, submission of specimen other than nasopharyngeal swab, presence of viral mutation(s) within the areas targeted by this assay, and inadequate number of viral copies(<138 copies/mL). A negative result must be combined with clinical observations, patient history, and epidemiological information. The expected result is Negative.  Fact Sheet for Patients:  BloggerCourse.com  Fact Sheet for Healthcare Providers:  SeriousBroker.it  This test is no t yet approved or cleared by the [place] and  has been authorized for detection and/or diagnosis of SARS-CoV-2  by FDA under an Emergency Use Authorization (EUA). This EUA will remain  in effect (meaning this test can be used) for the duration of the COVID-19 declaration under Section 564(b)(1) of the Act, 21 U.S.C.section 360bbb-3(b)(1), unless the authorization is terminated  or revoked sooner.       Influenza A by PCR NEGATIVE NEGATIVE   Influenza B by PCR NEGATIVE NEGATIVE    Comment: (NOTE) The Xpert Xpress SARS-CoV-2/FLU/RSV plus assay is intended as an aid in the diagnosis of influenza from Nasopharyngeal swab specimens and should not be used as a sole basis for treatment. Nasal washings and aspirates are unacceptable for Xpert Xpress SARS-CoV-2/FLU/RSV testing.  Fact Sheet for Patients: BloggerCourse.com  Fact Sheet for Healthcare Providers: SeriousBroker.it  This test is not yet approved or cleared by the [place] FDA and has been authorized for detection and/or diagnosis of SARS-CoV-2 by FDA under an Emergency Use Authorization (EUA). This EUA will remain in effect (meaning this test can be used) for the duration of the COVID-19 declaration under Section 564(b)(1) of the Act, 21 U.S.C. section 360bbb-3(b)(1), unless the authorization is terminated or revoked.  Performed at [facility], [address] [place], [place] [address]   Valproic acid level     Status: Abnormal   Collection Time: 11/08/20 11:11 PM  Result Value Ref Range   Valproic Acid Lvl <10 (L) 50.0 - 100.0 ug/mL    Comment: Performed at [facility], [address] [place], [place] [address]    Current Facility-Administered Medications  Medication Dose Route Frequency Provider Last Rate Last Admin  . levofloxacin (LEVAQUIN) tablet 750 mg  750 mg Oral Daily Gilles Chiquito, MD   750 mg at 11/08/20 8295   Current Outpatient Medications  Medication Sig Dispense Refill  . acetaminophen (TYLENOL) 500 MG tablet Take 500-1,000 mg by  mouth every 6 (six) hours as needed for mild pain or moderate pain.    Marland Kitchen albuterol (PROVENTIL HFA;VENTOLIN HFA) 108 (90 Base) MCG/ACT inhaler Inhale 2 puffs into the lungs every 6 (six) hours as needed for wheezing or shortness of breath. 1 Inhaler 0  . albuterol (PROVENTIL) (2.5 MG/3ML) 0.083% nebulizer solution Take 2.5 mg by nebulization every 4 (four) hours as needed for wheezing or shortness of breath.    . budesonide-formoterol (SYMBICORT) 160-4.5 MCG/ACT inhaler Inhale 2 puffs into the lungs 2 (two) times daily.    . clonazePAM (KLONOPIN) 0.5 MG tablet Take 0.5 mg by mouth at bedtime as needed (sleep).     Marland Kitchen dextromethorphan-guaiFENesin (MUCINEX DM) 30-600 MG 12hr tablet Take 1 tablet by mouth 2 (two) times daily as needed for cough (congestion).     Marland Kitchen divalproex (DEPAKOTE ER) 500 MG 24 hr tablet Take 2,000 mg by mouth daily.    Marland Kitchen docusate sodium (COLACE) 100 MG capsule Take 100 mg by mouth daily as needed for mild constipation.    . famotidine-calcium carbonate-magnesium hydroxide (PEPCID COMPLETE) 10-800-165 MG chewable tablet Chew 1 tablet by mouth 2 (two) times daily as needed (heartburn or stomach acid).    . ferrous sulfate 325 (65 FE) MG tablet Take 325 mg by mouth daily with breakfast.    . furosemide (LASIX) 20 MG tablet Take 20 mg by mouth daily.     . hydrOXYzine (ATARAX/VISTARIL) 25 MG tablet Take 50 mg by mouth every 6 (  six) hours as needed for anxiety.    Marland Kitchen levothyroxine (SYNTHROID, LEVOTHROID) 25 MCG tablet Take 25 mcg by mouth daily before breakfast.    . lidocaine (LIDODERM) 5 % Place 1 patch onto the skin daily. Remove & Discard patch within 12 hours or as directed by MD 30 patch 0  . loratadine (CLARITIN) 10 MG tablet Take 10 mg by mouth daily.    . methotrexate (RHEUMATREX) 2.5 MG tablet Take 20 mg by mouth every Sunday.     . metoprolol succinate (TOPROL-XL) 25 MG 24 hr tablet Take 25 mg by mouth daily.    . naproxen sodium (ALEVE) 220 MG tablet Take 220-440 mg by mouth 2  (two) times daily as needed (pain).    . pregabalin (LYRICA) 25 MG capsule Take 25 mg by mouth 3 (three) times daily.    . propranolol (INDERAL) 10 MG tablet Take 10 mg by mouth 2 (two) times daily as needed for anxiety.    . sodium chloride (OCEAN) 0.65 % SOLN nasal spray Place 1 spray into both nostrils as needed for congestion.    . Tiotropium Bromide Monohydrate 2.5 MCG/ACT AERS Inhale 1 puff into the lungs daily.    Marland Kitchen tiZANidine (ZANAFLEX) 4 MG tablet Take 4 mg by mouth every 6 (six) hours as needed for muscle spasms.     . traMADol (ULTRAM) 50 MG tablet Take 50 mg by mouth at bedtime as needed for severe pain.     . traZODone (DESYREL) 100 MG tablet Take 100 mg by mouth at bedtime.      Musculoskeletal: Strength & Muscle Tone: {desc; muscle tone:32375} Gait & Station: {PE GAIT ED PPJK:93267} Patient leans: {Patient Leans:21022755}            Psychiatric Specialty Exam:  Presentation  General Appearance: Appropriate for Environment  Eye Contact:Fair  Speech:Clear and Coherent  Speech Volume:Normal  Handedness:Right   Mood and Affect  Mood:Euphoric  Affect:Appropriate   Thought Process  Thought Processes:Coherent  Descriptions of Associations:Intact  Orientation:Full (Time, Place and Person)  Thought Content:Illogical  History of Schizophrenia/Schizoaffective disorder:No data recorded Duration of Psychotic Symptoms:No data recorded Hallucinations:Hallucinations: None  Ideas of Reference:None  Suicidal Thoughts:Suicidal Thoughts: No  Homicidal Thoughts:Homicidal Thoughts: No   Sensorium  Memory:Immediate Fair  Judgment:Fair  Insight:Poor   Executive Functions  Concentration:Fair  Attention Span:Fair  Recall:Fair  Fund of Knowledge:Fair  Language:Fair   Psychomotor Activity  Psychomotor Activity:Psychomotor Activity: Normal   Assets  Assets:Desire for Improvement; Social Support   Sleep  Sleep:Sleep: Fair   Physical  Exam: Physical Exam ROS Blood pressure 110/61, pulse 82, temperature 98.1 F (36.7 C), temperature source Oral, resp. rate 15, height 5\' 1"  (1.549 m), weight 92.1 kg, SpO2 94 %. Body mass index is 38.36 kg/m.  Treatment Plan Summary: {CHL [facility] MD [place] [facility]  Disposition: {CHL [facility] Consult [facility]  [id number]}, NP 11/09/2020 2:01 AM

## 2020-11-09 NOTE — ED Notes (Signed)
Food tray was given with juice. 

## 2020-11-09 NOTE — ED Notes (Signed)
Pt asking when she will be able to go home.  RN explained the NP will be here this evening to reassess her and determine if she is safe for discharge. Pt expressed understanding.

## 2020-11-09 NOTE — ED Notes (Signed)
Pt given 2nd pillow.

## 2020-11-09 NOTE — ED Notes (Signed)
Patient moved to BHU5, oriented to unit with 15 minute rounding and videos camera in rooms.

## 2020-11-09 NOTE — ED Notes (Signed)
VS not taken, patient asleep 

## 2020-11-09 NOTE — ED Notes (Signed)
Pt spoke with her father on the phone.

## 2020-11-09 NOTE — ED Notes (Signed)
Pt given lunch tray.

## 2020-11-09 NOTE — ED Notes (Signed)
VOL,  pend reassessment

## 2020-11-10 MED ORDER — LEVOFLOXACIN 750 MG PO TABS: 750.0000 mg | ORAL_TABLET | Freq: Every day | ORAL | 0 refills | Status: AC

## 2020-11-10 NOTE — ED Notes (Signed)
Father called to check on patient. This RN gave patient phone to talk to father. Patient requesting not to speak on phone right now. Father updated on patient request. Patient father verbalized understanding.

## 2020-11-10 NOTE — ED Provider Notes (Signed)
The patient has been evaluated at bedside by Dr. Toni Amend, psychiatry.  Patient is clinically stable.  Not felt to be a danger to self or others.  No SI or Hi.  No indication for inpatient psychiatric admission at this time.  Appropriate for continued outpatient therapy.    Willy Eddy, MD 11/10/20 [phone]

## 2020-11-10 NOTE — ED Notes (Signed)
Pt refusing vitals at this time. Will attempt again. Will continue to monitor.

## 2020-11-10 NOTE — ED Provider Notes (Signed)
Emergency Medicine Observation Re-evaluation Note  Jennifer Vasquez is a 35 y.o. female, seen on rounds today.  Pt initially presented to the ED for complaints of Psychiatric Evaluation Currently, the patient is resting in NAD.  Physical Exam  BP 107/62   Pulse 78   Temp 97.8 F (36.6 C) (Oral)   Resp 18   Ht 5\' 1"  (1.549 m)   Wt 92.1 kg   LMP  (LMP Unknown)   SpO2 95%   BMI 38.36 kg/m  Physical Exam General: NAD Cardiac: well perfused Lungs: even and unlabored Psych: currently calm  ED Course / MDM  EKG:   I have reviewed the labs performed to date as well as medications administered while in observation.  Recent changes in the last 24 hours include none.  Plan  Current plan is for psych re-eval and dispo. Patient is not under full IVC at this time.   , MD 11/10/20 [phone]

## 2020-11-10 NOTE — Consult Note (Signed)
[facility] Face-to-Face Psychiatry Consult   Reason for Consult: Follow-up consult for this 35 year old woman who came to the emergency room initially with physical complaints but became agitated and confused making statements that appeared to be psychotic and so was referred for psychiatric treatment Referring Physician: Roxan Hockey Patient Identification: Jennifer Vasquez MRN:  [id number] Principal Diagnosis: Psychoactive substance-induced psychosis (HCC) Diagnosis:  Principal Problem:   Psychoactive substance-induced psychosis (HCC)   Total Time spent with patient: 45 minutes  Subjective:   Jennifer Vasquez is a 35 y.o. female patient admitted with "I honestly do not remember".  HPI: Patient seen chart reviewed.  35 year old woman with a history of mood problems and substance abuse problems.  Presented with physical complaints but then appeared to be potentially psychotic complaining of worms coming out of her body also agitated at one point.  On interview today the patient is sleepy but arousable.  Slow and sluggish but communicative.  Says she does not remember what brought her into the hospital.  Does not remember seeing any worms crawling out of her.  Remembers feeling that she had worms inside of her but says she no longer has that feeling.  Patient denies that she has been misusing or abusing any medication or using any illegal drugs recently.  Drug screen would say otherwise being positive for cocaine as well as benzodiazepines neither of which she is prescribed.  She is also not prescribed anything that should be showing up on the opiate screen.  Patient at this point is calm lucid denies hallucinations denies suicidal ideation.  Has outpatient treatment she follows up with at [facility]  Past Psychiatric History: Multiple prior evaluations in the emergency room for a remarkable number of pneumonias.  No one seems to have put it together that these may be from oversedation that she is getting recurrent  aspiration pneumonia.  Her old psychiatric notes at [facility] are under lock and key but it does look like substance abuse has been identified in the past.  Bipolar 2 diagnosis has also been suggested.  Unclear if she has had inpatient hospitalizations.  Risk to Self:   Risk to Others:   Prior Inpatient Therapy:   Prior Outpatient Therapy:    Past Medical History:  Past Medical History:  Diagnosis Date  . Acute carpal tunnel syndrome 10/10/2014  . Anxiety state 09/03/2009  . Asthma   . Bipolar disorder (HCC) 02/09/2013  . CHF (congestive heart failure) (HCC)   . COPD (chronic obstructive pulmonary disease) (HCC)   . DDD (degenerative disc disease), cervical 11/13/2014  . DDD (degenerative disc disease), lumbar 11/13/2014  . Hepatitis C   . Lumbosacral radiculopathy 11/13/2014  . Tobacco use disorder 09/06/2012    Past Surgical History:  Procedure Laterality Date  . CERVICAL DISC ARTHROPLASTY    . TONSILLECTOMY    . TUBAL LIGATION     Family History:  Family History  Problem Relation Age of Onset  . Arthritis Mother   . Asthma Mother   . Diabetes Mother   . Hyperlipidemia Mother   . Vision loss Mother   . Alcohol abuse Father   . Asthma Father   . Diabetes Father   . Heart disease Father   . Depression Father   . COPD Father   . Cancer Father   . Hypertension Father   . Kidney disease Father   . Vision loss Father    Family Psychiatric  History: None reported Social History:  Social History   Substance and  Sexual Activity  Alcohol Use Not Currently  . Alcohol/week: 0.0 standard drinks     Social History   Substance and Sexual Activity  Drug Use No    Social History   Socioeconomic History  . Marital status: Single    Spouse name: Not on file  . Number of children: Not on file  . Years of education: Not on file  . Highest education level: Not on file  Occupational History  . Not on file  Tobacco Use  . Smoking status: Current Some Day Smoker    Packs/day: 0.50     Types: Cigarettes  . Smokeless tobacco: Never Used  Vaping Use  . Vaping Use: Never used  Substance and Sexual Activity  . Alcohol use: Not Currently    Alcohol/week: 0.0 standard drinks  . Drug use: No  . Sexual activity: Not on file  Other Topics Concern  . Not on file  Social History Narrative  . Not on file   Social Determinants of Health   Financial Resource Strain: Not on file  Food Insecurity: Not on file  Transportation Needs: Not on file  Physical Activity: Not on file  Stress: Not on file  Social Connections: Not on file   Additional Social History:    Allergies:   Allergies  Allergen Reactions  . Trazodone And Nefazodone     Pt says she was told it could stop her heart  . Acetaminophen     Other reaction(s): Other (qualifier value) Due to hep c  . Amoxicillin-Pot Clavulanate Diarrhea and Nausea And Vomiting  . Gabapentin Hives  . Tetanus Toxoids   . Tramadol Hives  . Shellfish Allergy Rash    oysters    Labs:  Results for orders placed or performed during the hospital encounter of 11/08/20 (from the past 48 hour(s))  Valproic acid level     Status: Abnormal   Collection Time: 11/08/20 11:11 PM  Result Value Ref Range   Valproic Acid Lvl <10 (L) 50.0 - 100.0 ug/mL    Comment: Performed at [facility], [address] [place], [place] [address]  HIV Antibody (routine testing w rflx)     Status: None   Collection Time: 11/08/20 11:11 PM  Result Value Ref Range   HIV Screen 4th Generation wRfx Non Reactive Non Reactive    Comment: Performed at [facility], [address] [place], [place] [address]    Current Facility-Administered Medications  Medication Dose Route Frequency Provider Last Rate Last Admin  . albuterol (VENTOLIN HFA) 108 (90 Base) MCG/ACT inhaler 2 puff  2 puff Inhalation Q6H PRN Jene Every, MD      . levofloxacin [facility]) tablet 750 mg  750 mg Oral Daily Gilles Chiquito, MD   750 mg at 11/09/20 0951  .  mometasone-formoterol (DULERA) 200-5 MCG/ACT inhaler 2 puff  2 puff Inhalation BID Jene Every, MD       Current Outpatient Medications  Medication Sig Dispense Refill  . acetaminophen (TYLENOL) 500 MG tablet Take 500-1,000 mg by mouth every 6 (six) hours as needed for mild pain or moderate pain.    Marland Kitchen albuterol (PROVENTIL HFA;VENTOLIN HFA) 108 (90 Base) MCG/ACT inhaler Inhale 2 puffs into the lungs every 6 (six) hours as needed for wheezing or shortness of breath. 1 Inhaler 0  . cyclobenzaprine (FLEXERIL) 10 MG tablet Take 10 mg by mouth every 8 (eight) hours as needed for muscle spasms.    . diphenhydrAMINE (BENADRYL) 25 mg capsule  Take 25 mg by mouth daily.    Marland Kitchen docusate sodium (COLACE) 100 MG capsule Take 200 mg by mouth daily.    Marland Kitchen enoxaparin (LOVENOX) 100 MG/ML injection Inject 100 mg into the skin every 12 (twelve) hours.    . famotidine (PEPCID) 20 MG tablet Take 20 mg by mouth 2 (two) times daily.    . famotidine-calcium carbonate-magnesium hydroxide (PEPCID COMPLETE) 10-800-165 MG chewable tablet Chew 1 tablet by mouth 2 (two) times daily as needed (heartburn or stomach acid).    . fluticasone (FLONASE) 50 MCG/ACT nasal spray Place 1 spray into both nostrils daily.    . fluticasone-salmeterol (WIXELA INHUB) 500-50 MCG/ACT AEPB Inhale 1 puff into the lungs daily.    . folic acid (FOLVITE) 1 MG tablet Take 1 mg by mouth daily.    . furosemide (LASIX) 20 MG tablet Take 20 mg by mouth daily.     . methotrexate (RHEUMATREX) 2.5 MG tablet Take 20 mg by mouth once a week.    . metoprolol succinate (TOPROL-XL) 25 MG 24 hr tablet Take 12.5 mg by mouth daily.    . OXYGEN Inhale 2 L into the lungs daily. continuous    . predniSONE (DELTASONE) 5 MG tablet Take 7.5 mg by mouth daily with breakfast.    . propranolol (INDERAL) 10 MG tablet Take 10 mg by mouth 2 (two) times daily as needed for anxiety.    . sodium chloride (OCEAN) 0.65 % SOLN nasal spray Place 1 spray into both nostrils as needed  for congestion.    . Tiotropium Bromide Monohydrate 2.5 MCG/ACT AERS Inhale 1 puff into the lungs daily.    . traZODone (DESYREL) 100 MG tablet Take 100 mg by mouth at bedtime.    Marland Kitchen albuterol (PROVENTIL) (2.5 MG/3ML) 0.083% nebulizer solution Take 2.5 mg by nebulization every 4 (four) hours as needed for wheezing or shortness of breath. (Patient not taking: Reported on 11/09/2020)    . budesonide-formoterol (SYMBICORT) 160-4.5 MCG/ACT inhaler Inhale 2 puffs into the lungs 2 (two) times daily.    . clonazePAM (KLONOPIN) 0.5 MG tablet Take 0.5 mg by mouth at bedtime as needed (sleep).  (Patient not taking: Reported on 11/09/2020)    . dextromethorphan-guaiFENesin (MUCINEX DM) 30-600 MG 12hr tablet Take 1 tablet by mouth 2 (two) times daily as needed for cough (congestion).  (Patient not taking: Reported on 11/09/2020)    . divalproex (DEPAKOTE ER) 500 MG 24 hr tablet Take 2,000 mg by mouth daily.    . ferrous sulfate 325 (65 FE) MG tablet Take 325 mg by mouth daily with breakfast. (Patient not taking: Reported on 11/09/2020)    . hydrOXYzine (ATARAX/VISTARIL) 25 MG tablet Take 50 mg by mouth every 6 (six) hours as needed for anxiety. (Patient not taking: Reported on 11/09/2020)    . levothyroxine (SYNTHROID, LEVOTHROID) 25 MCG tablet Take 25 mcg by mouth daily before breakfast. (Patient not taking: Reported on 11/09/2020)    . lidocaine (LIDODERM) 5 % Place 1 patch onto the skin daily. Remove & Discard patch within 12 hours or as directed by MD (Patient not taking: Reported on 11/09/2020) 30 patch 0  . loratadine (CLARITIN) 10 MG tablet Take 10 mg by mouth daily. (Patient not taking: Reported on 11/09/2020)    . naproxen sodium (ALEVE) 220 MG tablet Take 220-440 mg by mouth 2 (two) times daily as needed (pain). (Patient not taking: Reported on 11/09/2020)    . pregabalin (LYRICA) 25 MG capsule Take 25 mg by mouth 3 (  three) times daily. (Patient not taking: Reported on 11/09/2020)    . tiZANidine (ZANAFLEX) 4 MG tablet Take  4 mg by mouth every 6 (six) hours as needed for muscle spasms.  (Patient not taking: Reported on 11/09/2020)    . traMADol (ULTRAM) 50 MG tablet Take 50 mg by mouth at bedtime as needed for severe pain.  (Patient not taking: Reported on 11/09/2020)      Musculoskeletal: Strength & Muscle Tone: within normal limits Gait & Station: normal Patient leans: N/A            Psychiatric Specialty Exam:  Presentation  General Appearance: Appropriate for Environment  Eye Contact:Fair  Speech:Clear and Coherent  Speech Volume:Normal  Handedness:Right   Mood and Affect  Mood:Euphoric  Affect:Appropriate   Thought Process  Thought Processes:Coherent  Descriptions of Associations:Intact  Orientation:Full (Time, Place and Person)  Thought Content:Illogical  History of Schizophrenia/Schizoaffective disorder:No  Duration of Psychotic Symptoms:Less than six months  Hallucinations:Hallucinations: None  Ideas of Reference:None  Suicidal Thoughts:Suicidal Thoughts: No  Homicidal Thoughts:Homicidal Thoughts: No   Sensorium  Memory:Immediate Fair  Judgment:Fair  Insight:Poor   Executive Functions  Concentration:Fair  Attention Span:Fair  Recall:Fair  Fund of Knowledge:Fair  Language:Fair   Psychomotor Activity  Psychomotor Activity:Psychomotor Activity: Normal   Assets  Assets:Desire for Improvement; Social Support   Sleep  Sleep:Sleep: Fair   Physical Exam: Physical Exam Vitals and nursing note reviewed.  Constitutional:      Appearance: Normal appearance.  HENT:     Head: Normocephalic and atraumatic.     Mouth/Throat:     Pharynx: Oropharynx is clear.  Eyes:     Pupils: Pupils are equal, round, and reactive to light.  Cardiovascular:     Rate and Rhythm: Normal rate and regular rhythm.  Pulmonary:     Effort: Pulmonary effort is normal.     Breath sounds: Normal breath sounds.  Abdominal:     General: Abdomen is flat.      Palpations: Abdomen is soft.  Musculoskeletal:        General: Normal range of motion.  Skin:    General: Skin is warm and dry.  Neurological:     General: No focal deficit present.     Mental Status: She is alert. Mental status is at baseline.  Psychiatric:        Attention and Perception: She is inattentive.        Mood and Affect: Mood normal.        Speech: Speech is delayed.        Behavior: Behavior is slowed.        Thought Content: Thought content normal.        Cognition and Memory: She exhibits impaired recent memory.    Review of Systems  Constitutional: Negative.   HENT: Negative.   Eyes: Negative.   Respiratory: Negative.   Cardiovascular: Negative.   Gastrointestinal: Negative.   Musculoskeletal: Negative.   Skin: Negative.   Neurological: Negative.   Psychiatric/Behavioral: Positive for memory loss. Negative for depression, hallucinations, substance abuse and suicidal ideas. The patient is not nervous/anxious.    Blood pressure 107/62, pulse 78, temperature 97.8 F (36.6 C), temperature source Oral, resp. rate 18, height  (1.549 m), weight 92.1 kg, SpO2 95 %. Body mass index is 38.36 kg/m.  Treatment Plan Summary: Plan Patient at this point no longer meets any commitment criteria.  She is not psychotic not agitated completely denies suicidal or homicidal ideation.  Lucid.  Not  showing insight into her substance abuse problem.  Has a stable place to live and outpatient treatment in place.  Psychoeducation and encouragement to her to refrain from its use of medications.  At this point however does not require inpatient treatment and will be discharged with follow-up at [facility].  Disposition: No evidence of imminent risk to self or others at present.   Patient does not meet criteria for psychiatric inpatient admission. Supportive therapy provided about ongoing stressors.  Mordecai Rasmussen, MD 11/10/2020 11:49 AM

## 2020-11-10 NOTE — ED Notes (Signed)
VOL/Re-Assesment  recommended

## 2020-11-10 NOTE — ED Notes (Signed)
Pt given belongings bag 1/1 at time of discharge. Cab called for patient by this RN. [employer] informed this RN that they would be here in 45 minutes. Pt wishes to wait outside for cab to get here. E-signature not working at this time. Pt verbalized understanding of D/C instructions, prescriptions and follow up care with no further questions at this time. Pt in NAD and ambulatory at time of D/C.

## 2021-08-21 ENCOUNTER — Encounter: Payer: Self-pay | Admitting: Emergency Medicine

## 2021-08-21 ENCOUNTER — Other Ambulatory Visit: Payer: Self-pay

## 2021-08-21 ENCOUNTER — Emergency Department
Admission: EM | Admit: 2021-08-21 | Discharge: 2021-08-21 | Disposition: A | Discharge status: Home / Self Care | Payer: Self-pay | Attending: Emergency Medicine | Admitting: Emergency Medicine

## 2021-08-21 DIAGNOSIS — T300 Burn of unspecified body region, unspecified degree: Secondary | ICD-10-CM

## 2021-08-21 DIAGNOSIS — I509 Heart failure, unspecified: Secondary | ICD-10-CM | POA: Insufficient documentation

## 2021-08-21 DIAGNOSIS — X088XXA Exposure to other specified smoke, fire and flames, initial encounter: Secondary | ICD-10-CM | POA: Insufficient documentation

## 2021-08-21 DIAGNOSIS — T2016XA Burn of first degree of forehead and cheek, initial encounter: Secondary | ICD-10-CM | POA: Insufficient documentation

## 2021-08-21 DIAGNOSIS — J449 Chronic obstructive pulmonary disease, unspecified: Secondary | ICD-10-CM | POA: Insufficient documentation

## 2021-08-21 DIAGNOSIS — Y92009 Unspecified place in unspecified non-institutional (private) residence as the place of occurrence of the external cause: Secondary | ICD-10-CM | POA: Insufficient documentation

## 2021-08-21 MED ORDER — SILVER SULFADIAZINE 1 % EX CREA
TOPICAL_CREAM | Freq: Once | CUTANEOUS | Status: AC
  Filled 2021-08-21: qty 85

## 2021-08-21 MED ORDER — OXYCODONE-ACETAMINOPHEN 5-325 MG PO TABS
1.0000 | ORAL_TABLET | Freq: Once | ORAL | Status: AC
  Administered 2021-08-21: 1 via ORAL
  Filled 2021-08-21: qty 1

## 2021-08-21 MED ORDER — BACITRACIN ZINC 500 UNIT/GM EX OINT: TOPICAL_OINTMENT | Freq: Two times a day (BID) | CUTANEOUS | Status: DC

## 2021-08-21 MED ORDER — HYDROCODONE-ACETAMINOPHEN 5-325 MG PO TABS: 1.0000 | ORAL_TABLET | ORAL | 0 refills | Status: DC | PRN
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
## 2022-02-07 ENCOUNTER — Inpatient Hospital Stay
Admission: EM | Admit: 2022-02-07 | Discharge: 2022-02-09 | DRG: 190 | Discharge status: Against Medical Advice | Payer: Self-pay | Attending: Internal Medicine | Admitting: Internal Medicine

## 2022-02-07 ENCOUNTER — Emergency Department: Payer: Self-pay

## 2022-02-07 DIAGNOSIS — Z821 Family history of blindness and visual loss: Secondary | ICD-10-CM

## 2022-02-07 DIAGNOSIS — Z885 Allergy status to narcotic agent status: Secondary | ICD-10-CM

## 2022-02-07 DIAGNOSIS — Z5329 Procedure and treatment not carried out because of patient's decision for other reasons: Secondary | ICD-10-CM | POA: Diagnosis present

## 2022-02-07 DIAGNOSIS — Z825 Family history of asthma and other chronic lower respiratory diseases: Secondary | ICD-10-CM

## 2022-02-07 DIAGNOSIS — Z8349 Family history of other endocrine, nutritional and metabolic diseases: Secondary | ICD-10-CM

## 2022-02-07 DIAGNOSIS — Z20822 Contact with and (suspected) exposure to covid-19: Secondary | ICD-10-CM | POA: Diagnosis present

## 2022-02-07 DIAGNOSIS — Z7969 Long term (current) use of other immunomodulators and immunosuppressants: Secondary | ICD-10-CM

## 2022-02-07 DIAGNOSIS — I493 Ventricular premature depolarization: Secondary | ICD-10-CM | POA: Diagnosis present

## 2022-02-07 DIAGNOSIS — F17210 Nicotine dependence, cigarettes, uncomplicated: Secondary | ICD-10-CM | POA: Diagnosis present

## 2022-02-07 DIAGNOSIS — Z8261 Family history of arthritis: Secondary | ICD-10-CM

## 2022-02-07 DIAGNOSIS — J441 Chronic obstructive pulmonary disease with (acute) exacerbation: Principal | ICD-10-CM | POA: Diagnosis present

## 2022-02-07 DIAGNOSIS — F1193 Opioid use, unspecified with withdrawal: Secondary | ICD-10-CM

## 2022-02-07 DIAGNOSIS — Z79899 Other long term (current) drug therapy: Secondary | ICD-10-CM

## 2022-02-07 DIAGNOSIS — Z91013 Allergy to seafood: Secondary | ICD-10-CM

## 2022-02-07 DIAGNOSIS — Z818 Family history of other mental and behavioral disorders: Secondary | ICD-10-CM

## 2022-02-07 DIAGNOSIS — Z886 Allergy status to analgesic agent status: Secondary | ICD-10-CM

## 2022-02-07 DIAGNOSIS — F319 Bipolar disorder, unspecified: Secondary | ICD-10-CM

## 2022-02-07 DIAGNOSIS — Z811 Family history of alcohol abuse and dependence: Secondary | ICD-10-CM

## 2022-02-07 DIAGNOSIS — Z887 Allergy status to serum and vaccine status: Secondary | ICD-10-CM

## 2022-02-07 DIAGNOSIS — Z79890 Hormone replacement therapy (postmenopausal): Secondary | ICD-10-CM

## 2022-02-07 DIAGNOSIS — Z809 Family history of malignant neoplasm, unspecified: Secondary | ICD-10-CM

## 2022-02-07 DIAGNOSIS — Z8249 Family history of ischemic heart disease and other diseases of the circulatory system: Secondary | ICD-10-CM

## 2022-02-07 DIAGNOSIS — F1910 Other psychoactive substance abuse, uncomplicated: Secondary | ICD-10-CM

## 2022-02-07 DIAGNOSIS — Z880 Allergy status to penicillin: Secondary | ICD-10-CM

## 2022-02-07 DIAGNOSIS — J9601 Acute respiratory failure with hypoxia: Secondary | ICD-10-CM

## 2022-02-07 DIAGNOSIS — F1123 Opioid dependence with withdrawal: Secondary | ICD-10-CM | POA: Diagnosis present

## 2022-02-07 DIAGNOSIS — Z888 Allergy status to other drugs, medicaments and biological substances status: Secondary | ICD-10-CM

## 2022-02-07 DIAGNOSIS — Z7951 Long term (current) use of inhaled steroids: Secondary | ICD-10-CM

## 2022-02-07 DIAGNOSIS — Z841 Family history of disorders of kidney and ureter: Secondary | ICD-10-CM

## 2022-02-07 DIAGNOSIS — Z833 Family history of diabetes mellitus: Secondary | ICD-10-CM

## 2022-02-07 DIAGNOSIS — Z981 Arthrodesis status: Secondary | ICD-10-CM

## 2022-02-07 LAB — CBG MONITORING, ED: Glucose-Capillary: 108 mg/dL — ABNORMAL HIGH (ref 70–99)

## 2022-02-07 MED ORDER — LORAZEPAM 2 MG/ML IJ SOLN
1.0000 mg | Freq: Once | INTRAMUSCULAR | Status: AC
  Administered 2022-02-08: 1 mg via INTRAVENOUS
  Filled 2022-02-07: qty 1

## 2022-02-07 MED ORDER — SODIUM CHLORIDE 0.9 % IV BOLUS (SEPSIS)
1000.0000 mL | Freq: Once | INTRAVENOUS | Status: AC
  Administered 2022-02-08: 1000 mL via INTRAVENOUS

## 2022-02-07 MED ORDER — IPRATROPIUM-ALBUTEROL 0.5-2.5 (3) MG/3ML IN SOLN
3.0000 mL | RESPIRATORY_TRACT | Status: AC
  Administered 2022-02-08 (×2): 3 mL via RESPIRATORY_TRACT
  Filled 2022-02-07: qty 9

## 2022-02-07 NOTE — ED Triage Notes (Signed)
Patient BIB EMS , patient family called after per family " had an asthma attack ". Patient altered. Eyes are closed .Eyes open to name . Airway is clear , respirations are even and non labored. Patient given 2 duo nebs , 125 of solumedrol and 2mg  of narcan  given . 2 Ivs inserted by EMS. Patient is now awake speaking with her family . Placed on nasal canula  2 L saturating at 96%.

## 2022-02-08 ENCOUNTER — Other Ambulatory Visit: Payer: Self-pay

## 2022-02-08 DIAGNOSIS — F1910 Other psychoactive substance abuse, uncomplicated: Secondary | ICD-10-CM

## 2022-02-08 DIAGNOSIS — F319 Bipolar disorder, unspecified: Secondary | ICD-10-CM

## 2022-02-08 DIAGNOSIS — F1193 Opioid use, unspecified with withdrawal: Secondary | ICD-10-CM

## 2022-02-08 DIAGNOSIS — J9601 Acute respiratory failure with hypoxia: Secondary | ICD-10-CM

## 2022-02-08 DIAGNOSIS — J441 Chronic obstructive pulmonary disease with (acute) exacerbation: Principal | ICD-10-CM | POA: Diagnosis present

## 2022-02-08 DIAGNOSIS — G43909 Migraine, unspecified, not intractable, without status migrainosus: Secondary | ICD-10-CM | POA: Insufficient documentation

## 2022-02-08 LAB — CBC WITH DIFFERENTIAL/PLATELET
Abs Immature Granulocytes: 0.06 10*3/uL (ref 0.00–0.07)
Basophils Absolute: 0 10*3/uL (ref 0.0–0.1)
Basophils Relative: 0 %
Eosinophils Absolute: 0 10*3/uL (ref 0.0–0.5)
Eosinophils Relative: 0 %
HCT: 41.1 % (ref 36.0–46.0)
Hemoglobin: 13 g/dL (ref 12.0–15.0)
Immature Granulocytes: 1 %
Lymphocytes Relative: 15 %
Lymphs Abs: 1.7 10*3/uL (ref 0.7–4.0)
MCH: 28.8 pg (ref 26.0–34.0)
MCHC: 31.6 g/dL (ref 30.0–36.0)
MCV: 91.1 fL (ref 80.0–100.0)
Monocytes Absolute: 0.9 10*3/uL (ref 0.1–1.0)
Monocytes Relative: 8 %
Neutro Abs: 8.6 10*3/uL — ABNORMAL HIGH (ref 1.7–7.7)
Neutrophils Relative %: 76 %
Platelets: 280 10*3/uL (ref 150–400)
RBC: 4.51 MIL/uL (ref 3.87–5.11)
RDW: 15.6 % — ABNORMAL HIGH (ref 11.5–15.5)
WBC: 11.3 10*3/uL — ABNORMAL HIGH (ref 4.0–10.5)
nRBC: 0 % (ref 0.0–0.2)

## 2022-02-08 LAB — BLOOD GAS, VENOUS
Acid-Base Excess: 8.7 mmol/L — ABNORMAL HIGH (ref 0.0–2.0)
Bicarbonate: 36.1 mmol/L — ABNORMAL HIGH (ref 20.0–28.0)
O2 Saturation: 56.5 %
Patient temperature: 37
pCO2, Ven: 61 mmHg — ABNORMAL HIGH (ref 44–60)
pH, Ven: 7.38 (ref 7.25–7.43)
pO2, Ven: 33 mmHg (ref 32–45)

## 2022-02-08 LAB — URINE DRUG SCREEN, QUALITATIVE (ARMC ONLY)
Amphetamines, Ur Screen: NOT DETECTED
Barbiturates, Ur Screen: NOT DETECTED
Benzodiazepine, Ur Scrn: NOT DETECTED
Cannabinoid 50 Ng, Ur ~~LOC~~: NOT DETECTED
Cocaine Metabolite,Ur ~~LOC~~: POSITIVE — AB
MDMA (Ecstasy)Ur Screen: NOT DETECTED
Methadone Scn, Ur: NOT DETECTED
Opiate, Ur Screen: NOT DETECTED
Phencyclidine (PCP) Ur S: NOT DETECTED
Tricyclic, Ur Screen: POSITIVE — AB

## 2022-02-08 LAB — BASIC METABOLIC PANEL
Anion gap: 10 (ref 5–15)
Anion gap: 7 (ref 5–15)
BUN: 10 mg/dL (ref 6–20)
BUN: 9 mg/dL (ref 6–20)
CO2: 28 mmol/L (ref 22–32)
CO2: 30 mmol/L (ref 22–32)
Calcium: 8.8 mg/dL — ABNORMAL LOW (ref 8.9–10.3)
Calcium: 9 mg/dL (ref 8.9–10.3)
Chloride: 105 mmol/L (ref 98–111)
Chloride: 106 mmol/L (ref 98–111)
Creatinine, Ser: 0.56 mg/dL (ref 0.44–1.00)
Creatinine, Ser: 0.68 mg/dL (ref 0.44–1.00)
GFR, Estimated: >60 mL/min (ref >60)
GFR, Estimated: >60 mL/min (ref >60)
Glucose, Bld: 111 mg/dL — ABNORMAL HIGH (ref 70–99)
Glucose, Bld: 163 mg/dL — ABNORMAL HIGH (ref 70–99)
Potassium: 3.5 mmol/L (ref 3.5–5.1)
Potassium: 3.6 mmol/L (ref 3.5–5.1)
Sodium: 143 mmol/L (ref 135–145)
Sodium: 143 mmol/L (ref 135–145)

## 2022-02-08 LAB — CBC
HCT: 43.6 % (ref 36.0–46.0)
Hemoglobin: 13.4 g/dL (ref 12.0–15.0)
MCH: 28.5 pg (ref 26.0–34.0)
MCHC: 30.7 g/dL (ref 30.0–36.0)
MCV: 92.8 fL (ref 80.0–100.0)
Platelets: 252 10*3/uL (ref 150–400)
RBC: 4.7 MIL/uL (ref 3.87–5.11)
RDW: 15.4 % (ref 11.5–15.5)
WBC: 9.7 10*3/uL (ref 4.0–10.5)
nRBC: 0 % (ref 0.0–0.2)

## 2022-02-08 LAB — TROPONIN I (HIGH SENSITIVITY)
Troponin I (High Sensitivity): 4 ng/L (ref <18)
Troponin I (High Sensitivity): 4 ng/L (ref <18)

## 2022-02-08 LAB — BRAIN NATRIURETIC PEPTIDE: B Natriuretic Peptide: 47.4 pg/mL (ref 0.0–100.0)

## 2022-02-08 LAB — ETHANOL: Alcohol, Ethyl (B): <10 mg/dL (ref <10)

## 2022-02-08 LAB — HCG, QUANTITATIVE, PREGNANCY: hCG, Beta Chain, Quant, S: 1 m[IU]/mL (ref <5)

## 2022-02-08 LAB — SARS CORONAVIRUS 2 BY RT PCR: SARS Coronavirus 2 by RT PCR: NEGATIVE

## 2022-02-08 LAB — HIV ANTIBODY (ROUTINE TESTING W REFLEX): HIV Screen 4th Generation wRfx: NONREACTIVE

## 2022-02-08 MED ORDER — ONDANSETRON HCL 4 MG/2ML IJ SOLN: 4.0000 mg | Freq: Four times a day (QID) | INTRAMUSCULAR | Status: DC | PRN

## 2022-02-08 MED ORDER — IPRATROPIUM-ALBUTEROL 0.5-2.5 (3) MG/3ML IN SOLN
3.0000 mL | Freq: Once | RESPIRATORY_TRACT | Status: AC
  Administered 2022-02-08: 3 mL via RESPIRATORY_TRACT
  Filled 2022-02-08: qty 3

## 2022-02-08 MED ORDER — ENOXAPARIN SODIUM 40 MG/0.4ML IJ SOSY
40.0000 mg | PREFILLED_SYRINGE | INTRAMUSCULAR | Status: DC
Start: 2022-02-08 — End: 2022-02-09
  Administered 2022-02-08 – 2022-02-09 (×2): 40 mg via SUBCUTANEOUS
  Filled 2022-02-08 (×2): qty 0.4

## 2022-02-08 MED ORDER — ONDANSETRON HCL 4 MG PO TABS: 4.0000 mg | ORAL_TABLET | Freq: Four times a day (QID) | ORAL | Status: DC | PRN

## 2022-02-08 MED ORDER — METHOTREXATE 2.5 MG PO TABS: 20.0000 mg | ORAL_TABLET | ORAL | Status: DC

## 2022-02-08 MED ORDER — SODIUM CHLORIDE 0.9 % IV SOLN: INTRAVENOUS | Status: DC

## 2022-02-08 MED ORDER — IPRATROPIUM-ALBUTEROL 0.5-2.5 (3) MG/3ML IN SOLN
3.0000 mL | Freq: Four times a day (QID) | RESPIRATORY_TRACT | Status: DC
  Administered 2022-02-08 – 2022-02-09 (×4): 3 mL via RESPIRATORY_TRACT
  Filled 2022-02-08 (×4): qty 3

## 2022-02-08 MED ORDER — MAGNESIUM HYDROXIDE 400 MG/5ML PO SUSP: 30.0000 mL | Freq: Every day | ORAL | Status: DC | PRN

## 2022-02-08 MED ORDER — SODIUM CHLORIDE 0.9 % IV SOLN
1.0000 g | INTRAVENOUS | Status: DC
  Administered 2022-02-08 – 2022-02-09 (×2): 1 g via INTRAVENOUS
  Filled 2022-02-08: qty 10
  Filled 2022-02-08: qty 1

## 2022-02-08 MED ORDER — METHYLPREDNISOLONE SODIUM SUCC 40 MG IJ SOLR
40.0000 mg | Freq: Three times a day (TID) | INTRAMUSCULAR | Status: DC
  Administered 2022-02-08 – 2022-02-09 (×4): 40 mg via INTRAVENOUS
  Filled 2022-02-08 (×4): qty 1

## 2022-02-08 MED ORDER — HYDROCOD POLI-CHLORPHE POLI ER 10-8 MG/5ML PO SUER: 5.0000 mL | Freq: Two times a day (BID) | ORAL | Status: DC | PRN

## 2022-02-08 MED ORDER — BUPRENORPHINE HCL-NALOXONE HCL 8-2 MG SL SUBL
1.0000 | SUBLINGUAL_TABLET | Freq: Every day | SUBLINGUAL | Status: DC
  Administered 2022-02-08 – 2022-02-09 (×2): 1 via SUBLINGUAL
  Filled 2022-02-08 (×2): qty 1

## 2022-02-08 MED ORDER — FENTANYL CITRATE PF 50 MCG/ML IJ SOSY
50.0000 ug | PREFILLED_SYRINGE | Freq: Once | INTRAMUSCULAR | Status: AC
  Administered 2022-02-08: 50 ug via INTRAVENOUS
  Filled 2022-02-08: qty 1

## 2022-02-08 MED ORDER — GUAIFENESIN ER 600 MG PO TB12
600.0000 mg | ORAL_TABLET | Freq: Two times a day (BID) | ORAL | Status: DC
  Administered 2022-02-08 – 2022-02-09 (×3): 600 mg via ORAL
  Filled 2022-02-08 (×4): qty 1

## 2022-02-08 NOTE — Progress Notes (Signed)
Patient admitted from ED to room 114 due to COPD exacerbation. VSS with 2 L of O2 via [place]. No complaints of pain.  Patient A+Ox4. New PIV requested for MIVF and IV Antibiotic therapy. Will continue to monitor and assess with plan of care.

## 2022-02-08 NOTE — ED Notes (Addendum)
Room air SpO2 88%. Pt placed on 3L [place]

## 2022-02-08 NOTE — Plan of Care (Signed)

## 2022-02-08 NOTE — H&P (Addendum)
Jennifer Vasquez   PATIENT NAME: Jennifer Vasquez    MR#:  [id number]  DATE OF BIRTH:  [birth year]  DATE OF ADMISSION:  02/07/2022  PRIMARY CARE PHYSICIAN: [facility]   Patient is coming from: Home  REQUESTING/REFERRING PHYSICIAN: Ward, Layla Maw, DO  CHIEF COMPLAINT:   Chief Complaint  Patient presents with   Shortness of Breath   Altered Mental Status    HISTORY OF PRESENT ILLNESS:  Jennifer Vasquez is a 36 y.o. Caucasian female with medical history significant for asthma, bipolar disorder, CHF, COPD, DDD, hepatitis C and tobacco abuse, who presented to the ER with acute onset of worsening dyspnea with associated cough and wheezing with associated generalized weakness.  Upon arrival of EMS she was fairly somnolent as she is on Suboxone she was given IV Narcan after which she was restless and agitated.  She was also given IV Solu-Medrol and DuoNeb.  She required O2 at 2 L by nasal cannula.  No chest pain or palpitations.  No nausea or vomiting or diarrhea or abdominal pain.  No dysuria, oliguria, urinary frequency or urgency or hematuria or flank pain.  ED Course: Upon presenting to the emergency room, Pulsoxymeter was 94% on 2 L of O2 by nasal cannula vital signs were within normal later BP was 119/105.  Labs revealed VBG with pH 7.38 with 6 with HCO3 of 36.1.  BNP was 47.4 and high-sensitivity troponin I was 4 twice.  Urine drug screen was positive for cocaine and tricyclics EKG as reviewed by me : EKG showed sinus rhythm with a rate of 85 with PVCs and low voltage QRS with anteroseptal Q waves. Imaging: Portable chest x-ray showed increased peribronchial markings likely related to recent asthma exacerbation.  The patient was given I 80 mcg of IV fentanyl, DuoNebs twice, 1 mg of IV Ativan and 1 L bolus of IV normal saline.  She will be admitted to a medical telemetry bed for further evaluation and management. PAST MEDICAL HISTORY:   Past Medical History:   Diagnosis Date   Acute carpal tunnel syndrome 10/10/2014   Anxiety state 09/03/2009   Asthma    Bipolar disorder (HCC) 02/09/2013   CHF (congestive heart failure) (HCC)    COPD (chronic obstructive pulmonary disease) (HCC)    DDD (degenerative disc disease), cervical 11/13/2014   DDD (degenerative disc disease), lumbar 11/13/2014   Hepatitis C    Lumbosacral radiculopathy 11/13/2014   Tobacco use disorder 09/06/2012    PAST SURGICAL HISTORY:   Past Surgical History:  Procedure Laterality Date   CERVICAL DISC ARTHROPLASTY     TONSILLECTOMY     TUBAL LIGATION      SOCIAL HISTORY:   Social History   Tobacco Use   Smoking status: Some Days    Packs/day: 0.50    Types: Cigarettes   Smokeless tobacco: Never  Substance Use Topics   Alcohol use: Not Currently    Alcohol/week: 0.0 standard drinks of alcohol    FAMILY HISTORY:   Family History  Problem Relation Age of Onset   Arthritis Mother    Asthma Mother    Diabetes Mother    Hyperlipidemia Mother    Vision loss Mother    Alcohol abuse Father    Asthma Father    Diabetes Father    Heart disease Father    Depression Father    COPD Father    Cancer Father    Hypertension Father    Kidney disease  Father    Vision loss Father     DRUG ALLERGIES:   Allergies  Allergen Reactions   Trazodone And Nefazodone     Pt says she was told it could stop her heart   Acetaminophen     Other reaction(s): Other (qualifier value) Due to hep c   Amoxicillin-Pot Clavulanate Diarrhea and Nausea And Vomiting   Gabapentin Hives   Tetanus Toxoids    Tramadol Hives   Shellfish Allergy Rash    oysters    REVIEW OF SYSTEMS:   ROS As per history of present illness. All pertinent systems were reviewed above. Constitutional, HEENT, cardiovascular, respiratory, GI, GU, musculoskeletal, neuro, psychiatric, endocrine, integumentary and hematologic systems were reviewed and are otherwise negative/unremarkable except for positive findings  mentioned above in the HPI.   MEDICATIONS AT HOME:   Prior to Admission medications   Medication Sig Start Date End Date Taking? Authorizing Provider  buprenorphine-naloxone (SUBOXONE) 8-2 mg SUBL SL tablet Place 1 tablet under the tongue daily.   Yes [provider]  OXYGEN Inhale 2 L into the lungs daily. continuous   Yes [provider]  acetaminophen (TYLENOL) 500 MG tablet Take 500-1,000 mg by mouth every 6 (six) hours as needed for mild pain or moderate pain.    [provider]  methotrexate (RHEUMATREX) 2.5 MG tablet Take 20 mg by mouth once a week. 07/11/19   [provider]      VITAL SIGNS:  Blood pressure (!) 140/93, pulse 99, temperature 98.1 F (36.7 C), temperature source Oral, resp. rate 20, weight 90.7 kg, SpO2 94 %.  PHYSICAL EXAMINATION:  Physical Exam  GENERAL:  36 y.o.-year-old patient lying in the bed with no acute distress.  EYES: Pupils equal, round, reactive to light and accommodation. No scleral icterus. Extraocular muscles intact.  HEENT: Head atraumatic, normocephalic. Oropharynx and nasopharynx clear.  NECK:  Supple, no jugular venous distention. No thyroid enlargement, no tenderness.  LUNGS: Diffuse expiratory wheezes with diminished expiratory airflow and harsh vesicular breathing.   CARDIOVASCULAR: Regular rate and rhythm, S1, S2 normal. No murmurs, rubs, or gallops.  ABDOMEN: Soft, nondistended, nontender. Bowel sounds present. No organomegaly or mass.  EXTREMITIES: No pedal edema, cyanosis, or clubbing.  NEUROLOGIC: Cranial nerves II through XII are intact. Muscle strength 5/5 in all extremities. Sensation intact. Gait not checked.  PSYCHIATRIC: The patient is somnolent and restless when aroused.   SKIN: No obvious rash, lesion, or ulcer.   LABORATORY PANEL:   CBC Recent Labs  Lab 02/07/22 0002  WBC 11.3*  HGB 13.0  HCT 41.1  PLT 280    ------------------------------------------------------------------------------------------------------------------  Chemistries  Recent Labs  Lab 02/07/22 0002  NA 143  K 3.5  CL 105  CO2 28  GLUCOSE 111*  BUN 9  CREATININE 0.68  CALCIUM 9.0   ------------------------------------------------------------------------------------------------------------------  Cardiac Enzymes No results for input(s): "TROPONINI" in the last 168 hours. ------------------------------------------------------------------------------------------------------------------  RADIOLOGY:  DG Chest Portable 1 View  Result Date: 02/08/2022 CLINICAL DATA:  Cough and asthma attack EXAM: PORTABLE CHEST 1 VIEW COMPARISON:  11/08/2020 FINDINGS: Cardiac shadow is enlarged but stable. Lungs are well aerated bilaterally without focal confluent infiltrate. Increased central markings are noted which may be related to the recent asthma attack. Postsurgical changes in the cervical spine are seen. IMPRESSION: Increased peribronchial markings likely related to the recent asthma exacerbation. Electronically Signed   By: Alcide Clever M.D.   On: 02/08/2022 02:02      IMPRESSION AND PLAN:  Assessment and  Plan: * COPD exacerbation (HCC) - The patient will be admitted to the medical telemetry bed. - We will continue steroid therapy with IV Solu-Medrol. - We will continue bronchodilator therapy with DuoNebs 4 times daily and every 4 hours as needed. - Mucolytic therapy will be provided. - Given severity of COPD exacerbation she will be placed on IV Rocephin.  Acute respiratory failure with hypoxia (HCC) - O2 protocol will be provided. - Management otherwise as above.  Opioid withdrawal (HCC) - This was unmasked by Narcan in the setting of Suboxone therapy. - We will utilize as needed opiates and Ativan for withdrawal as needed.  Polysubstance abuse (HCC) - This includes Tobacco abuse, opioids and cocaine. - She will be  monitored for withdrawal while she is here. - She was counseled for cessation and will receive further counseling here.  Bipolar 1 disorder (HCC) - She is not currently on any medications for it.   DVT prophylaxis: Lovenox.  Advanced Care Planning:  Code Status: full code.  Family Communication:  The plan of care was discussed in details with the patient (and family). I answered all questions. The patient agreed to proceed with the above mentioned plan. Further management will depend upon hospital course. Disposition Plan: Back to previous home environment Consults called: none.  All the records are reviewed and case discussed with ED provider.  Status is: Inpatient   At the time of the admission, it appears that the appropriate admission status for this patient is inpatient.  This is judged to be reasonable and necessary in order to provide the required intensity of service to ensure the patient's safety given the presenting symptoms, physical exam findings and initial radiographic and laboratory data in the context of comorbid conditions.  The patient requires inpatient status due to high intensity of service, high risk of further deterioration and high frequency of surveillance required.  I certify that at the time of admission, it is my clinical judgment that the patient will require inpatient hospital care extending more than 2 midnights.                            Dispo: The patient is from: Home              Anticipated d/c is to: Home              Patient currently is not medically stable to d/c.              Difficult to place patient: No  Hannah Beat M.D on 02/08/2022 at 4:52 AM  [facility]   From 7 PM-7 AM, contact night-coverage [email or web address]  CC: Primary care physician; [facility]

## 2022-02-08 NOTE — Final Progress Note (Signed)
Same day rounding progress note  Patient seen and examined while in the ED.  Waiting for the floor bed.  Please see Dr. Chrys Racer dictated history and physical.  I agree with his assessment and plan.  COPD exacerbation Continue current management.  Patient has not slept very well for last few days  Husband updated at bedside  Time spent 50 minutes

## 2022-02-08 NOTE — Assessment & Plan Note (Signed)
-   This was unmasked by Narcan in the setting of Suboxone therapy. - We will utilize as needed opiates and Ativan for withdrawal as needed.

## 2022-02-08 NOTE — Assessment & Plan Note (Signed)
-   O2 protocol will be provided. - Management otherwise as above.

## 2022-02-08 NOTE — Assessment & Plan Note (Signed)
-   She is not currently on any medications for it.

## 2022-02-08 NOTE — ED Notes (Signed)
Spoke with patients significant other James. Update given.

## 2022-02-08 NOTE — ED Notes (Signed)
Has bed assigned

## 2022-02-08 NOTE — Assessment & Plan Note (Signed)
-   The patient will be admitted to the medical telemetry bed. - We will continue steroid therapy with IV Solu-Medrol. - We will continue bronchodilator therapy with DuoNebs 4 times daily and every 4 hours as needed. - Mucolytic therapy will be provided. - Given severity of COPD exacerbation she will be placed on IV Rocephin.

## 2022-02-08 NOTE — ED Provider Notes (Signed)
[facility] Provider Note    Event Date/Time   First MD Initiated Contact with Patient 02/07/22 2305     (approximate)   History   Shortness of Breath and Altered Mental Status   HPI  Jennifer Vasquez is a 36 y.o. female with history of bipolar disorder, COPD, continued tobacco use, CHF, hepatitis C, substance use disorder currently on Suboxone who presents to the emergency department with an asthma exacerbation.  Significant other reports that she got up from bed tonight with cough, wheezing and shortness of breath.  He states he helped her get back from the bathroom and she was very weak.  No loss of consciousness.  Has been altered intermittently with EMS but no apnea.  Received 2 DuoNebs with EMS, 125 mg of RV Solu-Medrol and 2 mg of IV Narcan.  Patient awake but agitated.  Not on oxygen chronically but requiring 2 L nasal cannula here.  Denies any drug or alcohol use.  States she took only 1 Suboxone today.   History provided by patient, family and EMS.    Past Medical History:  Diagnosis Date   Acute carpal tunnel syndrome 10/10/2014   Anxiety state 09/03/2009   Asthma    Bipolar disorder (HCC) 02/09/2013   CHF (congestive heart failure) (HCC)    COPD (chronic obstructive pulmonary disease) (HCC)    DDD (degenerative disc disease), cervical 11/13/2014   DDD (degenerative disc disease), lumbar 11/13/2014   Hepatitis C    Lumbosacral radiculopathy 11/13/2014   Tobacco use disorder 09/06/2012    Past Surgical History:  Procedure Laterality Date   CERVICAL DISC ARTHROPLASTY     TONSILLECTOMY     TUBAL LIGATION      MEDICATIONS:  Prior to Admission medications   Medication Sig Start Date End Date Taking? Authorizing Provider  acetaminophen (TYLENOL) 500 MG tablet Take 500-1,000 mg by mouth every 6 (six) hours as needed for mild pain or moderate pain.    [provider]  albuterol (PROVENTIL HFA;VENTOLIN HFA) 108 (90 Base) MCG/ACT inhaler Inhale  2 puffs into the lungs every 6 (six) hours as needed for wheezing or shortness of breath. 01/17/18   Merrily Brittle, MD  albuterol (PROVENTIL) (2.5 MG/3ML) 0.083% nebulizer solution Take 2.5 mg by nebulization every 4 (four) hours as needed for wheezing or shortness of breath. Patient not taking: Reported on 11/09/2020    [provider]  budesonide-formoterol (SYMBICORT) 160-4.5 MCG/ACT inhaler Inhale 2 puffs into the lungs 2 (two) times daily. 07/13/19 07/12/20  [provider]  clonazePAM (KLONOPIN) 0.5 MG tablet Take 0.5 mg by mouth at bedtime as needed (sleep).  Patient not taking: Reported on 11/09/2020    [provider]  cyclobenzaprine (FLEXERIL) 10 MG tablet Take 10 mg by mouth every 8 (eight) hours as needed for muscle spasms. 09/22/20   [provider]  dextromethorphan-guaiFENesin (MUCINEX DM) 30-600 MG 12hr tablet Take 1 tablet by mouth 2 (two) times daily as needed for cough (congestion).  Patient not taking: Reported on 11/09/2020    [provider]  diphenhydrAMINE (BENADRYL) 25 mg capsule Take 25 mg by mouth daily.    [provider]  divalproex (DEPAKOTE ER) 500 MG 24 hr tablet Take 2,000 mg by mouth daily. 10/11/19 11/10/19  [provider]  docusate sodium (COLACE) 100 MG capsule Take 200 mg by mouth daily.    [provider]  enoxaparin (LOVENOX) 100 MG/ML injection Inject 100 mg into the skin every 12 (twelve)  hours.    [provider]  famotidine (PEPCID) 20 MG tablet Take 20 mg by mouth 2 (two) times daily.    [provider]  famotidine-calcium carbonate-magnesium hydroxide (PEPCID COMPLETE) 10-800-165 MG chewable tablet Chew 1 tablet by mouth 2 (two) times daily as needed (heartburn or stomach acid).    [provider]  ferrous sulfate 325 (65 FE) MG tablet Take 325 mg by mouth daily with breakfast. Patient not taking: Reported on 11/09/2020    [provider]  fluticasone (FLONASE)  50 MCG/ACT nasal spray Place 1 spray into both nostrils daily.    [provider]  fluticasone-salmeterol (WIXELA INHUB) 500-50 MCG/ACT AEPB Inhale 1 puff into the lungs daily.    [provider]  folic acid (FOLVITE) 1 MG tablet Take 1 mg by mouth daily.    [provider]  furosemide (LASIX) 20 MG tablet Take 20 mg by mouth daily.     [provider]  HYDROcodone-acetaminophen (NORCO/VICODIN) 5-325 MG tablet Take 1 tablet by mouth every 4 (four) hours as needed for moderate pain. 08/21/21   Minna Antis, MD  hydrOXYzine (ATARAX/VISTARIL) 25 MG tablet Take 50 mg by mouth every 6 (six) hours as needed for anxiety. Patient not taking: Reported on 11/09/2020 07/18/19   [provider]  levothyroxine (SYNTHROID, LEVOTHROID) 25 MCG tablet Take 25 mcg by mouth daily before breakfast. Patient not taking: Reported on 11/09/2020    [provider]  lidocaine (LIDODERM) 5 % Place 1 patch onto the skin daily. Remove & Discard patch within 12 hours or as directed by MD Patient not taking: Reported on 11/09/2020 04/27/19   Enid Derry, PA-C  loratadine (CLARITIN) 10 MG tablet Take 10 mg by mouth daily. Patient not taking: Reported on 11/09/2020    [provider]  methotrexate (RHEUMATREX) 2.5 MG tablet Take 20 mg by mouth once a week. 07/11/19   [provider]  metoprolol succinate (TOPROL-XL) 25 MG 24 hr tablet Take 12.5 mg by mouth daily.    [provider]  naproxen sodium (ALEVE) 220 MG tablet Take 220-440 mg by mouth 2 (two) times daily as needed (pain). Patient not taking: Reported on 11/09/2020    [provider]  OXYGEN Inhale 2 L into the lungs daily. continuous    [provider]  predniSONE (DELTASONE) 5 MG tablet Take 7.5 mg by mouth daily with breakfast.    [provider]  pregabalin (LYRICA) 25 MG capsule Take 25 mg by mouth 3 (three) times daily. Patient not taking: Reported on 11/09/2020  09/03/19   [provider]  propranolol (INDERAL) 10 MG tablet Take 10 mg by mouth 2 (two) times daily as needed for anxiety. 09/10/19 12/09/20  [provider]  sodium chloride (OCEAN) 0.65 % SOLN nasal spray Place 1 spray into both nostrils as needed for congestion.    [provider]  Tiotropium Bromide Monohydrate 2.5 MCG/ACT AERS Inhale 1 puff into the lungs daily.    [provider]  tiZANidine (ZANAFLEX) 4 MG tablet Take 4 mg by mouth every 6 (six) hours as needed for muscle spasms.  Patient not taking: Reported on 11/09/2020    [provider]  traMADol (ULTRAM) 50 MG tablet Take 50 mg by mouth at bedtime as needed for severe pain.  Patient not taking: Reported on 11/09/2020    [provider]  traZODone (DESYREL) 100 MG tablet Take 100 mg by mouth at bedtime.    [provider]  Physical Exam   Triage Vital Signs: ED Triage Vitals [02/07/22 2318]  Enc Vitals Group     BP 120/64     Pulse Rate 89     Resp 20     Temp 97.6 F (36.4 C)     Temp Source Oral     SpO2 94 %     Weight      Height      Head Circumference      Peak Flow      Pain Score      Pain Loc      Pain Edu?      Excl. in GC?     Most recent vital signs: Vitals:   02/08/22 0100 02/08/22 0117  BP: (!) 140/93   Pulse: 99   Resp:    Temp:  98.1 F (36.7 C)  SpO2:      CONSTITUTIONAL: Alert and oriented and responds appropriately to questions.  Chronically ill-appearing.  Appears extremely agitated and unable to sit still in the bed. HEAD: Normocephalic, atraumatic EYES: Conjunctivae clear, pupils appear equal, sclera nonicteric ENT: normal nose; moist mucous membranes NECK: Supple, normal ROM CARD: Regular and tachycardic; S1 and S2 appreciated; no murmurs, no clicks, no rubs, no gallops RESP: Patient is slightly tachypneic.  She has diffuse inspiratory and expiratory wheezing and diminished aeration at her bases.  No rhonchi or rales.  No  hypoxia on 2 L nasal cannula. ABD/GI: Normal bowel sounds; non-distended; soft, non-tender, no rebound, no guarding, no peritoneal signs BACK: The back appears normal EXT: Normal ROM in all joints; no deformity noted, no edema; no cyanosis, no calf tenderness or calf swelling SKIN: Normal color for age and race; warm; no rash on exposed skin NEURO: Moves all extremities equally, normal speech, no facial asymmetry, difficult to perform full neuro exam due to patient's agitation    ED Results / Procedures / Treatments   LABS: (all labs ordered are listed, but only abnormal results are displayed) Labs Reviewed  CBC WITH DIFFERENTIAL/PLATELET - Abnormal; Notable for the following components:      Result Value   WBC 11.3 (*)    RDW 15.6 (*)    Neutro Abs 8.6 (*)    All other components within normal limits  BASIC METABOLIC PANEL - Abnormal; Notable for the following components:   Glucose, Bld 111 (*)    All other components within normal limits  BLOOD GAS, VENOUS - Abnormal; Notable for the following components:   pCO2, Ven 61 (*)    Bicarbonate 36.1 (*)    Acid-Base Excess 8.7 (*)    All other components within normal limits  CBG MONITORING, ED - Abnormal; Notable for the following components:   Glucose-Capillary 108 (*)    All other components within normal limits  SARS CORONAVIRUS 2 BY RT PCR  HCG, QUANTITATIVE, PREGNANCY  ETHANOL  BRAIN NATRIURETIC PEPTIDE  URINE DRUG SCREEN, QUALITATIVE ([facility] ONLY)  TROPONIN I (HIGH SENSITIVITY)  TROPONIN I (HIGH SENSITIVITY)     EKG:    Date: 02/08/2022  00:56  Rate: 86  Rhythm: normal sinus rhythm  QRS Axis: normal  Intervals: normal  ST/T Wave abnormalities: normal  Conduction Disutrbances: none  Narrative Interpretation: unremarkable    RADIOLOGY: My personal review and interpretation of imaging: Chest x-ray shows no infiltrate or edema.  No pneumothorax.  I have personally reviewed all radiology reports.   No results  found.   PROCEDURES:  Critical Care performed: Yes, see critical care procedure  note(s)   CRITICAL CARE Performed by: Rochele Raring   Total critical care time: 45 minutes  Critical care time was exclusive of separately billable procedures and treating other patients.  Critical care was necessary to treat or prevent imminent or life-threatening deterioration.  Critical care was time spent personally by me on the following activities: development of treatment plan with patient and/or surrogate as well as nursing, discussions with consultants, evaluation of patient's response to treatment, examination of patient, obtaining history from patient or surrogate, ordering and performing treatments and interventions, ordering and review of laboratory studies, ordering and review of radiographic studies, pulse oximetry and re-evaluation of patient's condition.   Marland Kitchen1-3 Lead EKG Interpretation  Performed by: Glori Machnik, Layla Maw, DO Authorized by: Kashus Karlen, Layla Maw, DO     Interpretation: normal     ECG rate:  89   ECG rate assessment: normal     Rhythm: sinus rhythm     Ectopy: none     Conduction: normal       IMPRESSION / MDM / ASSESSMENT AND PLAN / ED COURSE  I reviewed the triage vital signs and the nursing notes.    Patient here with shortness of breath, wheezing, asthma attack.  Also was reportedly altered with EMS and they gave Narcan.  She is now awake and extremely agitated.  The patient is on the cardiac monitor to evaluate for evidence of arrhythmia and/or significant heart rate changes.   DIFFERENTIAL DIAGNOSIS (includes but not limited to):   Asthma/COPD exacerbation, respiratory acidosis, pneumonia, CHF, PE, ACS, substance abuse   Patient's presentation is most consistent with acute presentation with potential threat to life or bodily function.   PLAN: We will obtain CBC, BMP, troponin, BNP, chest x-ray, COVID swab.  Will give breathing treatments.  She is slightly  hypotensive here.  Will give gentle IV hydration.  She is extremely agitated after getting Narcan and unable to sit still and stating that she wants to leave.  Have offered her Ativan to help with her discomfort which she agrees to.  She is requiring 2 L nasal cannula due to hypoxia here and has no oxygen requirement chronically.  Was reportedly altered with EMS but is awake, alert, moving all extremities with normal speech and no facial asymmetry here.  Low suspicion for any intracranial pathology.   MEDICATIONS GIVEN IN ED: Medications  ipratropium-albuterol (DUONEB) 0.5-2.5 (3) MG/3ML nebulizer solution 3 mL (has no administration in time range)  ipratropium-albuterol (DUONEB) 0.5-2.5 (3) MG/3ML nebulizer solution 3 mL (3 mLs Nebulization Given 02/08/22 0033)  LORazepam (ATIVAN) injection 1 mg (1 mg Intravenous Given 02/08/22 0006)  sodium chloride 0.9 % bolus 1,000 mL (1,000 mLs Intravenous New Bag/Given 02/08/22 0032)  fentaNYL (SUBLIMAZE) injection 50 mcg (50 mcg Intravenous Given 02/08/22 0032)     ED COURSE: Patient continues to be agitated despite Ativan.  She is going through opiate withdrawals due to the Narcan.  Will give dose of fentanyl here.   Patient seems to be resting comfortably.  Has required oxygen due to her COPD exacerbation and is not on oxygen chronically.  She has better aeration after multiple breathing treatments but still wheezing.  Will discuss with medicine for admission.   Labs show slight leukocytosis.  Normal electrolytes and renal function.  Troponin x2 negative.  BNP normal.  Chest x-ray reviewed and interpreted by myself radiology and shows no infiltrate, edema, pneumothorax.  CONSULTS:  Consulted and discussed patient's case with hospitalist, Dr. Arville Care.  I have recommended admission  and consulting physician agrees and will place admission orders.  Patient (and family if present) agree with this plan.   I reviewed all nursing notes, vitals, pertinent previous  records.  All labs, EKGs, imaging ordered have been independently reviewed and interpreted by myself.    OUTSIDE RECORDS REVIEWED: Reviewed patient's last family medicine visit on 02/22/2020.       FINAL CLINICAL IMPRESSION(S) / ED DIAGNOSES   Final diagnoses:  COPD exacerbation (HCC)  Acute respiratory failure with hypoxia (HCC)  Opiate withdrawal (HCC)     Rx / DC Orders   ED Discharge Orders     None        Note:  This document was prepared using Dragon voice recognition software and may include unintentional dictation errors.   Cailen Texeira, Layla Maw, DO 02/08/22 0214

## 2022-02-08 NOTE — Assessment & Plan Note (Addendum)
-   This includes Tobacco abuse, opioids and cocaine. - She will be monitored for withdrawal while she is here. - She was counseled for cessation and will receive further counseling here.

## 2022-02-08 NOTE — Progress Notes (Addendum)
Admission profile updated. ?

## 2022-02-09 MED ORDER — SENNOSIDES-DOCUSATE SODIUM 8.6-50 MG PO TABS
2.0000 | ORAL_TABLET | Freq: Two times a day (BID) | ORAL | Status: DC
  Administered 2022-02-09: 2 via ORAL
  Filled 2022-02-09: qty 2

## 2022-02-09 MED ORDER — METHYLPREDNISOLONE SODIUM SUCC 40 MG IJ SOLR: 40.0000 mg | Freq: Two times a day (BID) | INTRAMUSCULAR | Status: DC

## 2022-02-09 MED ORDER — IPRATROPIUM-ALBUTEROL 0.5-2.5 (3) MG/3ML IN SOLN
3.0000 mL | Freq: Three times a day (TID) | RESPIRATORY_TRACT | Status: DC
  Administered 2022-02-09: 3 mL via RESPIRATORY_TRACT
  Filled 2022-02-09: qty 3

## 2022-02-09 NOTE — Hospital Course (Signed)
36 y.o. Caucasian female with medical history significant for asthma, bipolar disorder, CHF, COPD, DDD, hepatitis C and tobacco abuse, who presented to the ER with acute onset of worsening dyspnea with associated cough and wheezing with associated generalized weakness admitted for COPD exacerbation.    7/31: UDS positive for cocaine and tricyclic's 8/1: Still symptomatic with cough, shortness of breath

## 2022-02-09 NOTE — Final Progress Note (Signed)
Informed by nursing that patient decided to leave AMA. She understands the risks of leaving AMA including possible death.  Time spent: 10 mins

## 2022-02-09 NOTE — Progress Notes (Signed)
Patient called out stating that she is leaving and someone needs to remove her IV.  Upon entering the room, patient was getting dressed and says that she has to go home.  Her spouse is present and states that she is fine to go home.  Patient educated that without discharge order, she will be leaving AMA which means her hospitalist does not think she should go home yet.  Patient voiced understanding of education, signed AMA form and left the unit with her husband.

## 2022-02-09 NOTE — Progress Notes (Signed)
  Progress Note   Patient: Jennifer Vasquez:[id number] DOB: 03-09-1986 DOA: 02/07/2022     1 DOS: the patient was seen and examined on 02/09/2022   Brief hospital course: 36 y.o. Caucasian female with medical history significant for asthma, bipolar disorder, CHF, COPD, DDD, hepatitis C and tobacco abuse, who presented to the ER with acute onset of worsening dyspnea with associated cough and wheezing with associated generalized weakness admitted for COPD exacerbation.    7/31: UDS positive for cocaine and tricyclic's 8/1: Still symptomatic with cough, shortness of breath   Assessment and Plan: * COPD exacerbation (HCC) - Continue IV Solu-Medrol. - continue bronchodilator therapy with DuoNebs 4 times daily and every 4 hours as needed. - Mucolytic therapy will be provided. - Empiric IV Rocephin.  Acute respiratory failure with hypoxia (HCC) - Currently on 2 L oxygen.  Wean oxygen as tolerated.  Check ambulatory pulse ox  Opiate withdrawal (HCC) - This was unmasked by Narcan in the setting of Suboxone therapy. - We will utilize as needed opiates and Ativan for withdrawal as needed  Polysubstance abuse (HCC) - This includes Tobacco abuse, opioids and cocaine. - She will be monitored for withdrawal while she is here. - She was counseled for cessation  -UDS positive for cocaine and tricyclic's.  Patient does admit to doing cocaine before admission here  Bipolar 1 disorder (HCC) - She is not currently on any medications for it        Subjective: Patient sleeping.  Still coughing and feeling short of breath.  Requiring 2 L oxygen by nasal cannula  Physical Exam: Vitals:   02/08/22 2341 02/09/22 0506 02/09/22 0732 02/09/22 0804  BP: 108/69 122/83  129/87  Pulse: 75 69  83  Resp: 20 18  17   Temp: 98.4 F (36.9 C) 97.9 F (36.6 C)  97.8 F (36.6 C)  TempSrc:      SpO2: 98% 100% 98% 99%  Weight:       36 year old female lying in the bed comfortably without any acute  distress Lungs decreased breath sounds at the bases mild expiratory wheezing throughout both lungs and rhonchi Cardiovascular regular rate and rhythm Abdomen soft, benign Neuro alert and oriented, nonfocal Psych normal mood and affect  Data Reviewed:  UDS positive for cocaine and tricyclic's  Family Communication: None today  Disposition: Status is: Inpatient Remains inpatient appropriate because: Improvement of symptoms and weaning off oxygen   Planned Discharge Destination: Home    DVT prophylaxis-Lovenox Time spent: 35 minutes  Author: 20, MD 02/09/2022 11:48 AM  For on call review [email or web address].

## 2022-02-17 NOTE — Discharge Summary (Signed)
Patient left AMA.

## 2023-08-15 ENCOUNTER — Other Ambulatory Visit: Payer: Self-pay

## 2023-08-15 ENCOUNTER — Encounter: Payer: Self-pay | Admitting: Emergency Medicine

## 2023-08-15 ENCOUNTER — Emergency Department
Admission: EM | Admit: 2023-08-15 | Discharge: 2023-08-15 | Disposition: A | Discharge status: Home / Self Care | Payer: Medicaid Other | Attending: Emergency Medicine | Admitting: Emergency Medicine

## 2023-08-15 ENCOUNTER — Emergency Department: Payer: Medicaid Other

## 2023-08-15 DIAGNOSIS — D72829 Elevated white blood cell count, unspecified: Secondary | ICD-10-CM | POA: Diagnosis not present

## 2023-08-15 DIAGNOSIS — R0789 Other chest pain: Secondary | ICD-10-CM | POA: Diagnosis present

## 2023-08-15 DIAGNOSIS — X58XXXA Exposure to other specified factors, initial encounter: Secondary | ICD-10-CM | POA: Diagnosis not present

## 2023-08-15 DIAGNOSIS — S20212A Contusion of left front wall of thorax, initial encounter: Secondary | ICD-10-CM | POA: Insufficient documentation

## 2023-08-15 DIAGNOSIS — J449 Chronic obstructive pulmonary disease, unspecified: Secondary | ICD-10-CM | POA: Diagnosis not present

## 2023-08-15 LAB — COMPREHENSIVE METABOLIC PANEL
ALT: 25 U/L (ref 0–44)
AST: 27 U/L (ref 15–41)
Albumin: 3.6 g/dL (ref 3.5–5.0)
Alkaline Phosphatase: 84 U/L (ref 38–126)
Anion gap: 12 (ref 5–15)
BUN: 11 mg/dL (ref 6–20)
CO2: 33 mmol/L — ABNORMAL HIGH (ref 22–32)
Calcium: 8.9 mg/dL (ref 8.9–10.3)
Chloride: 91 mmol/L — ABNORMAL LOW (ref 98–111)
Creatinine, Ser: 0.77 mg/dL (ref 0.44–1.00)
GFR, Estimated: >60 mL/min (ref >60)
Glucose, Bld: 144 mg/dL — ABNORMAL HIGH (ref 70–99)
Potassium: 3.6 mmol/L (ref 3.5–5.1)
Sodium: 136 mmol/L (ref 135–145)
Total Bilirubin: 0.6 mg/dL (ref 0.0–1.2)
Total Protein: 7.8 g/dL (ref 6.5–8.1)

## 2023-08-15 LAB — CBC WITH DIFFERENTIAL/PLATELET
Abs Immature Granulocytes: 0.16 10*3/uL — ABNORMAL HIGH (ref 0.00–0.07)
Basophils Absolute: 0.1 10*3/uL (ref 0.0–0.1)
Basophils Relative: 0 %
Eosinophils Absolute: 0 10*3/uL (ref 0.0–0.5)
Eosinophils Relative: 0 %
HCT: 36.9 % (ref 36.0–46.0)
Hemoglobin: 12.3 g/dL (ref 12.0–15.0)
Immature Granulocytes: 1 %
Lymphocytes Relative: 5 %
Lymphs Abs: 0.8 10*3/uL (ref 0.7–4.0)
MCH: 32 pg (ref 26.0–34.0)
MCHC: 33.3 g/dL (ref 30.0–36.0)
MCV: 96.1 fL (ref 80.0–100.0)
Monocytes Absolute: 0.7 10*3/uL (ref 0.1–1.0)
Monocytes Relative: 5 %
Neutro Abs: 13.9 10*3/uL — ABNORMAL HIGH (ref 1.7–7.7)
Neutrophils Relative %: 89 %
Platelets: 313 10*3/uL (ref 150–400)
RBC: 3.84 MIL/uL — ABNORMAL LOW (ref 3.87–5.11)
RDW: 12.8 % (ref 11.5–15.5)
WBC: 15.7 10*3/uL — ABNORMAL HIGH (ref 4.0–10.5)
nRBC: 0.3 % — ABNORMAL HIGH (ref 0.0–0.2)

## 2023-08-15 LAB — TROPONIN I (HIGH SENSITIVITY): Troponin I (High Sensitivity): 10 ng/L (ref <18)

## 2023-08-15 NOTE — ED Triage Notes (Addendum)
Arrives from home via [facility]. C?O left sided rib pain x 1 day.  EMS reports bruising to left side.  Denies injury, Patient states she felt pain after coughing. C/O cough x 1 week.  Patient with hx of Insterstitial lung disease and takes Bactrim and Prednisone.  129/89 120 94% on 6l/ [place] -- wears 4l/ [place] home oxygen.  Bruise to left flank. C/O pain to left upper abdomen, left chest.  C?O pain with coughing and sneezing

## 2023-08-15 NOTE — ED Notes (Signed)
See triage note Presents with pain to left flank area  States she fell a few days  Has not slept d/t pain

## 2023-08-15 NOTE — ED Notes (Signed)
Pt is awake  Coughing    Holding left rib area   yelling out

## 2023-08-15 NOTE — ED Notes (Signed)
Went into pt room to let pt know she needs to provide a urine sample, pt appears asleep. RR equal and unlabored, chest rise and fall noted strong radial pulse noted. Pt will not wake up to speak with this tech. Many attempts were made to wake pt up, pt will wake up for about a second and make eye contact with this tech but will fall back asleep. XRAY tech that took pt for xray advised this tech that pt told her during the XRAY that she had not slept well last night and that was why she kept falling asleep. Registration also could not wake pt up long enough to speak with pt.

## 2023-08-15 NOTE — ED Provider Notes (Signed)
[facility] Provider Note    Event Date/Time   First MD Initiated Contact with Patient 08/15/23 [id number]     (approximate)  History   Chief Complaint: Chest Pain  HPI  Jennifer Vasquez is a 38 y.o. female with a past medical history of anxiety, bipolar, COPD, substance abuse, presents to the emergency department for left chest/rib pain.  According to the patient for the last few days she has been experiencing pain on her left lateral chest wall/ribs worse with movement or pushing on the area.  Today she noticed a bruise to the area so she came to the emergency department.  Patient is somnolent will awaken to voice will answer questions however she will fall asleep midsentence.  Patient has interstitial lung disease takes chronic prednisone as well as Bactrim and wears 4 L chronically.  Patient does admit to cocaine use last night, she also is on prescribed Suboxone and states she took a dose this morning before coming to the emergency department.  Patient denies any other opioids.  Denies any fever.  Patient does not recall any falls or injuries.  Physical Exam   Triage Vital Signs: ED Triage Vitals  Encounter Vitals Group     BP 08/15/23 0712 (!) 132/101     Systolic BP Percentile --      Diastolic BP Percentile --      Pulse Rate 08/15/23 0712 (!) 114     Resp 08/15/23 0712 (!) 22     Temp 08/15/23 0712 (!) 97.5 F (36.4 C)     Temp Source 08/15/23 0712 Oral     SpO2 08/15/23 0712 96 %     Weight 08/15/23 0714 199 lb 15.3 oz (90.7 kg)     Height 08/15/23 0714 5\' 3"  (1.6 m)     Head Circumference --      Peak Flow --      Pain Score 08/15/23 0713 10     Pain Loc --      Pain Education --      Exclude from Growth Chart --     Most recent vital signs: Vitals:   08/15/23 0712  BP: (!) 132/101  Pulse: (!) 114  Resp: (!) 22  Temp: (!) 97.5 F (36.4 C)  SpO2: 96%    General: Awake, no distress.  CV:  Good peripheral perfusion.  Regular rate and  rhythm  Resp:  Normal effort.  Equal breath sounds bilaterally.  Moderate tenderness to palpation to the left lateral chest wall where she has an area of ecchymosis approximately 8 x 4 cm Abd:  No distention.  Soft, nontender.  No rebound or guarding.  ED Results / Procedures / Treatments   EKG  EKG viewed and interpreted by myself shows sinus tachycardia 111 bpm with a narrow QRS, normal axis, normal intervals, no concerning ST changes.  RADIOLOGY  I have reviewed and interpreted chest x-ray findings.  Patient appears to have cardiomegaly no obvious consolidation on my evaluation.  No obvious rib fracture seen on my evaluation.   MEDICATIONS ORDERED IN ED: Medications - No data to display   IMPRESSION / MDM / ASSESSMENT AND PLAN / ED COURSE  I reviewed the triage vital signs and the nursing notes.  Patient's presentation is most consistent with acute presentation with potential threat to life or bodily function.  Patient presents to the emergency department for left-sided chest wall pain and bruising.  Patient admits to recent cocaine use patient is on  Suboxone, she is falling asleep midsentence at times and then will wake up and continue her sentence.  Patient's presentation appears very consistent with substance use/opioid use.  Patient does admit to Suboxone but denies any other opioids.  We will check labs, obtain a chest x-ray as well as a urinalysis and urine drug screen.  Will continue to close monitor while awaiting further results.  Patient has been asleep for most of her visit in the emergency department the last few hours.  Patient's workup shows mild leukocytosis however largely unchanged from historical values, troponin is reassuringly negative, chemistry is reassuring and chest x-ray shows no concerning findings at least no acute changes.  Patient does have bruising to the area highly suspect this is more of a rib contusion or possibly a nondisplaced fracture.  Discussed with  patient as she is on Suboxone I would not recommend opioid medications but would recommend that she take Tylenol 1000 mg every 6 hours as well as use over-the-counter lidocaine patches.  Patient agreeable to plan of care.  FINAL CLINICAL IMPRESSION(S) / ED DIAGNOSES   Chest wall pain  Note:  This document was prepared using Dragon voice recognition software and may include unintentional dictation errors.   Minna Antis, MD 08/15/23 1319

## 2023-08-19 ENCOUNTER — Inpatient Hospital Stay
Admission: EM | Admit: 2023-08-19 | Discharge: 2023-08-21 | DRG: 871 | Disposition: A | Discharge status: Home / Self Care | Payer: Medicaid Other | Attending: Family Medicine | Admitting: Family Medicine

## 2023-08-19 ENCOUNTER — Emergency Department: Payer: Medicaid Other

## 2023-08-19 ENCOUNTER — Other Ambulatory Visit: Payer: Self-pay

## 2023-08-19 DIAGNOSIS — D649 Anemia, unspecified: Secondary | ICD-10-CM | POA: Diagnosis present

## 2023-08-19 DIAGNOSIS — R0602 Shortness of breath: Secondary | ICD-10-CM | POA: Diagnosis present

## 2023-08-19 DIAGNOSIS — S301XXA Contusion of abdominal wall, initial encounter: Secondary | ICD-10-CM | POA: Diagnosis present

## 2023-08-19 DIAGNOSIS — Z83438 Family history of other disorder of lipoprotein metabolism and other lipidemia: Secondary | ICD-10-CM

## 2023-08-19 DIAGNOSIS — F411 Generalized anxiety disorder: Secondary | ICD-10-CM | POA: Diagnosis present

## 2023-08-19 DIAGNOSIS — I509 Heart failure, unspecified: Secondary | ICD-10-CM | POA: Diagnosis present

## 2023-08-19 DIAGNOSIS — J189 Pneumonia, unspecified organism: Secondary | ICD-10-CM | POA: Diagnosis present

## 2023-08-19 DIAGNOSIS — E871 Hypo-osmolality and hyponatremia: Secondary | ICD-10-CM | POA: Diagnosis present

## 2023-08-19 DIAGNOSIS — R6520 Severe sepsis without septic shock: Secondary | ICD-10-CM | POA: Diagnosis present

## 2023-08-19 DIAGNOSIS — J9622 Acute and chronic respiratory failure with hypercapnia: Secondary | ICD-10-CM | POA: Diagnosis present

## 2023-08-19 DIAGNOSIS — X500XXA Overexertion from strenuous movement or load, initial encounter: Secondary | ICD-10-CM | POA: Diagnosis not present

## 2023-08-19 DIAGNOSIS — Z792 Long term (current) use of antibiotics: Secondary | ICD-10-CM

## 2023-08-19 DIAGNOSIS — Z1152 Encounter for screening for COVID-19: Secondary | ICD-10-CM

## 2023-08-19 DIAGNOSIS — Z818 Family history of other mental and behavioral disorders: Secondary | ICD-10-CM

## 2023-08-19 DIAGNOSIS — X503XXA Overexertion from repetitive movements, initial encounter: Secondary | ICD-10-CM

## 2023-08-19 DIAGNOSIS — Z9981 Dependence on supplemental oxygen: Secondary | ICD-10-CM | POA: Diagnosis not present

## 2023-08-19 DIAGNOSIS — Z79899 Other long term (current) drug therapy: Secondary | ICD-10-CM

## 2023-08-19 DIAGNOSIS — Z8619 Personal history of other infectious and parasitic diseases: Secondary | ICD-10-CM

## 2023-08-19 DIAGNOSIS — F1120 Opioid dependence, uncomplicated: Secondary | ICD-10-CM | POA: Diagnosis present

## 2023-08-19 DIAGNOSIS — Z9851 Tubal ligation status: Secondary | ICD-10-CM

## 2023-08-19 DIAGNOSIS — Z885 Allergy status to narcotic agent status: Secondary | ICD-10-CM

## 2023-08-19 DIAGNOSIS — Z825 Family history of asthma and other chronic lower respiratory diseases: Secondary | ICD-10-CM

## 2023-08-19 DIAGNOSIS — A419 Sepsis, unspecified organism: Secondary | ICD-10-CM | POA: Diagnosis present

## 2023-08-19 DIAGNOSIS — Z888 Allergy status to other drugs, medicaments and biological substances status: Secondary | ICD-10-CM

## 2023-08-19 DIAGNOSIS — Z716 Tobacco abuse counseling: Secondary | ICD-10-CM

## 2023-08-19 DIAGNOSIS — K5900 Constipation, unspecified: Secondary | ICD-10-CM | POA: Diagnosis present

## 2023-08-19 DIAGNOSIS — Z8249 Family history of ischemic heart disease and other diseases of the circulatory system: Secondary | ICD-10-CM | POA: Diagnosis not present

## 2023-08-19 DIAGNOSIS — Z8261 Family history of arthritis: Secondary | ICD-10-CM

## 2023-08-19 DIAGNOSIS — R21 Rash and other nonspecific skin eruption: Secondary | ICD-10-CM | POA: Diagnosis present

## 2023-08-19 DIAGNOSIS — J9621 Acute and chronic respiratory failure with hypoxia: Secondary | ICD-10-CM | POA: Diagnosis present

## 2023-08-19 DIAGNOSIS — Z7952 Long term (current) use of systemic steroids: Secondary | ICD-10-CM

## 2023-08-19 DIAGNOSIS — J441 Chronic obstructive pulmonary disease with (acute) exacerbation: Secondary | ICD-10-CM | POA: Diagnosis present

## 2023-08-19 DIAGNOSIS — F32A Depression, unspecified: Secondary | ICD-10-CM | POA: Diagnosis present

## 2023-08-19 DIAGNOSIS — F17200 Nicotine dependence, unspecified, uncomplicated: Secondary | ICD-10-CM | POA: Diagnosis present

## 2023-08-19 DIAGNOSIS — J188 Other pneumonia, unspecified organism: Secondary | ICD-10-CM | POA: Diagnosis present

## 2023-08-19 DIAGNOSIS — Z833 Family history of diabetes mellitus: Secondary | ICD-10-CM

## 2023-08-19 DIAGNOSIS — J849 Interstitial pulmonary disease, unspecified: Secondary | ICD-10-CM

## 2023-08-19 DIAGNOSIS — E878 Other disorders of electrolyte and fluid balance, not elsewhere classified: Secondary | ICD-10-CM | POA: Diagnosis present

## 2023-08-19 DIAGNOSIS — F17210 Nicotine dependence, cigarettes, uncomplicated: Secondary | ICD-10-CM | POA: Diagnosis present

## 2023-08-19 DIAGNOSIS — F319 Bipolar disorder, unspecified: Secondary | ICD-10-CM | POA: Diagnosis present

## 2023-08-19 DIAGNOSIS — E876 Hypokalemia: Secondary | ICD-10-CM | POA: Diagnosis present

## 2023-08-19 DIAGNOSIS — J440 Chronic obstructive pulmonary disease with acute lower respiratory infection: Secondary | ICD-10-CM | POA: Diagnosis present

## 2023-08-19 DIAGNOSIS — Z821 Family history of blindness and visual loss: Secondary | ICD-10-CM

## 2023-08-19 DIAGNOSIS — T380X5A Adverse effect of glucocorticoids and synthetic analogues, initial encounter: Secondary | ICD-10-CM | POA: Diagnosis present

## 2023-08-19 DIAGNOSIS — Z880 Allergy status to penicillin: Secondary | ICD-10-CM

## 2023-08-19 DIAGNOSIS — M069 Rheumatoid arthritis, unspecified: Secondary | ICD-10-CM | POA: Insufficient documentation

## 2023-08-19 DIAGNOSIS — Z841 Family history of disorders of kidney and ureter: Secondary | ICD-10-CM

## 2023-08-19 DIAGNOSIS — Z887 Allergy status to serum and vaccine status: Secondary | ICD-10-CM

## 2023-08-19 DIAGNOSIS — Z886 Allergy status to analgesic agent status: Secondary | ICD-10-CM

## 2023-08-19 DIAGNOSIS — Z79890 Hormone replacement therapy (postmenopausal): Secondary | ICD-10-CM

## 2023-08-19 LAB — RESP PANEL BY RT-PCR (RSV, FLU A&B, COVID)  RVPGX2
Influenza A by PCR: NEGATIVE
Influenza B by PCR: NEGATIVE
Resp Syncytial Virus by PCR: NEGATIVE
SARS Coronavirus 2 by RT PCR: NEGATIVE

## 2023-08-19 LAB — CBC
HCT: 36.1 % (ref 36.0–46.0)
Hemoglobin: 11.7 g/dL — ABNORMAL LOW (ref 12.0–15.0)
MCH: 31.5 pg (ref 26.0–34.0)
MCHC: 32.4 g/dL (ref 30.0–36.0)
MCV: 97.3 fL (ref 80.0–100.0)
Platelets: 326 10*3/uL (ref 150–400)
RBC: 3.71 MIL/uL — ABNORMAL LOW (ref 3.87–5.11)
RDW: 13.1 % (ref 11.5–15.5)
WBC: 19.4 10*3/uL — ABNORMAL HIGH (ref 4.0–10.5)
nRBC: 0 % (ref 0.0–0.2)

## 2023-08-19 LAB — BASIC METABOLIC PANEL
Anion gap: 12 (ref 5–15)
BUN: 10 mg/dL (ref 6–20)
CO2: 33 mmol/L — ABNORMAL HIGH (ref 22–32)
Calcium: 8.3 mg/dL — ABNORMAL LOW (ref 8.9–10.3)
Chloride: 88 mmol/L — ABNORMAL LOW (ref 98–111)
Creatinine, Ser: 0.77 mg/dL (ref 0.44–1.00)
GFR, Estimated: >60 mL/min (ref >60)
Glucose, Bld: 232 mg/dL — ABNORMAL HIGH (ref 70–99)
Potassium: 3.4 mmol/L — ABNORMAL LOW (ref 3.5–5.1)
Sodium: 133 mmol/L — ABNORMAL LOW (ref 135–145)

## 2023-08-19 LAB — TROPONIN I (HIGH SENSITIVITY)
Troponin I (High Sensitivity): 6 ng/L (ref <18)
Troponin I (High Sensitivity): 6 ng/L (ref <18)

## 2023-08-19 LAB — HCG, QUANTITATIVE, PREGNANCY: hCG, Beta Chain, Quant, S: 1 m[IU]/mL (ref <5)

## 2023-08-19 LAB — LACTIC ACID, PLASMA
Lactic Acid, Venous: 1.5 mmol/L (ref 0.5–1.9)
Lactic Acid, Venous: 1.8 mmol/L (ref 0.5–1.9)

## 2023-08-19 MED ORDER — ENOXAPARIN SODIUM 60 MG/0.6ML IJ SOSY
45.0000 mg | PREFILLED_SYRINGE | INTRAMUSCULAR | Status: DC
  Administered 2023-08-20 – 2023-08-21 (×2): 45 mg via SUBCUTANEOUS
  Filled 2023-08-19 (×2): qty 0.6

## 2023-08-19 MED ORDER — ONDANSETRON HCL 4 MG PO TABS: 4.0000 mg | ORAL_TABLET | Freq: Four times a day (QID) | ORAL | Status: DC | PRN

## 2023-08-19 MED ORDER — LACTATED RINGERS IV BOLUS
500.0000 mL | Freq: Once | INTRAVENOUS | Status: AC
  Administered 2023-08-19: 500 mL via INTRAVENOUS

## 2023-08-19 MED ORDER — SODIUM CHLORIDE 0.9 % IV SOLN
2.0000 g | Freq: Once | INTRAVENOUS | Status: AC
  Administered 2023-08-19: 2 g via INTRAVENOUS
  Filled 2023-08-19: qty 10

## 2023-08-19 MED ORDER — SODIUM CHLORIDE 0.9 % IV SOLN
500.0000 mg | INTRAVENOUS | Status: DC
  Administered 2023-08-20: 500 mg via INTRAVENOUS
  Filled 2023-08-19 (×2): qty 5

## 2023-08-19 MED ORDER — AZITHROMYCIN 500 MG PO TABS
500.0000 mg | ORAL_TABLET | Freq: Once | ORAL | Status: AC
  Administered 2023-08-19: 500 mg via ORAL
  Filled 2023-08-19: qty 1

## 2023-08-19 MED ORDER — METHYLPREDNISOLONE SODIUM SUCC 125 MG IJ SOLR
125.0000 mg | INTRAMUSCULAR | Status: AC
  Administered 2023-08-19: 125 mg via INTRAVENOUS
  Filled 2023-08-19: qty 2

## 2023-08-19 MED ORDER — ACETAMINOPHEN 325 MG RE SUPP: 650.0000 mg | Freq: Four times a day (QID) | RECTAL | Status: DC | PRN

## 2023-08-19 MED ORDER — MAGNESIUM HYDROXIDE 400 MG/5ML PO SUSP: 30.0000 mL | Freq: Every day | ORAL | Status: DC | PRN

## 2023-08-19 MED ORDER — BUPRENORPHINE HCL-NALOXONE HCL 8-2 MG SL SUBL: 1.0000 | SUBLINGUAL_TABLET | Freq: Every day | SUBLINGUAL | Status: DC

## 2023-08-19 MED ORDER — IPRATROPIUM-ALBUTEROL 0.5-2.5 (3) MG/3ML IN SOLN
3.0000 mL | Freq: Once | RESPIRATORY_TRACT | Status: AC
  Administered 2023-08-19: 3 mL via RESPIRATORY_TRACT
  Filled 2023-08-19: qty 3

## 2023-08-19 MED ORDER — TRAZODONE HCL 50 MG PO TABS: 25.0000 mg | ORAL_TABLET | Freq: Every evening | ORAL | Status: DC | PRN

## 2023-08-19 MED ORDER — SODIUM CHLORIDE 0.9 % IV SOLN
2.0000 g | INTRAVENOUS | Status: DC
  Administered 2023-08-20 – 2023-08-21 (×2): 2 g via INTRAVENOUS
  Filled 2023-08-19 (×2): qty 20

## 2023-08-19 MED ORDER — AZATHIOPRINE 50 MG PO TABS
100.0000 mg | ORAL_TABLET | Freq: Every day | ORAL | Status: DC
  Administered 2023-08-20 – 2023-08-21 (×2): 100 mg via ORAL
  Filled 2023-08-19 (×2): qty 2

## 2023-08-19 MED ORDER — ONDANSETRON HCL 4 MG/2ML IJ SOLN: 4.0000 mg | Freq: Four times a day (QID) | INTRAMUSCULAR | Status: DC | PRN

## 2023-08-19 MED ORDER — VANCOMYCIN HCL IN DEXTROSE 1-5 GM/200ML-% IV SOLN
1000.0000 mg | Freq: Once | INTRAVENOUS | Status: AC
  Administered 2023-08-19: 1000 mg via INTRAVENOUS
  Filled 2023-08-19: qty 200

## 2023-08-19 MED ORDER — IOHEXOL 300 MG/ML  SOLN
100.0000 mL | Freq: Once | INTRAMUSCULAR | Status: AC | PRN
  Administered 2023-08-19: 100 mL via INTRAVENOUS

## 2023-08-19 MED ORDER — LACTATED RINGERS IV SOLN
150.0000 mL/h | INTRAVENOUS | Status: DC
  Administered 2023-08-20: 150 mL/h via INTRAVENOUS

## 2023-08-19 MED ORDER — ACETAMINOPHEN 325 MG PO TABS: 650.0000 mg | ORAL_TABLET | Freq: Four times a day (QID) | ORAL | Status: DC | PRN

## 2023-08-19 NOTE — ED Provider Notes (Addendum)
 [facility] Provider Note    Event Date/Time   First MD Initiated Contact with Patient 08/19/23 2003     (approximate)   History   Shortness of Breath   HPI  Jennifer Vasquez is a 38 y.o. female history of interstitial lung disease with oxygen dependence on preventative Bactrim   Patient reports that she has had a cough for few days now.  She started develop chills fever and more productive cough.  She is concerned she may have pneumonia and takes a daily preventative Bactrim due to her interstitial lung disease for which she is on 4 L at home  She is beginning to have increased shortness of breath.  She also reports that she has had several days of a large bruise or rash over her lower abdomen.  She does not recall how it happened but denies any trauma or fall  She denies pregnancy.  Reports previous tubal ligation  Physical Exam   Triage Vital Signs: ED Triage Vitals  Encounter Vitals Group     BP 08/19/23 1738 129/74     Systolic BP Percentile --      Diastolic BP Percentile --      Pulse Rate 08/19/23 1743 (!) 109     Resp 08/19/23 1738 (!) 26     Temp 08/19/23 1738 97.9 F (36.6 C)     Temp src --      SpO2 08/19/23 1738 (!) 87 %     Weight --      Height --      Head Circumference --      Peak Flow --      Pain Score 08/19/23 1740 8     Pain Loc --      Pain Education --      Exclude from Growth Chart --     Most recent vital signs: Vitals:   08/19/23 2230 08/19/23 2300  BP: 122/76 118/73  Pulse: 90 90  Resp: 11 13  Temp:    SpO2: 95% 95%     General: Awake, no distress.  She has diaphoresis on her forehead.  She is resting comfortably on arrival to the room wakes up easily to touch.  She is able to stand on her own to show me the area of bruising over her lower abdomen and sit back into the bed without assistance. CV:  Good peripheral perfusion.  Mild tachycardia no murmur Resp:  Normal effort.  As she has scant expiratory  wheezing and rales through lower lobes bilateral.  Occasional cough. Abd:  No distention.  No obvious distention but has notable ecchymosis and bruising primarily across the left lower flank and left lower anterior abdomen.  Denies any injury.  Advises started after coughing possibly Other:  No lower extremity edema venous cords or congestion   ED Results / Procedures / Treatments   Labs (all labs ordered are listed, but only abnormal results are displayed) Labs Reviewed  BASIC METABOLIC PANEL - Abnormal; Notable for the following components:      Result Value   Sodium 133 (*)    Potassium 3.4 (*)    Chloride 88 (*)    CO2 33 (*)    Glucose, Bld 232 (*)    Calcium 8.3 (*)    All other components within normal limits  CBC - Abnormal; Notable for the following components:   WBC 19.4 (*)    RBC 3.71 (*)    Hemoglobin 11.7 (*)  All other components within normal limits  RESP PANEL BY RT-PCR (RSV, FLU A&B, COVID)  RVPGX2  CULTURE, BLOOD (ROUTINE X 2)  CULTURE, BLOOD (ROUTINE X 2)  LACTIC ACID, PLASMA  LACTIC ACID, PLASMA  HCG, QUANTITATIVE, PREGNANCY  TROPONIN I (HIGH SENSITIVITY)  TROPONIN I (HIGH SENSITIVITY)     EKG  Interpreted by me at 1745 heart rate 100 QRS 80 QTc 440 Sinus tachycardia no frank ischemia   RADIOLOGY  Chest x-ray inter by me as diffuse interstitial opacities  CT ABDOMEN PELVIS W CONTRAST Result Date: 08/19/2023 CLINICAL DATA:  Shortness of breath x1 week with decreased appetite, chest pain and cough. EXAM: CT ABDOMEN AND PELVIS WITH CONTRAST TECHNIQUE: Multidetector CT imaging of the abdomen and pelvis was performed using the standard protocol following bolus administration of intravenous contrast. RADIATION DOSE REDUCTION: This exam was performed according to the departmental dose-optimization program which includes automated exposure control, adjustment of the mA and/or kV according to patient size and/or use of iterative reconstruction technique.  CONTRAST:  OMNIPAQUE  IOHEXOL  300 MG/ML  SOLN COMPARISON:  September 21, 2019 FINDINGS: Lower chest: Extensive patchy bilateral airspace disease is noted within the visualized portions of the bilateral lower lobes. Hepatobiliary: No focal liver abnormality is seen. The gallbladder is contracted. No gallstones, gallbladder wall thickening, or biliary dilatation. Pancreas: Unremarkable. No pancreatic ductal dilatation or surrounding inflammatory changes. Spleen: Normal in size without focal abnormality. Adrenals/Urinary Tract: Adrenal glands are unremarkable. Kidneys are normal, without renal calculi, focal lesion, or hydronephrosis. Bladder is unremarkable. Stomach/Bowel: Stomach is within normal limits. Appendix appears normal. No evidence of bowel wall thickening, distention, or inflammatory changes. Vascular/Lymphatic: There is moderate severity calcification of the right common iliac artery. No enlarged abdominal or pelvic lymph nodes. Reproductive: Uterus and bilateral adnexa are unremarkable. Other: No abdominal wall hernia or abnormality. No abdominopelvic ascites. Musculoskeletal: No acute or significant osseous findings. IMPRESSION: 1. Extensive patchy bilateral airspace disease within the visualized portions of the bilateral lower lobes, consistent with multifocal pneumonia. 2. No acute or active process within the abdomen or pelvis. Electronically Signed   By: Suzen Dials M.D.   On: 08/19/2023 22:07   DG Chest 2 View Result Date: 08/19/2023 CLINICAL DATA:  Chest pain, short of breath for 1 week, productive cough EXAM: CHEST - 2 VIEW COMPARISON:  08/15/2023 FINDINGS: Frontal and lateral views of the chest demonstrate an unremarkable cardiac silhouette. Since the prior exam, diffuse interstitial and ground-glass opacities have developed throughout the lungs, left greater than right. No effusion or pneumothorax. No acute bony abnormalities. IMPRESSION: 1. Interval development of diffuse interstitial  and ground-glass opacities, favor multifocal atypical pneumonia such as viral or mycoplasma. Electronically Signed   By: Ozell Daring M.D.   On: 08/19/2023 18:38      PROCEDURES:  Critical Care performed: Yes, see critical care procedure note(s)  CRITICAL CARE Performed by: Oneil Budge   Total critical care time: 30 minutes  Critical care time was exclusive of separately billable procedures and treating other patients.  Critical care was necessary to treat or prevent imminent or life-threatening deterioration.  Critical care was time spent personally by me on the following activities: development of treatment plan with patient and/or surrogate as well as nursing, discussions with consultants, evaluation of patient's response to treatment, examination of patient, obtaining history from patient or surrogate, ordering and performing treatments and interventions, ordering and review of laboratory studies, ordering and review of radiographic studies, pulse oximetry and re-evaluation of patient's condition.  Procedures   MEDICATIONS ORDERED IN ED: Medications  aztreonam  (AZACTAM ) 2 g in sodium chloride  0.9 % 100 mL IVPB (0 g Intravenous Stopped 08/19/23 2119)  vancomycin  (VANCOCIN ) IVPB 1000 mg/200 mL premix (1,000 mg Intravenous New Bag/Given 08/19/23 2118)  lactated ringers  bolus 500 mL (500 mLs Intravenous New Bag/Given 08/19/23 2036)  ipratropium-albuterol  (DUONEB) 0.5-2.5 (3) MG/3ML nebulizer solution 3 mL (3 mLs Nebulization Given 08/19/23 2036)  methylPREDNISolone  sodium succinate (SOLU-MEDROL ) 125 mg/2 mL injection 125 mg (125 mg Intravenous Given 08/19/23 2036)  azithromycin  (ZITHROMAX ) tablet 500 mg (500 mg Oral Given 08/19/23 2035)  iohexol  (OMNIPAQUE ) 300 MG/ML solution 100 mL (100 mLs Intravenous Contrast Given 08/19/23 2142)     IMPRESSION / MDM / ASSESSMENT AND PLAN / ED COURSE  I reviewed the triage vital signs and the nursing notes.                              Differential  diagnosis includes, but is not limited to, pneumonia including potential resistant or atypical pneumonia, COPD exacerbation, exacerbation of underlying interstitial lung disease bronchiectasis etc.  No chest pain.  Clinical examination history labs seem most suggestive of acute infectious cause.  No associated pleuritic pain or clinical findings would be highly suggestive of thromboembolism especially given the associated history.  She does also have notable bruising and ecchymosis across her left flank and lower abdomen does not recall any sort of injury or trauma.  I am ordering a CT to evaluate but given the history it is possible that she could have suffered an abdominal muscle strain or tear from coughing leading to this, but I do wish to exclude intra-abdominal abnormality hematoma formation etc.  It does not blanch and I suspect this is not consistent with a cellulitic change over the lower abdomen  Discussed with the patient and she is understanding agreeable with the need for admission.  Patient's presentation is most consistent with acute presentation with potential threat to life or bodily function.   ----------------------------------------- 10:31 PM on 08/19/2023 ----------------------------------------- S abdominal CT without acute intra-abdominal findings.  Patient resting comfortably at this time aware of plan for admission currently receiving antibiotics  Ongoing plan of care including need to discuss admission with hospitalist assigned to my partner Dr. Cyrena.  Currently awaiting callback from hospitalist service for admission.       FINAL CLINICAL IMPRESSION(S) / ED DIAGNOSES   Final diagnoses:  Multifocal pneumonia  Interstitial lung disease (HCC)  Contusion of abdominal wall, initial encounter     Rx / DC Orders   ED Discharge Orders     None        Note:  This document was prepared using Dragon voice recognition software and may include unintentional  dictation errors.   Dicky Anes, MD 08/19/23 2313  ----------------------------------------- 11:19 PM on 08/19/2023 ----------------------------------------- Consulted with patient accepted to hospitalist by Dr. Lawence Dicky Anes, MD 08/19/23 [phone]

## 2023-08-19 NOTE — Progress Notes (Signed)
 PHARMACIST - PHYSICIAN COMMUNICATION  CONCERNING:  Enoxaparin  (Lovenox ) for DVT Prophylaxis    RECOMMENDATION: Patient was prescribed enoxaprin 40mg  q24 hours for VTE prophylaxis.   There were no vitals filed for this visit.  There is no height or weight on file to calculate BMI.  Estimated Creatinine Clearance: 100.9 mL/min (by C-G formula based on SCr of 0.77 mg/dL).   Based on [facility] policy patient is candidate for enoxaparin  0.5mg /kg TBW SQ every 24 hours based on BMI being >30.  DESCRIPTION: Pharmacy has adjusted enoxaparin  dose per [facility] policy.  Patient is now receiving enoxaparin  0.5 mg/kg every 24 hours   Rankin CANDIE Dills, PharmD, [facility] 08/19/2023 11:33 PM

## 2023-08-19 NOTE — H&P (Signed)
 [place]   PATIENT NAME: Jennifer Vasquez    MR#:  [id number]  DATE OF BIRTH:  [birth year]  DATE OF ADMISSION:  08/19/2023  PRIMARY CARE PHYSICIAN: [facility]   Patient is coming from: Home  REQUESTING/REFERRING PHYSICIAN: Dicky Anes, MD  CHIEF COMPLAINT:   Chief Complaint  Patient presents with  . Shortness of Breath    HISTORY OF PRESENT ILLNESS:  Jennifer Vasquez is a 38 y.o. Caucasian female with medical history significant for asthma, bipolar disorder, CHF, COPD, hepatitis C and ongoing tobacco abuse as well as anxiety, who presented to the emergency room with acute onset of cough with associated body aches and diaphoresis, dyspnea and occasional wheezing over the last week.  She has been expectorating greenish sputum.  She admitted to tactile fever and chills.  She has been having nausea without vomiting.  She admitted to constipation and diminished urine output.  No dysuria, hematuria or flank pain.  She is on home O2 at 3 to 4 L/min for interstitial lung disease.  She is on Suboxone  for history of opioid dependence.  She was lethargic in the ER and somnolent but arousable.  ED Course: When the patient came to the ER, Pulsoxymeter was 87% on 4 L of O2 nasal cannula and later 96% on 6 L, respiratory rate was 26 with otherwise normal vital signs.  Labs revealed blood gas with pH of 7.37 and pCO2 66 with pO2 of 81 with HCO3 38.2 with O2 sat of 96.4% on 6 L.  CBC showed leukocytosis 19.4 with mild anemia.  Lactic acid was 1.5 and later 1.8 and high sensitive troponin I was 6 and later the same.  BMP revealed mild hyponatremia 133 and hypochloremia of 88, hypokalemia 3.4 and a blood glucose of 232.  Calcium was 8.3  EKG as reviewed by me : EKG showed sinus tachycardia with rate 103. Imaging: 2 view chest x-ray showed interval development of diffuse interstitial and groundglass opacities favoring multifocal atypical pneumonia such as viral or mycoplasma.  The  patient was given IV thiamine p.o. Zithromax  was 500 mL IV normal saline bolus, 125 mg of IV Solu-Medrol  and 1 DuoNeb.  To be admitted to a medical telemetry bed for further evaluation and management. PAST MEDICAL HISTORY:   Past Medical History:  Diagnosis Date  . Acute carpal tunnel syndrome 10/10/2014  . Anxiety state 09/03/2009  . Asthma   . Bipolar disorder (HCC) 02/09/2013  . CHF (congestive heart failure) (HCC)   . COPD (chronic obstructive pulmonary disease) (HCC)   . DDD (degenerative disc disease), cervical 11/13/2014  . DDD (degenerative disc disease), lumbar 11/13/2014  . Hepatitis C   . Lumbosacral radiculopathy 11/13/2014  . Tobacco use disorder 09/06/2012  -Opioid dependence on Suboxone .  PAST SURGICAL HISTORY:   Past Surgical History:  Procedure Laterality Date  . CERVICAL DISC ARTHROPLASTY    . TONSILLECTOMY    . TUBAL LIGATION      SOCIAL HISTORY:   Social History   Tobacco Use  . Smoking status: Some Days    Current packs/day: 0.50    Types: Cigarettes  . Smokeless tobacco: Never  Substance Use Topics  . Alcohol use: Not Currently    Alcohol/week: 0.0 standard drinks of alcohol    FAMILY HISTORY:   Family History  Problem Relation Age of Onset  . Arthritis Mother   . Asthma Mother   . Diabetes Mother   . Hyperlipidemia Mother   .  Vision loss Mother   . Alcohol abuse Father   . Asthma Father   . Diabetes Father   . Heart disease Father   . Depression Father   . COPD Father   . Cancer Father   . Hypertension Father   . Kidney disease Father   . Vision loss Father     DRUG ALLERGIES:   Allergies  Allergen Reactions  . Trazodone  And Nefazodone     Pt says she was told it could stop her heart  . Acetaminophen      Other reaction(s): Other (qualifier value) Due to hep c  . Amoxicillin -Pot Clavulanate Diarrhea and Nausea And Vomiting  . Baclofen Other (See Comments)    headaches  . Gabapentin Hives  . Tetanus Toxoids   . Tramadol Hives  .  Shellfish Allergy Rash    oysters    REVIEW OF SYSTEMS:   ROS As per history of present illness. All pertinent systems were reviewed above. Constitutional, HEENT, cardiovascular, respiratory, GI, GU, musculoskeletal, neuro, psychiatric, endocrine, integumentary and hematologic systems were reviewed and are otherwise negative/unremarkable except for positive findings mentioned above in the HPI.   MEDICATIONS AT HOME:   Prior to Admission medications   Medication Sig Start Date End Date Taking? Authorizing Provider  acetaminophen  (TYLENOL ) 500 MG tablet Take 500-1,000 mg by mouth every 6 (six) hours as needed for mild pain or moderate pain.    [provider]  albuterol  (VENTOLIN  HFA) 108 (90 Base) MCG/ACT inhaler Inhale 1-2 puffs into the lungs every 6 (six) hours as needed for wheezing or shortness of breath.    [provider]  azaTHIOprine  (IMURAN ) 50 MG tablet Take 50 mg by mouth daily.    [provider]  buprenorphine -naloxone  (SUBOXONE ) 8-2 mg SUBL SL tablet Place 1 tablet under the tongue daily.    [provider]  cyclobenzaprine  (FLEXERIL ) 10 MG tablet Take 1 tablet by mouth 3 (three) times daily as needed. 03/03/20   [provider]  escitalopram  (LEXAPRO ) 20 MG tablet Take 20 mg by mouth daily.    [provider]  famotidine  (PEPCID ) 40 MG tablet Take 40 mg by mouth daily.    [provider]  fluticasone  (FLONASE ) 50 MCG/ACT nasal spray Place 1 spray into both nostrils daily.    [provider]  fluticasone -salmeterol (WIXELA INHUB) 500-50 MCG/ACT AEPB Inhale 1 puff into the lungs in the morning and at bedtime.    [provider]  furosemide  (LASIX ) 40 MG tablet Take 40 mg by mouth daily.    [provider]  ipratropium-albuterol  (DUONEB) 0.5-2.5 (3) MG/3ML SOLN Take 3 mLs by nebulization.    [provider]  methotrexate  (RHEUMATREX) 2.5 MG tablet Take 20 mg by mouth once a  week. Patient not taking: Reported on 02/08/2022 07/11/19   [provider]  nicotine  (NICODERM CQ  - DOSED IN MG/24 HOURS) 14 mg/24hr patch Place 14 mg onto the skin daily.    [provider]  nortriptyline  (PAMELOR ) 10 MG capsule Take 10 mg by mouth at bedtime.    [provider]  OLANZapine  (ZYPREXA ) 5 MG tablet Take 5 mg by mouth at bedtime.    [provider]  OXYGEN Inhale 2 L into the lungs daily. continuous    [provider]  predniSONE  (DELTASONE ) 10 MG tablet Take 10 mg by mouth daily with breakfast.    [provider]  pregabalin  (LYRICA ) 100 MG capsule Take 1 capsule by mouth in the morning, at  noon, and at bedtime. 03/03/20   [provider]  rizatriptan (MAXALT) 5 MG tablet Take 1 tablet by mouth 2 (two) times daily as needed. Patient not taking: Reported on 02/08/2022 01/18/20   [provider]      VITAL SIGNS:  Blood pressure 115/68, pulse 87, temperature 97.6 F (36.4 C), temperature source Axillary, resp. rate 14, last menstrual period 08/03/2023, SpO2 95%.  PHYSICAL EXAMINATION:  Physical Exam  GENERAL: Acutely ill 38 y.o.-year-old, Caucasian female patient lying in the bed with mild respiratory distress with conversational dyspnea.  She was fairly lethargic and somnolent but arousable.  She was later placed on BiPAP. HEENT: Head atraumatic, normocephalic. Oropharynx and nasopharynx clear.  NECK:  Supple, no jugular venous distention. No thyroid enlargement, no tenderness.  LUNGS: Normal breath sounds bilaterally, no wheezing, rales,rhonchi or crepitation. No use of accessory muscles of respiration.  CARDIOVASCULAR: Regular rate and rhythm, S1, S2 normal. No murmurs, rubs, or gallops.  ABDOMEN: Soft, nondistended, nontender. Bowel sounds present. No organomegaly or mass.  EXTREMITIES: No pedal edema, cyanosis, or clubbing.  NEUROLOGIC: Cranial nerves II through XII are intact. Muscle strength 5/5 in all  extremities. Sensation intact. Gait not checked.  PSYCHIATRIC: The patient is alert and oriented x 3.  Normal affect and good eye contact. SKIN: No obvious rash, lesion, or ulcer.   LABORATORY PANEL:   CBC Recent Labs  Lab 08/20/23 0339  WBC 16.0*  HGB 10.6*  HCT 32.2*  PLT 297   ------------------------------------------------------------------------------------------------------------------  Chemistries  Recent Labs  Lab 08/15/23 0743 08/19/23 1744  NA 136 133*  K 3.6 3.4*  CL 91* 88*  CO2 33* 33*  GLUCOSE 144* 232*  BUN 11 10  CREATININE 0.77 0.77  CALCIUM 8.9 8.3*  AST 27  --   ALT 25  --   ALKPHOS 84  --   BILITOT 0.6  --    ------------------------------------------------------------------------------------------------------------------  Cardiac Enzymes No results for input(s): TROPONINI in the last 168 hours. ------------------------------------------------------------------------------------------------------------------  RADIOLOGY:  CT ABDOMEN PELVIS W CONTRAST Result Date: 08/19/2023 CLINICAL DATA:  Shortness of breath x1 week with decreased appetite, chest pain and cough. EXAM: CT ABDOMEN AND PELVIS WITH CONTRAST TECHNIQUE: Multidetector CT imaging of the abdomen and pelvis was performed using the standard protocol following bolus administration of intravenous contrast. RADIATION DOSE REDUCTION: This exam was performed according to the departmental dose-optimization program which includes automated exposure control, adjustment of the mA and/or kV according to patient size and/or use of iterative reconstruction technique. CONTRAST:  OMNIPAQUE  IOHEXOL  300 MG/ML  SOLN COMPARISON:  September 21, 2019 FINDINGS: Lower chest: Extensive patchy bilateral airspace disease is noted within the visualized portions of the bilateral lower lobes. Hepatobiliary: No focal liver abnormality is seen. The gallbladder is contracted. No gallstones, gallbladder wall thickening, or  biliary dilatation. Pancreas: Unremarkable. No pancreatic ductal dilatation or surrounding inflammatory changes. Spleen: Normal in size without focal abnormality. Adrenals/Urinary Tract: Adrenal glands are unremarkable. Kidneys are normal, without renal calculi, focal lesion, or hydronephrosis. Bladder is unremarkable. Stomach/Bowel: Stomach is within normal limits. Appendix appears normal. No evidence of bowel wall thickening, distention, or inflammatory changes. Vascular/Lymphatic: There is moderate severity calcification of the right common iliac artery. No enlarged abdominal or pelvic lymph nodes. Reproductive: Uterus and bilateral adnexa are unremarkable. Other: No abdominal wall hernia or abnormality. No abdominopelvic ascites. Musculoskeletal: No acute or significant osseous findings. IMPRESSION: 1. Extensive patchy bilateral airspace disease within the visualized portions of the bilateral lower lobes, consistent with multifocal pneumonia.  2. No acute or active process within the abdomen or pelvis. Electronically Signed   By: Suzen Dials M.D.   On: 08/19/2023 22:07   DG Chest 2 View Result Date: 08/19/2023 CLINICAL DATA:  Chest pain, short of breath for 1 week, productive cough EXAM: CHEST - 2 VIEW COMPARISON:  08/15/2023 FINDINGS: Frontal and lateral views of the chest demonstrate an unremarkable cardiac silhouette. Since the prior exam, diffuse interstitial and ground-glass opacities have developed throughout the lungs, left greater than right. No effusion or pneumothorax. No acute bony abnormalities. IMPRESSION: 1. Interval development of diffuse interstitial and ground-glass opacities, favor multifocal atypical pneumonia such as viral or mycoplasma. Electronically Signed   By: Ozell Daring M.D.   On: 08/19/2023 18:38      IMPRESSION AND PLAN:  Assessment and Plan: * Acute on chronic respiratory failure with hypoxia and hypercapnia (HCC) - The patient Jennifer be placed on BiPAP overnight. -  Jennifer follow ABG. - Jennifer follow mental status. - Management otherwise as below.  Sepsis due to pneumonia [facility]) - The patient Jennifer be admitted to a medical telemetry bed. - Jennifer continue antibiotic therapy with IV Rocephin  and Zithromax . - Mucolytic therapy be provided as well as duo nebs q.i.d. and q.4 hours p.r.n. - We Jennifer follow blood cultures. - The patient be hydrated with IV lactated ringer . - Meet severe sepsis criteria given her worsening hypoxia.   Multifocal pneumonia - This likely the culprit for her sepsis and respiratory failure. - Management otherwise as above.  COPD with acute exacerbation (HCC) - We Jennifer continue steroid therapy with IV Solu-Medrol . - We Jennifer continue bronchodilator therapy with DuoNebs 4 times daily and every 4 hours as needed. - Mucolytic therapy Jennifer be provided.  Tobacco use disorder - She was counseled for smoking cessation and Jennifer receive further counseling here.  Rheumatoid arthritis (HCC) - We Jennifer continue Imuran .  Opioid dependence (HCC) - Continue Suboxone   Depression - We Jennifer continue her antidepressants including Lexapro  and Zyprexa ..   DVT prophylaxis: Lovenox .  Advanced Care Planning:  Code Status: full code.  Family Communication:  The plan of care was discussed in details with the patient (and family). I answered all questions. The patient agreed to proceed with the above mentioned plan. Further management Jennifer depend upon hospital course. Disposition Plan: Back to previous home environment Consults called: none.  All the records are reviewed and case discussed with ED provider.  Status is: Inpatient    At the time of the admission, it appears that the appropriate admission status for this patient is inpatient.  This is judged to be reasonable and necessary in order to provide the required intensity of service to ensure the patient's safety given the presenting symptoms, physical exam findings and initial radiographic  and laboratory data in the context of comorbid conditions.  The patient requires inpatient status due to high intensity of service, high risk of further deterioration and high frequency of surveillance required.  I certify that at the time of admission, it is my clinical judgment that the patient Jennifer require inpatient hospital care extending more than 2 midnights.                            Dispo: The patient is from: Home              Anticipated d/c is to: Home              Patient  currently is not medically stable to d/c.              Difficult to place patient: No  Authorized and performed by: Madison Peaches, MD Total critical care time:   55     minutes. Due to a high probability of clinically significant, life-threatening deterioration, the patient required my highest level of preparedness to intervene emergently and I personally spent this critical care time directly and personally managing the patient.  This critical care time included obtaining a history, examining the patient, pulse oximetry, ordering and review of studies, arranging urgent treatment with development of management plan, evaluation of patient's response to treatment, frequent reassessment, and discussions with other providers. This critical care time was performed to assess and manage the high probability of imminent, life-threatening deterioration that could result in multiorgan failure.  It was exclusive of separately billable procedures and treating other patients and teaching time.   Madison DELENA Peaches M.D on 08/20/2023 at 4:01 AM  [facility]   From 7 PM-7 AM, contact night-coverage [email or web address]  CC: Primary care physician; [facility]

## 2023-08-19 NOTE — Consult Note (Signed)
 CODE SEPSIS - PHARMACY COMMUNICATION  **Broad Spectrum Antibiotics should be administered within 1 hour of Sepsis diagnosis**  Time Code Sepsis Called/Page Received: 2016  Antibiotics Ordered: Azithromycin , Vancomycin  and Aztreonam   Time of 1st antibiotic administration: Azithromycin  po x 1 dose given at 2035  Additional action taken by pharmacy: none  If necessary, Name of Provider/Nurse Contacted: n/a  Nahara Dona Rodriguez-Guzman PharmD, BCPS 08/19/2023 8:41 PM

## 2023-08-19 NOTE — ED Triage Notes (Signed)
 Pt c/o SHOB x1 week and decreased appetite. Pt states she always has pneumonia, is on oxygen chronically- 4 liter's [place] at this time. PT also endorses left CP that is worse with productive cough. Pt states she's coughing up green phlegm.

## 2023-08-19 NOTE — Sepsis Progress Note (Signed)
 Elink monitoring for the code sepsis protocol.

## 2023-08-19 NOTE — ED Notes (Signed)
 Pt to ED with c/o shortness of breath, chest discomfort, discoloration to abdomen, constipation and painful urination when she is able to urinate. Offers this has been going on for 1 week. Cardiac monitor applied. CB within reach. Family at bedside. Encouraged to alert staff to any needs.

## 2023-08-20 DIAGNOSIS — J9622 Acute and chronic respiratory failure with hypercapnia: Secondary | ICD-10-CM

## 2023-08-20 DIAGNOSIS — J189 Pneumonia, unspecified organism: Secondary | ICD-10-CM | POA: Diagnosis not present

## 2023-08-20 DIAGNOSIS — J441 Chronic obstructive pulmonary disease with (acute) exacerbation: Secondary | ICD-10-CM

## 2023-08-20 DIAGNOSIS — M069 Rheumatoid arthritis, unspecified: Secondary | ICD-10-CM | POA: Insufficient documentation

## 2023-08-20 DIAGNOSIS — A419 Sepsis, unspecified organism: Secondary | ICD-10-CM

## 2023-08-20 DIAGNOSIS — F1120 Opioid dependence, uncomplicated: Secondary | ICD-10-CM | POA: Insufficient documentation

## 2023-08-20 DIAGNOSIS — J9621 Acute and chronic respiratory failure with hypoxia: Secondary | ICD-10-CM

## 2023-08-20 LAB — BLOOD GAS, ARTERIAL
Acid-Base Excess: 10.2 mmol/L — ABNORMAL HIGH (ref 0.0–2.0)
Bicarbonate: 38.2 mmol/L — ABNORMAL HIGH (ref 20.0–28.0)
O2 Content: 5 L/min
O2 Saturation: 96.4 %
Patient temperature: 37
pCO2 arterial: 66 mm[Hg] (ref 32–48)
pH, Arterial: 7.37 (ref 7.35–7.45)
pO2, Arterial: 81 mm[Hg] — ABNORMAL LOW (ref 83–108)

## 2023-08-20 LAB — BASIC METABOLIC PANEL
Anion gap: 9 (ref 5–15)
BUN: 9 mg/dL (ref 6–20)
CO2: 35 mmol/L — ABNORMAL HIGH (ref 22–32)
Calcium: 8.2 mg/dL — ABNORMAL LOW (ref 8.9–10.3)
Chloride: 90 mmol/L — ABNORMAL LOW (ref 98–111)
Creatinine, Ser: 0.69 mg/dL (ref 0.44–1.00)
GFR, Estimated: >60 mL/min (ref >60)
Glucose, Bld: 323 mg/dL — ABNORMAL HIGH (ref 70–99)
Potassium: 4 mmol/L (ref 3.5–5.1)
Sodium: 134 mmol/L — ABNORMAL LOW (ref 135–145)

## 2023-08-20 LAB — URINALYSIS, ROUTINE W REFLEX MICROSCOPIC
Bilirubin Urine: NEGATIVE
Glucose, UA: 50 mg/dL — AB
Hgb urine dipstick: NEGATIVE
Ketones, ur: NEGATIVE mg/dL
Leukocytes,Ua: NEGATIVE
Nitrite: NEGATIVE
Protein, ur: NEGATIVE mg/dL
Specific Gravity, Urine: 1.027 (ref 1.005–1.030)
pH: 6 (ref 5.0–8.0)

## 2023-08-20 LAB — CBC
HCT: 32.2 % — ABNORMAL LOW (ref 36.0–46.0)
Hemoglobin: 10.6 g/dL — ABNORMAL LOW (ref 12.0–15.0)
MCH: 32.1 pg (ref 26.0–34.0)
MCHC: 32.9 g/dL (ref 30.0–36.0)
MCV: 97.6 fL (ref 80.0–100.0)
Platelets: 297 10*3/uL (ref 150–400)
RBC: 3.3 MIL/uL — ABNORMAL LOW (ref 3.87–5.11)
RDW: 13.2 % (ref 11.5–15.5)
WBC: 16 10*3/uL — ABNORMAL HIGH (ref 4.0–10.5)
nRBC: 0 % (ref 0.0–0.2)

## 2023-08-20 LAB — URINE DRUG SCREEN, QUALITATIVE (ARMC ONLY)
Amphetamines, Ur Screen: NOT DETECTED
Barbiturates, Ur Screen: NOT DETECTED
Benzodiazepine, Ur Scrn: NOT DETECTED
Cannabinoid 50 Ng, Ur ~~LOC~~: NOT DETECTED
Cocaine Metabolite,Ur ~~LOC~~: POSITIVE — AB
MDMA (Ecstasy)Ur Screen: NOT DETECTED
Methadone Scn, Ur: NOT DETECTED
Opiate, Ur Screen: NOT DETECTED
Phencyclidine (PCP) Ur S: NOT DETECTED
Tricyclic, Ur Screen: POSITIVE — AB

## 2023-08-20 LAB — BRAIN NATRIURETIC PEPTIDE: B Natriuretic Peptide: 33.9 pg/mL (ref 0.0–100.0)

## 2023-08-20 LAB — PROCALCITONIN: Procalcitonin: <0.1 ng/mL

## 2023-08-20 LAB — PROTIME-INR
INR: 1.1 (ref 0.8–1.2)
Prothrombin Time: 14.3 s (ref 11.4–15.2)

## 2023-08-20 LAB — CORTISOL-AM, BLOOD: Cortisol - AM: 6.1 ug/dL — ABNORMAL LOW (ref 6.7–22.6)

## 2023-08-20 LAB — HIV ANTIBODY (ROUTINE TESTING W REFLEX): HIV Screen 4th Generation wRfx: NONREACTIVE

## 2023-08-20 MED ORDER — ALBUTEROL SULFATE (2.5 MG/3ML) 0.083% IN NEBU
2.5000 mg | INHALATION_SOLUTION | RESPIRATORY_TRACT | Status: DC | PRN
  Administered 2023-08-21: 2.5 mg via RESPIRATORY_TRACT
  Filled 2023-08-20: qty 3

## 2023-08-20 MED ORDER — BUPRENORPHINE HCL-NALOXONE HCL 8-2 MG SL SUBL: 1.0000 | SUBLINGUAL_TABLET | Freq: Every day | SUBLINGUAL | Status: DC

## 2023-08-20 MED ORDER — CYCLOBENZAPRINE HCL 10 MG PO TABS
10.0000 mg | ORAL_TABLET | Freq: Three times a day (TID) | ORAL | Status: DC | PRN
  Administered 2023-08-20 – 2023-08-21 (×3): 10 mg via ORAL
  Filled 2023-08-20 (×3): qty 1

## 2023-08-20 MED ORDER — IPRATROPIUM-ALBUTEROL 0.5-2.5 (3) MG/3ML IN SOLN: 3.0000 mL | RESPIRATORY_TRACT | Status: DC | PRN

## 2023-08-20 MED ORDER — BUPRENORPHINE HCL-NALOXONE HCL 8-2 MG SL SUBL
1.0000 | SUBLINGUAL_TABLET | Freq: Once | SUBLINGUAL | Status: AC
  Administered 2023-08-20: 1 via SUBLINGUAL
  Filled 2023-08-20 (×2): qty 1

## 2023-08-20 MED ORDER — FUROSEMIDE 40 MG PO TABS
40.0000 mg | ORAL_TABLET | Freq: Every day | ORAL | Status: DC
  Administered 2023-08-20 – 2023-08-21 (×2): 40 mg via ORAL
  Filled 2023-08-20 (×2): qty 1

## 2023-08-20 MED ORDER — HYDROCOD POLI-CHLORPHE POLI ER 10-8 MG/5ML PO SUER
5.0000 mL | Freq: Two times a day (BID) | ORAL | Status: DC | PRN
  Administered 2023-08-20 – 2023-08-21 (×3): 5 mL via ORAL
  Filled 2023-08-20 (×3): qty 5

## 2023-08-20 MED ORDER — ACETAMINOPHEN 325 MG PO TABS
650.0000 mg | ORAL_TABLET | Freq: Four times a day (QID) | ORAL | Status: DC | PRN
  Administered 2023-08-20 – 2023-08-21 (×3): 650 mg via ORAL
  Filled 2023-08-20 (×3): qty 2

## 2023-08-20 MED ORDER — BUPRENORPHINE HCL-NALOXONE HCL 8-2 MG SL SUBL
1.0000 | SUBLINGUAL_TABLET | Freq: Every day | SUBLINGUAL | Status: DC
  Administered 2023-08-20 – 2023-08-21 (×2): 1 via SUBLINGUAL
  Filled 2023-08-20 (×2): qty 1

## 2023-08-20 MED ORDER — IPRATROPIUM-ALBUTEROL 0.5-2.5 (3) MG/3ML IN SOLN
3.0000 mL | Freq: Four times a day (QID) | RESPIRATORY_TRACT | Status: DC
  Administered 2023-08-20 – 2023-08-21 (×4): 3 mL via RESPIRATORY_TRACT
  Filled 2023-08-20 (×4): qty 3

## 2023-08-20 MED ORDER — IPRATROPIUM-ALBUTEROL 0.5-2.5 (3) MG/3ML IN SOLN
3.0000 mL | Freq: Four times a day (QID) | RESPIRATORY_TRACT | Status: DC
  Administered 2023-08-20: 3 mL via RESPIRATORY_TRACT
  Filled 2023-08-20: qty 3

## 2023-08-20 MED ORDER — METHYLPREDNISOLONE SODIUM SUCC 125 MG IJ SOLR
80.0000 mg | Freq: Two times a day (BID) | INTRAMUSCULAR | Status: DC
  Administered 2023-08-20: 80 mg via INTRAVENOUS
  Filled 2023-08-20: qty 2

## 2023-08-20 MED ORDER — METHYLPREDNISOLONE SODIUM SUCC 40 MG IJ SOLR
40.0000 mg | Freq: Two times a day (BID) | INTRAMUSCULAR | Status: DC
  Administered 2023-08-20 – 2023-08-21 (×2): 40 mg via INTRAVENOUS
  Filled 2023-08-20 (×2): qty 1

## 2023-08-20 MED ORDER — GUAIFENESIN ER 600 MG PO TB12
600.0000 mg | ORAL_TABLET | Freq: Two times a day (BID) | ORAL | Status: DC
  Administered 2023-08-20 – 2023-08-21 (×3): 600 mg via ORAL
  Filled 2023-08-20 (×3): qty 1

## 2023-08-20 NOTE — Assessment & Plan Note (Signed)
-   The patient will be placed on BiPAP overnight. - Will follow ABG. - Will follow mental status. - Management otherwise as below.

## 2023-08-20 NOTE — Assessment & Plan Note (Signed)
-   We will continue steroid therapy with IV Solu-Medrol . - We will continue bronchodilator therapy with DuoNebs 4 times daily and every 4 hours as needed. - Mucolytic therapy will be provided.

## 2023-08-20 NOTE — Assessment & Plan Note (Signed)
-   We will continue Imuran ?

## 2023-08-20 NOTE — Progress Notes (Signed)
 [facility]  PROGRESS NOTE  Jennifer Vasquez FMW:[id number] DOB: 05-Apr-1986 DOA: 08/19/2023 PCP: [facility]   Brief HPI:    38 y.o. Caucasian female with medical history significant for asthma, bipolar disorder, CHF, COPD, hepatitis C and ongoing tobacco abuse as well as anxiety, who presented to the emergency room with acute onset of cough with associated body aches and diaphoresis, dyspnea and occasional wheezing over the last week.  came to the ER, Pulsoxymeter was 87% on 4 L of O2 nasal cannula and later 96% on 6 L, respiratory rate was 26 with otherwise normal vital signs. Labs revealed blood gas with pH of 7.37 and pCO2 66 with pO2 of 81 with HCO3 38.2 with O2 sat of 96.4% on 6 L. CBC showed leukocytosis 19.4 with mild anemia. Lactic acid was 1.5 and later 1.8 and high sensitive troponin I was 6 and later the same. BMP revealed mild hyponatremia 133 and hypochloremia of 88, hypokalemia 3.4 and a blood glucose of 232. Calcium was 8.3  chest x-ray showed interval development of diffuse interstitial and groundglass opacities favoring multifocal atypical pneumonia such as viral or mycoplasma.    Assessment/Plan:    Acute on chronic respiratory failure with hypoxia and hypercapnia (HCC) -Was placed on BiPAP, currently weaned off -Breathing has improved -Continue oxygen therapy, wean off oxygen as tolerated    Sepsis due to pneumonia (HCC) -Started on Rocephin  and Zithromax  -Continue DuoNebs every 4 hours as needed, Mucinex  -Follow blood culture results     Multifocal pneumonia - Started on antibiotics as above   COPD with acute exacerbation (HCC) -Continue Solu-Medrol , DuoNebs 4 times daily -Continue Mucinex     Tobacco use disorder - She was counseled for smoking cessation and will receive further counseling here.   Rheumatoid arthritis (HCC) - We will continue Imuran .   Opioid dependence (HCC) - Continue Suboxone    Depression - We will continue her  antidepressants including Lexapro  and Zyprexa ..  Ecchymosis -Large ecchymosis noted in left abdominal wall; likely from coughing -Continue to monitor  Medications     azaTHIOprine   100 mg Oral Daily   [START ON 08/21/2023] buprenorphine -naloxone   1 tablet Sublingual Daily   enoxaparin  (LOVENOX ) injection  45 mg Subcutaneous Q24H   guaiFENesin   600 mg Oral BID   ipratropium-albuterol   3 mL Nebulization QID   methylPREDNISolone  (SOLU-MEDROL ) injection  80 mg Intravenous Q12H     Data Reviewed:   CBG:  No results for input(s): GLUCAP in the last 168 hours.  SpO2: 92 % O2 Flow Rate (L/min): 5 L/min    Vitals:   08/20/23 0500 08/20/23 0530 08/20/23 0600 08/20/23 0630  BP: 127/80 129/67 125/68 123/80  Pulse: 89 94 82 81  Resp: 12 (!) 21 12 13   Temp:      TempSrc:      SpO2: 90%  95% 92%      Data Reviewed:  Basic Metabolic Panel: Recent Labs  Lab 08/15/23 0743 08/19/23 1744 08/20/23 0339  NA 136 133* 134*  K 3.6 3.4* 4.0  CL 91* 88* 90*  CO2 33* 33* 35*  GLUCOSE 144* 232* 323*  BUN 11 10 9   CREATININE 0.77 0.77 0.69  CALCIUM 8.9 8.3* 8.2*    CBC: Recent Labs  Lab 08/15/23 0815 08/19/23 1744 08/20/23 0339  WBC 15.7* 19.4* 16.0*  NEUTROABS 13.9*  --   --   HGB 12.3 11.7* 10.6*  HCT 36.9 36.1 32.2*  MCV 96.1 97.3 97.6  PLT 313 326 297  LFT Recent Labs  Lab 08/15/23 0743  AST 27  ALT 25  ALKPHOS 84  BILITOT 0.6  PROT 7.8  ALBUMIN 3.6     Antibiotics: Anti-infectives (From admission, onward)    Start     Dose/Rate Route Frequency Ordered Stop   08/20/23 2000  azithromycin  (ZITHROMAX ) 500 mg in sodium chloride  0.9 % 250 mL IVPB        500 mg 250 mL/hr over 60 Minutes Intravenous Every 24 hours 08/19/23 2329 08/24/23 1959   08/20/23 0600  cefTRIAXone  (ROCEPHIN ) 2 g in sodium chloride  0.9 % 100 mL IVPB        2 g 200 mL/hr over 30 Minutes Intravenous Every 24 hours 08/19/23 2329 08/25/23 0559   08/19/23 2030  aztreonam  (AZACTAM ) 2 g in  sodium chloride  0.9 % 100 mL IVPB        2 g 200 mL/hr over 30 Minutes Intravenous  Once 08/19/23 2016 08/19/23 2119   08/19/23 2030  vancomycin  (VANCOCIN ) IVPB 1000 mg/200 mL premix        1,000 mg 200 mL/hr over 60 Minutes Intravenous  Once 08/19/23 2016 08/19/23 2218   08/19/23 2030  azithromycin  (ZITHROMAX ) tablet 500 mg        500 mg Oral  Once 08/19/23 2023 08/19/23 2035        DVT prophylaxis: Lovenox   Code Status: Full code  Family Communication: No family present at bedside   CONSULTS    Subjective   Patient seen, complains of cough.   Objective    Physical Examination:   General-appears in no acute distress Heart-S1-S2, regular, no murmur auscultated Lungs-bilateral rhonchi auscultated Abdomen-soft, nontender, no organomegaly Extremities-no edema in the lower extremities Neuro-alert, oriented x3, no focal deficit noted skin Skin-large ecchymosis noted in the left lower abdominal wall   Status is: Inpatient:             Jennifer Vasquez   [facility] If 7PM-7AM, please contact night-coverage at [email or web address], Office  [phone]   08/20/2023, 8:21 AM  LOS: 1 day

## 2023-08-20 NOTE — Assessment & Plan Note (Signed)
-   This likely the culprit for her sepsis and respiratory failure. - Management otherwise as above.

## 2023-08-20 NOTE — ED Notes (Signed)
 Pt awake. Requesting suboxone . Suboxone  given. RT at bedside to place patient on bipap

## 2023-08-20 NOTE — Assessment & Plan Note (Signed)
She was counseled for smoking cessation and will receive further counseling here. 

## 2023-08-20 NOTE — ED Notes (Addendum)
 Secretary called this RN, pt and family member stated they would like to speak with the charge nurse. Reports that primary nurse has not been in to see them and that the monitor has been going off all morning. Rosina BIRCH., primary RN was at bedside during this encounter. This RN explained that the nurse has been tied up with 2 ambulances and they stated she has been sitting at the desk also explained just because the nurse is sitting at the desk does not equate to not doing anything. Apologized to pt and family member and reassured them that we will round on them more often including the NT on the floor. Pt and family verbalized understanding at this time.

## 2023-08-20 NOTE — Assessment & Plan Note (Addendum)
-   We will continue her antidepressants including Lexapro  and Zyprexa .Jennifer Vasquez

## 2023-08-20 NOTE — Assessment & Plan Note (Addendum)
-   The patient will be admitted to a medical telemetry bed. - Will continue antibiotic therapy with IV Rocephin  and Zithromax . - Mucolytic therapy be provided as well as duo nebs q.i.d. and q.4 hours p.r.n. - We will follow blood cultures. - The patient be hydrated with IV lactated ringer . - Meet severe sepsis criteria given her worsening hypoxia.

## 2023-08-20 NOTE — Assessment & Plan Note (Signed)
 Continue Suboxone.

## 2023-08-20 NOTE — ED Notes (Signed)
 In to medicate patient per order. Pt sleeping. Family at bedside. Discussed not being able to medicate patient if she isn't able to stay awake, family agreed.

## 2023-08-21 DIAGNOSIS — J189 Pneumonia, unspecified organism: Secondary | ICD-10-CM | POA: Diagnosis not present

## 2023-08-21 DIAGNOSIS — J441 Chronic obstructive pulmonary disease with (acute) exacerbation: Secondary | ICD-10-CM | POA: Diagnosis not present

## 2023-08-21 DIAGNOSIS — S301XXA Contusion of abdominal wall, initial encounter: Secondary | ICD-10-CM

## 2023-08-21 DIAGNOSIS — J9621 Acute and chronic respiratory failure with hypoxia: Secondary | ICD-10-CM | POA: Diagnosis not present

## 2023-08-21 LAB — BASIC METABOLIC PANEL
Anion gap: 12 (ref 5–15)
BUN: 11 mg/dL (ref 6–20)
CO2: 33 mmol/L — ABNORMAL HIGH (ref 22–32)
Calcium: 8.7 mg/dL — ABNORMAL LOW (ref 8.9–10.3)
Chloride: 89 mmol/L — ABNORMAL LOW (ref 98–111)
Creatinine, Ser: 0.63 mg/dL (ref 0.44–1.00)
GFR, Estimated: >60 mL/min (ref >60)
Glucose, Bld: 212 mg/dL — ABNORMAL HIGH (ref 70–99)
Potassium: 3.9 mmol/L (ref 3.5–5.1)
Sodium: 134 mmol/L — ABNORMAL LOW (ref 135–145)

## 2023-08-21 LAB — CBC
HCT: 32.2 % — ABNORMAL LOW (ref 36.0–46.0)
Hemoglobin: 10.5 g/dL — ABNORMAL LOW (ref 12.0–15.0)
MCH: 31.8 pg (ref 26.0–34.0)
MCHC: 32.6 g/dL (ref 30.0–36.0)
MCV: 97.6 fL (ref 80.0–100.0)
Platelets: 345 10*3/uL (ref 150–400)
RBC: 3.3 MIL/uL — ABNORMAL LOW (ref 3.87–5.11)
RDW: 13.2 % (ref 11.5–15.5)
WBC: 26.8 10*3/uL — ABNORMAL HIGH (ref 4.0–10.5)
nRBC: 0 % (ref 0.0–0.2)

## 2023-08-21 LAB — CULTURE, BLOOD (ROUTINE X 2)

## 2023-08-21 MED ORDER — BENZONATATE 100 MG PO CAPS: 100.0000 mg | ORAL_CAPSULE | Freq: Three times a day (TID) | ORAL | 0 refills | Status: AC | PRN

## 2023-08-21 MED ORDER — GUAIFENESIN ER 600 MG PO TB12: 600.0000 mg | ORAL_TABLET | Freq: Two times a day (BID) | ORAL | 0 refills | Status: AC

## 2023-08-21 MED ORDER — AZITHROMYCIN 500 MG PO TABS: 500.0000 mg | ORAL_TABLET | Freq: Every day | ORAL | 0 refills | Status: AC

## 2023-08-21 MED ORDER — KETOROLAC TROMETHAMINE 30 MG/ML IJ SOLN
30.0000 mg | Freq: Once | INTRAMUSCULAR | Status: AC
  Administered 2023-08-21: 30 mg via INTRAVENOUS
  Filled 2023-08-21: qty 1

## 2023-08-21 NOTE — Plan of Care (Signed)

## 2023-08-21 NOTE — Discharge Summary (Addendum)
 Physician Discharge Summary   Patient: Jennifer Vasquez MRN: [id number] DOB: September 20, 1985  Admit date:     08/19/2023  Discharge date: 08/21/23  Discharge Physician: [name]   PCP: [facility]   Recommendations at discharge:   Follow-up PCP in 1 week  Discharge Diagnoses: Principal Problem:   Acute on chronic respiratory failure with hypoxia and hypercapnia (HCC) Active Problems:   Sepsis due to pneumonia (HCC)   Multifocal pneumonia   Tobacco use disorder   COPD with acute exacerbation (HCC)   Depression   Opioid dependence (HCC)   Rheumatoid arthritis (HCC)  Resolved Problems:   * No resolved hospital problems. [facility] Course: 38 y.o. Caucasian female with medical history significant for asthma, bipolar disorder, CHF, COPD, hepatitis C and ongoing tobacco abuse as well as anxiety, who presented to the emergency room with acute onset of cough with associated body aches and diaphoresis, dyspnea and occasional wheezing over the last week.  came to the ER, Pulsoxymeter was 87% on 4 L of O2 nasal cannula and later 96% on 6 L, respiratory rate was 26 with otherwise normal vital signs. Labs revealed blood gas with pH of 7.37 and pCO2 66 with pO2 of 81 with HCO3 38.2 with O2 sat of 96.4% on 6 L. CBC showed leukocytosis 19.4 with mild anemia. Lactic acid was 1.5 and later 1.8 and high sensitive troponin I was 6 and later the same. BMP revealed mild hyponatremia 133 and hypochloremia of 88, hypokalemia 3.4 and a blood glucose of 232. Calcium was 8.3  chest x-ray showed interval development of diffuse interstitial and groundglass opacities favoring multifocal atypical pneumonia such as viral or mycoplasma.   Assessment and Plan:   Acute on chronic respiratory failure with hypoxia and hypercapnia (HCC) -Was placed on BiPAP, currently weaned off -Breathing has improved -Continue oxygen therapy, patient is chronically on 4 L pulmonary of oxygen     Sepsis due to  pneumonia (HCC) -Sepsis physiology has resolved -Started on Rocephin  and Zithromax  -Continue DuoNebs every 4 hours as needed, Mucinex  -Blood cultures x 2 showed no growth in 2 days -Patient is afebrile, blood pressure is stable. -Has elevated WBC but that is due to the steroids given in the hospital, patient was started on Solu-Medrol  in the hospital. -Patient is also chronically on steroids which she will continue at home      Multifocal pneumonia - Started on antibiotics as above -Will discharge on Zithromax  500 mg p.o. daily for 3 more days   COPD with acute exacerbation (HCC) -Continue prednisone  l, DuoNebs -Continue Mucinex      Tobacco use disorder - She was counseled for smoking cessation    Rheumatoid arthritis (HCC) - We will continue Imuran .   Opioid dependence (HCC) - Continue Suboxone    Depression - We will continue her antidepressants including Lexapro  and Zyprexa ..   Ecchymosis -Large ecchymosis noted in left abdominal wall; likely from coughing -Improving       Consultants:  Procedures performed:  Disposition: Home Diet recommendation:  Discharge Diet Orders (From admission, onward)     Start     Ordered   08/21/23 0000  Diet - low sodium heart healthy        08/21/23 1008           Regular diet DISCHARGE MEDICATION: Allergies as of 08/21/2023       Reactions   Gabapentin Hives   Trazodone  And Nefazodone    Pt says she was told  it could stop her heart   Acetaminophen     Other reaction(s): Other (qualifier value) Due to hep c   Amoxicillin -pot Clavulanate Diarrhea, Nausea And Vomiting   Baclofen Other (See Comments)   headaches   Tetanus Toxoids    Tramadol Hives   Shellfish Allergy Rash   oysters        Medication List     STOP taking these medications    sulfamethoxazole-trimethoprim 800-160 MG tablet Commonly known as: BACTRIM DS       TAKE these medications    acetaminophen  500 MG tablet Commonly known as:  TYLENOL  Take 500-1,000 mg by mouth every 6 (six) hours as needed for mild pain or moderate pain.   albuterol  108 (90 Base) MCG/ACT inhaler Commonly known as: VENTOLIN  HFA Inhale 1-2 puffs into the lungs every 6 (six) hours as needed for wheezing or shortness of breath.   azaTHIOprine  50 MG tablet Commonly known as: IMURAN  Take 50 mg by mouth daily.   azithromycin  500 MG tablet Commonly known as: Zithromax  Take 1 tablet (500 mg total) by mouth daily for 3 days. Start taking on: August 22, 2023   benzonatate  100 MG capsule Commonly known as: Tessalon  Perles Take 1 capsule (100 mg total) by mouth 3 (three) times daily as needed for cough.   Buprenorphine  HCl-Naloxone  HCl 8-2 MG Film Place 1 Film under the tongue in the morning, at noon, and at bedtime.   cyclobenzaprine  10 MG tablet Commonly known as: FLEXERIL  Take 1 tablet by mouth 3 (three) times daily.   escitalopram  20 MG tablet Commonly known as: LEXAPRO  Take 20 mg by mouth daily.   famotidine  20 MG tablet Commonly known as: PEPCID  Take 20 mg by mouth 2 (two) times daily.   fluticasone  50 MCG/ACT nasal spray Commonly known as: FLONASE  Place 1 spray into both nostrils daily.   furosemide  40 MG tablet Commonly known as: LASIX  Take 40 mg by mouth daily.   guaiFENesin  600 MG 12 hr tablet Commonly known as: MUCINEX  Take 1 tablet (600 mg total) by mouth 2 (two) times daily.   hydrOXYzine  25 MG tablet Commonly known as: ATARAX  Take 25 mg by mouth 4 (four) times daily.   ipratropium-albuterol  0.5-2.5 (3) MG/3ML Soln Commonly known as: DUONEB Take 3 mLs by nebulization.   methotrexate  2.5 MG tablet Commonly known as: RHEUMATREX Take 20 mg by mouth once a week.   naloxone  4 MG/0.1ML Liqd nasal spray kit Commonly known as: NARCAN  Place 0.4 mg into the nose once.   nicotine  14 mg/24hr patch Commonly known as: NICODERM CQ  - dosed in mg/24 hours Place 14 mg onto the skin daily.   nortriptyline  50 MG  capsule Commonly known as: PAMELOR  Take 50 mg by mouth at bedtime.   OXYGEN Inhale 2 L into the lungs daily. continuous   predniSONE  10 MG tablet Commonly known as: DELTASONE  Take 10 mg by mouth daily with breakfast.   predniSONE  5 MG tablet Commonly known as: DELTASONE  Take 5-25 mg by mouth daily with breakfast.   pregabalin  100 MG capsule Commonly known as: LYRICA  Take 1 capsule by mouth in the morning, at noon, and at bedtime.   rizatriptan 5 MG tablet Commonly known as: MAXALT Take 1 tablet by mouth 2 (two) times daily as needed.   Spiriva  Respimat 2.5 MCG/ACT Aers Generic drug: Tiotropium Bromide  Monohydrate Inhale 1 puff into the lungs daily.   Synthroid  50 MCG tablet Generic drug: levothyroxine  Take 50 mcg by mouth daily.   Wixela Inhub  500-50 MCG/ACT Aepb Generic drug: fluticasone -salmeterol Inhale 1 puff into the lungs in the morning and at bedtime.   ZyPREXA  5 MG tablet Generic drug: OLANZapine  Take 5 mg by mouth at bedtime.        Follow-up Information     [facility] Follow up in 1 week(s).   Contact information: [address] [place] [address] [phone]                Discharge Exam: There were no vitals filed for this visit. General-appears in no acute distress Heart-S1-S2, regular, no murmur auscultated Lungs-clear to auscultation bilaterally, no wheezing or crackles auscultated Abdomen-soft, nontender, no organomegaly Extremities-no edema in the lower extremities Neuro-alert, oriented x3, no focal deficit noted  Condition at discharge: good  The results of significant diagnostics from this hospitalization (including imaging, microbiology, ancillary and laboratory) are listed below for reference.   Imaging Studies: CT ABDOMEN PELVIS W CONTRAST Result Date: 08/19/2023 CLINICAL DATA:  Shortness of breath x1 week with decreased appetite, chest pain and cough. EXAM: CT ABDOMEN AND PELVIS WITH CONTRAST  TECHNIQUE: Multidetector CT imaging of the abdomen and pelvis was performed using the standard protocol following bolus administration of intravenous contrast. RADIATION DOSE REDUCTION: This exam was performed according to the departmental dose-optimization program which includes automated exposure control, adjustment of the mA and/or kV according to patient size and/or use of iterative reconstruction technique. CONTRAST:  OMNIPAQUE  IOHEXOL  300 MG/ML  SOLN COMPARISON:  September 21, 2019 FINDINGS: Lower chest: Extensive patchy bilateral airspace disease is noted within the visualized portions of the bilateral lower lobes. Hepatobiliary: No focal liver abnormality is seen. The gallbladder is contracted. No gallstones, gallbladder wall thickening, or biliary dilatation. Pancreas: Unremarkable. No pancreatic ductal dilatation or surrounding inflammatory changes. Spleen: Normal in size without focal abnormality. Adrenals/Urinary Tract: Adrenal glands are unremarkable. Kidneys are normal, without renal calculi, focal lesion, or hydronephrosis. Bladder is unremarkable. Stomach/Bowel: Stomach is within normal limits. Appendix appears normal. No evidence of bowel wall thickening, distention, or inflammatory changes. Vascular/Lymphatic: There is moderate severity calcification of the right common iliac artery. No enlarged abdominal or pelvic lymph nodes. Reproductive: Uterus and bilateral adnexa are unremarkable. Other: No abdominal wall hernia or abnormality. No abdominopelvic ascites. Musculoskeletal: No acute or significant osseous findings. IMPRESSION: 1. Extensive patchy bilateral airspace disease within the visualized portions of the bilateral lower lobes, consistent with multifocal pneumonia. 2. No acute or active process within the abdomen or pelvis. Electronically Signed   By: Suzen Dials M.D.   On: 08/19/2023 22:07   DG Chest 2 View Result Date: 08/19/2023 CLINICAL DATA:  Chest pain, short of breath for 1  week, productive cough EXAM: CHEST - 2 VIEW COMPARISON:  08/15/2023 FINDINGS: Frontal and lateral views of the chest demonstrate an unremarkable cardiac silhouette. Since the prior exam, diffuse interstitial and ground-glass opacities have developed throughout the lungs, left greater than right. No effusion or pneumothorax. No acute bony abnormalities. IMPRESSION: 1. Interval development of diffuse interstitial and ground-glass opacities, favor multifocal atypical pneumonia such as viral or mycoplasma. Electronically Signed   By: Ozell Daring M.D.   On: 08/19/2023 18:38   DG Chest 2 View Result Date: 08/15/2023 CLINICAL DATA:  Left chest pain EXAM: CHEST - 2 VIEW COMPARISON:  02/07/2022 FINDINGS: Cardiomegaly. Diffuse interstitial opacity have prior baseline. No visible effusion or pneumothorax. Mild elevation of the right diaphragm. IMPRESSION: Cardiomegaly and chronic lung disease. No acute superimposed finding. Electronically Signed   By:  Dorn Roulette M.D.   On: 08/15/2023 08:01    Microbiology: Results for orders placed or performed during the hospital encounter of 08/19/23  Blood culture (routine x 2)     Status: None (Preliminary result)   Collection Time: 08/19/23  5:43 PM   Specimen: BLOOD RIGHT HAND  Result Value Ref Range Status   Specimen Description BLOOD RIGHT HAND  Final   Special Requests   Final    BOTTLES DRAWN AEROBIC AND ANAEROBIC Blood Culture adequate volume   Culture  Setup Time PENDING  Incomplete   Culture   Final    NO GROWTH 2 DAYS Performed at [facility], [address] [place], [place] [address]    Report Status PENDING  Incomplete  Resp panel by RT-PCR (RSV, Flu A&B, Covid) Anterior Nasal Swab     Status: None   Collection Time: 08/19/23  5:44 PM   Specimen: Anterior Nasal Swab  Result Value Ref Range Status   SARS Coronavirus 2 by RT PCR NEGATIVE NEGATIVE Final    Comment: (NOTE) SARS-CoV-2 target nucleic acids are NOT DETECTED.  The  SARS-CoV-2 RNA is generally detectable in upper respiratory specimens during the acute phase of infection. The lowest concentration of SARS-CoV-2 viral copies this assay can detect is 138 copies/mL. A negative result does not preclude SARS-Cov-2 infection and should not be used as the sole basis for treatment or other patient management decisions. A negative result may occur with  improper specimen collection/handling, submission of specimen other than nasopharyngeal swab, presence of viral mutation(s) within the areas targeted by this assay, and inadequate number of viral copies(<138 copies/mL). A negative result must be combined with clinical observations, patient history, and epidemiological information. The expected result is Negative.  Fact Sheet for Patients:  bloggercourse.com  Fact Sheet for Healthcare Providers:  seriousbroker.it  This test is no t yet approved or cleared by the [place]  FDA and  has been authorized for detection and/or diagnosis of SARS-CoV-2 by FDA under an Emergency Use Authorization (EUA). This EUA will remain  in effect (meaning this test can be used) for the duration of the COVID-19 declaration under Section 564(b)(1) of the Act, 21 U.S.C.section 360bbb-3(b)(1), unless the authorization is terminated  or revoked sooner.       Influenza A by PCR NEGATIVE NEGATIVE Final   Influenza B by PCR NEGATIVE NEGATIVE Final    Comment: (NOTE) The Xpert Xpress SARS-CoV-2/FLU/RSV plus assay is intended as an aid in the diagnosis of influenza from Nasopharyngeal swab specimens and should not be used as a sole basis for treatment. Nasal washings and aspirates are unacceptable for Xpert Xpress SARS-CoV-2/FLU/RSV testing.  Fact Sheet for Patients: bloggercourse.com  Fact Sheet for Healthcare Providers: seriousbroker.it  This test is not yet approved or  cleared by the [place]  FDA and has been authorized for detection and/or diagnosis of SARS-CoV-2 by FDA under an Emergency Use Authorization (EUA). This EUA will remain in effect (meaning this test can be used) for the duration of the COVID-19 declaration under Section 564(b)(1) of the Act, 21 U.S.C. section 360bbb-3(b)(1), unless the authorization is terminated or revoked.     Resp Syncytial Virus by PCR NEGATIVE NEGATIVE Final    Comment: (NOTE) Fact Sheet for Patients: bloggercourse.com  Fact Sheet for Healthcare Providers: seriousbroker.it  This test is not yet approved or cleared by the [place]  FDA and has been authorized for detection and/or diagnosis of SARS-CoV-2 by FDA under an Emergency Use Authorization (EUA). This  EUA will remain in effect (meaning this test can be used) for the duration of the COVID-19 declaration under Section 564(b)(1) of the Act, 21 U.S.C. section 360bbb-3(b)(1), unless the authorization is terminated or revoked.  Performed at [facility], [address] [place], [place] [address]   Blood culture (routine x 2)     Status: None (Preliminary result)   Collection Time: 08/19/23  7:42 PM   Specimen: BLOOD RIGHT ARM  Result Value Ref Range Status   Specimen Description BLOOD RIGHT ARM  Final   Special Requests   Final    BOTTLES DRAWN AEROBIC AND ANAEROBIC Blood Culture results may not be optimal due to an inadequate volume of blood received in culture bottles   Culture   Final    NO GROWTH 2 DAYS Performed at [facility], [address] [place], [place] [address]    Report Status PENDING  Incomplete    Labs: CBC: Recent Labs  Lab 08/15/23 0815 08/19/23 1744 08/20/23 0339 08/21/23 0820  WBC 15.7* 19.4* 16.0* 26.8*  NEUTROABS 13.9*  --   --   --   HGB 12.3 11.7* 10.6* 10.5*  HCT 36.9 36.1 32.2* 32.2*  MCV 96.1 97.3 97.6 97.6  PLT 313 326 297 345    Basic Metabolic Panel: Recent Labs  Lab 08/15/23 0743 08/19/23 1744 08/20/23 0339 08/21/23 0820  NA 136 133* 134* 134*  K 3.6 3.4* 4.0 3.9  CL 91* 88* 90* 89*  CO2 33* 33* 35* 33*  GLUCOSE 144* 232* 323* 212*  BUN 11 10 9 11   CREATININE 0.77 0.77 0.69 0.63  CALCIUM 8.9 8.3* 8.2* 8.7*   Liver Function Tests: Recent Labs  Lab 08/15/23 0743  AST 27  ALT 25  ALKPHOS 84  BILITOT 0.6  PROT 7.8  ALBUMIN 3.6   CBG: No results for input(s): GLUCAP in the last 168 hours.  Discharge time spent: greater than 30 minutes.  Signed: [name], MD [facility] 08/21/2023

## 2023-08-23 ENCOUNTER — Inpatient Hospital Stay: Payer: Medicaid Other

## 2023-08-23 ENCOUNTER — Other Ambulatory Visit: Payer: Self-pay

## 2023-08-23 ENCOUNTER — Emergency Department: Payer: Medicaid Other

## 2023-08-23 ENCOUNTER — Observation Stay
Admission: EM | Admit: 2023-08-23 | Discharge: 2023-08-23 | DRG: 189 | Discharge status: Against Medical Advice | Payer: Medicaid Other | Attending: Internal Medicine | Admitting: Internal Medicine

## 2023-08-23 DIAGNOSIS — M069 Rheumatoid arthritis, unspecified: Secondary | ICD-10-CM | POA: Diagnosis not present

## 2023-08-23 DIAGNOSIS — Z79890 Hormone replacement therapy (postmenopausal): Secondary | ICD-10-CM | POA: Diagnosis not present

## 2023-08-23 DIAGNOSIS — Z818 Family history of other mental and behavioral disorders: Secondary | ICD-10-CM

## 2023-08-23 DIAGNOSIS — F17210 Nicotine dependence, cigarettes, uncomplicated: Secondary | ICD-10-CM | POA: Diagnosis present

## 2023-08-23 DIAGNOSIS — Z8249 Family history of ischemic heart disease and other diseases of the circulatory system: Secondary | ICD-10-CM

## 2023-08-23 DIAGNOSIS — R000 Tachycardia, unspecified: Secondary | ICD-10-CM | POA: Diagnosis not present

## 2023-08-23 DIAGNOSIS — Z83438 Family history of other disorder of lipoprotein metabolism and other lipidemia: Secondary | ICD-10-CM | POA: Diagnosis not present

## 2023-08-23 DIAGNOSIS — Z811 Family history of alcohol abuse and dependence: Secondary | ICD-10-CM

## 2023-08-23 DIAGNOSIS — Z1152 Encounter for screening for COVID-19: Secondary | ICD-10-CM | POA: Diagnosis not present

## 2023-08-23 DIAGNOSIS — Z79631 Long term (current) use of antimetabolite agent: Secondary | ICD-10-CM | POA: Insufficient documentation

## 2023-08-23 DIAGNOSIS — Z9981 Dependence on supplemental oxygen: Secondary | ICD-10-CM | POA: Diagnosis not present

## 2023-08-23 DIAGNOSIS — Z8619 Personal history of other infectious and parasitic diseases: Secondary | ICD-10-CM | POA: Insufficient documentation

## 2023-08-23 DIAGNOSIS — M5030 Other cervical disc degeneration, unspecified cervical region: Secondary | ICD-10-CM | POA: Insufficient documentation

## 2023-08-23 DIAGNOSIS — Z833 Family history of diabetes mellitus: Secondary | ICD-10-CM

## 2023-08-23 DIAGNOSIS — I509 Heart failure, unspecified: Secondary | ICD-10-CM | POA: Diagnosis present

## 2023-08-23 DIAGNOSIS — J9622 Acute and chronic respiratory failure with hypercapnia: Secondary | ICD-10-CM | POA: Diagnosis present

## 2023-08-23 DIAGNOSIS — Z841 Family history of disorders of kidney and ureter: Secondary | ICD-10-CM

## 2023-08-23 DIAGNOSIS — E669 Obesity, unspecified: Secondary | ICD-10-CM | POA: Diagnosis present

## 2023-08-23 DIAGNOSIS — J440 Chronic obstructive pulmonary disease with acute lower respiratory infection: Secondary | ICD-10-CM | POA: Diagnosis present

## 2023-08-23 DIAGNOSIS — Z7952 Long term (current) use of systemic steroids: Secondary | ICD-10-CM | POA: Diagnosis not present

## 2023-08-23 DIAGNOSIS — J9601 Acute respiratory failure with hypoxia: Secondary | ICD-10-CM | POA: Diagnosis not present

## 2023-08-23 DIAGNOSIS — F1120 Opioid dependence, uncomplicated: Secondary | ICD-10-CM | POA: Insufficient documentation

## 2023-08-23 DIAGNOSIS — F319 Bipolar disorder, unspecified: Secondary | ICD-10-CM | POA: Diagnosis present

## 2023-08-23 DIAGNOSIS — Z79899 Other long term (current) drug therapy: Secondary | ICD-10-CM | POA: Diagnosis not present

## 2023-08-23 DIAGNOSIS — J441 Chronic obstructive pulmonary disease with (acute) exacerbation: Secondary | ICD-10-CM

## 2023-08-23 DIAGNOSIS — Z91013 Allergy to seafood: Secondary | ICD-10-CM | POA: Diagnosis not present

## 2023-08-23 DIAGNOSIS — Z8261 Family history of arthritis: Secondary | ICD-10-CM

## 2023-08-23 DIAGNOSIS — Z7951 Long term (current) use of inhaled steroids: Secondary | ICD-10-CM

## 2023-08-23 DIAGNOSIS — Z881 Allergy status to other antibiotic agents status: Secondary | ICD-10-CM

## 2023-08-23 DIAGNOSIS — F419 Anxiety disorder, unspecified: Secondary | ICD-10-CM | POA: Insufficient documentation

## 2023-08-23 DIAGNOSIS — R079 Chest pain, unspecified: Secondary | ICD-10-CM | POA: Diagnosis present

## 2023-08-23 DIAGNOSIS — Z825 Family history of asthma and other chronic lower respiratory diseases: Secondary | ICD-10-CM | POA: Diagnosis not present

## 2023-08-23 DIAGNOSIS — Z888 Allergy status to other drugs, medicaments and biological substances status: Secondary | ICD-10-CM

## 2023-08-23 DIAGNOSIS — J449 Chronic obstructive pulmonary disease, unspecified: Secondary | ICD-10-CM | POA: Diagnosis not present

## 2023-08-23 DIAGNOSIS — B1920 Unspecified viral hepatitis C without hepatic coma: Secondary | ICD-10-CM | POA: Diagnosis present

## 2023-08-23 DIAGNOSIS — M51369 Other intervertebral disc degeneration, lumbar region without mention of lumbar back pain or lower extremity pain: Secondary | ICD-10-CM | POA: Insufficient documentation

## 2023-08-23 DIAGNOSIS — Z821 Family history of blindness and visual loss: Secondary | ICD-10-CM

## 2023-08-23 DIAGNOSIS — Z886 Allergy status to analgesic agent status: Secondary | ICD-10-CM

## 2023-08-23 DIAGNOSIS — Z5329 Procedure and treatment not carried out because of patient's decision for other reasons: Secondary | ICD-10-CM | POA: Diagnosis present

## 2023-08-23 DIAGNOSIS — Z885 Allergy status to narcotic agent status: Secondary | ICD-10-CM

## 2023-08-23 DIAGNOSIS — Z6835 Body mass index (BMI) 35.0-35.9, adult: Secondary | ICD-10-CM

## 2023-08-23 DIAGNOSIS — R0689 Other abnormalities of breathing: Secondary | ICD-10-CM

## 2023-08-23 DIAGNOSIS — J9621 Acute and chronic respiratory failure with hypoxia: Secondary | ICD-10-CM | POA: Diagnosis present

## 2023-08-23 DIAGNOSIS — Z809 Family history of malignant neoplasm, unspecified: Secondary | ICD-10-CM

## 2023-08-23 DIAGNOSIS — Z887 Allergy status to serum and vaccine status: Secondary | ICD-10-CM

## 2023-08-23 DIAGNOSIS — S2242XA Multiple fractures of ribs, left side, initial encounter for closed fracture: Secondary | ICD-10-CM | POA: Diagnosis present

## 2023-08-23 DIAGNOSIS — Z79624 Long term (current) use of inhibitors of nucleotide synthesis: Secondary | ICD-10-CM | POA: Diagnosis not present

## 2023-08-23 DIAGNOSIS — Z981 Arthrodesis status: Secondary | ICD-10-CM

## 2023-08-23 DIAGNOSIS — J189 Pneumonia, unspecified organism: Secondary | ICD-10-CM

## 2023-08-23 DIAGNOSIS — J9602 Acute respiratory failure with hypercapnia: Principal | ICD-10-CM

## 2023-08-23 DIAGNOSIS — R0603 Acute respiratory distress: Secondary | ICD-10-CM | POA: Diagnosis present

## 2023-08-23 LAB — PROCALCITONIN: Procalcitonin: 0.47 ng/mL

## 2023-08-23 LAB — BLOOD GAS, ARTERIAL
Acid-Base Excess: 15 mmol/L — ABNORMAL HIGH (ref 0.0–2.0)
Acid-Base Excess: 12.4 mmol/L — ABNORMAL HIGH (ref 0.0–2.0)
Bicarbonate: 43.1 mmol/L — ABNORMAL HIGH (ref 20.0–28.0)
Bicarbonate: 39.6 mmol/L — ABNORMAL HIGH (ref 20.0–28.0)
Delivery systems: POSITIVE
Delivery systems: POSITIVE
Expiratory PAP: 5 cm[H2O]
Expiratory PAP: 5 cm[H2O]
FIO2: 40 %
FIO2: 40 %
Inspiratory PAP: 10 cm[H2O]
Inspiratory PAP: 10 cm[H2O]
O2 Saturation: 98.6 %
O2 Saturation: 98 %
Patient temperature: 37
Patient temperature: 37
pCO2 arterial: 68 mm[Hg] (ref 32–48)
pCO2 arterial: 61 mm[Hg] — ABNORMAL HIGH (ref 32–48)
pH, Arterial: 7.41 (ref 7.35–7.45)
pH, Arterial: 7.42 (ref 7.35–7.45)
pO2, Arterial: 121 mm[Hg] — ABNORMAL HIGH (ref 83–108)
pO2, Arterial: 116 mm[Hg] — ABNORMAL HIGH (ref 83–108)

## 2023-08-23 LAB — CBC
HCT: 33.6 % — ABNORMAL LOW (ref 36.0–46.0)
Hemoglobin: 10.5 g/dL — ABNORMAL LOW (ref 12.0–15.0)
MCH: 31.2 pg (ref 26.0–34.0)
MCHC: 31.3 g/dL (ref 30.0–36.0)
MCV: 99.7 fL (ref 80.0–100.0)
Platelets: 390 10*3/uL (ref 150–400)
RBC: 3.37 MIL/uL — ABNORMAL LOW (ref 3.87–5.11)
RDW: 13.5 % (ref 11.5–15.5)
WBC: 20.3 10*3/uL — ABNORMAL HIGH (ref 4.0–10.5)
nRBC: 0 % (ref 0.0–0.2)

## 2023-08-23 LAB — RESP PANEL BY RT-PCR (RSV, FLU A&B, COVID)  RVPGX2
Influenza A by PCR: NEGATIVE
Influenza B by PCR: NEGATIVE
Resp Syncytial Virus by PCR: NEGATIVE
SARS Coronavirus 2 by RT PCR: NEGATIVE

## 2023-08-23 LAB — BASIC METABOLIC PANEL
Anion gap: 14 (ref 5–15)
BUN: 17 mg/dL (ref 6–20)
CO2: 33 mmol/L — ABNORMAL HIGH (ref 22–32)
Calcium: 8.6 mg/dL — ABNORMAL LOW (ref 8.9–10.3)
Chloride: 91 mmol/L — ABNORMAL LOW (ref 98–111)
Creatinine, Ser: 0.76 mg/dL (ref 0.44–1.00)
GFR, Estimated: >60 mL/min (ref >60)
Glucose, Bld: 99 mg/dL (ref 70–99)
Potassium: 4.4 mmol/L (ref 3.5–5.1)
Sodium: 138 mmol/L (ref 135–145)

## 2023-08-23 LAB — LACTIC ACID, PLASMA
Lactic Acid, Venous: 1.2 mmol/L (ref 0.5–1.9)
Lactic Acid, Venous: 0.8 mmol/L (ref 0.5–1.9)

## 2023-08-23 LAB — TROPONIN I (HIGH SENSITIVITY)
Troponin I (High Sensitivity): 8 ng/L (ref <18)
Troponin I (High Sensitivity): 7 ng/L (ref <18)

## 2023-08-23 LAB — BRAIN NATRIURETIC PEPTIDE: B Natriuretic Peptide: 117.5 pg/mL — ABNORMAL HIGH (ref 0.0–100.0)

## 2023-08-23 MED ORDER — FUROSEMIDE 40 MG PO TABS: 40.0000 mg | ORAL_TABLET | Freq: Every day | ORAL | Status: DC

## 2023-08-23 MED ORDER — METHYLPREDNISOLONE SODIUM SUCC 125 MG IJ SOLR
125.0000 mg | Freq: Once | INTRAMUSCULAR | Status: AC
  Administered 2023-08-23: 125 mg via INTRAVENOUS
  Filled 2023-08-23: qty 2

## 2023-08-23 MED ORDER — SODIUM CHLORIDE 0.9 % IV BOLUS
500.0000 mL | Freq: Once | INTRAVENOUS | Status: AC
  Administered 2023-08-23: 500 mL via INTRAVENOUS

## 2023-08-23 MED ORDER — SODIUM CHLORIDE 0.9 % IV SOLN
1.0000 g | Freq: Once | INTRAVENOUS | Status: AC
  Administered 2023-08-23: 1 g via INTRAVENOUS
  Filled 2023-08-23: qty 10

## 2023-08-23 MED ORDER — BUPRENORPHINE HCL-NALOXONE HCL 8-2 MG SL SUBL: 1.0000 | SUBLINGUAL_TABLET | Freq: Every day | SUBLINGUAL | Status: DC

## 2023-08-23 MED ORDER — ONDANSETRON HCL 4 MG PO TABS: 4.0000 mg | ORAL_TABLET | Freq: Four times a day (QID) | ORAL | Status: DC | PRN

## 2023-08-23 MED ORDER — NORTRIPTYLINE HCL 25 MG PO CAPS: 50.0000 mg | ORAL_CAPSULE | Freq: Every day | ORAL | Status: DC

## 2023-08-23 MED ORDER — ONDANSETRON HCL 4 MG/2ML IJ SOLN: 4.0000 mg | Freq: Four times a day (QID) | INTRAMUSCULAR | Status: DC | PRN

## 2023-08-23 MED ORDER — AZATHIOPRINE 50 MG PO TABS
50.0000 mg | ORAL_TABLET | Freq: Every day | ORAL | Status: DC
  Filled 2023-08-23: qty 1

## 2023-08-23 MED ORDER — ENOXAPARIN SODIUM 40 MG/0.4ML IJ SOSY: 40.0000 mg | PREFILLED_SYRINGE | INTRAMUSCULAR | Status: DC

## 2023-08-23 MED ORDER — IOHEXOL 350 MG/ML SOLN
75.0000 mL | Freq: Once | INTRAVENOUS | Status: AC | PRN
  Administered 2023-08-23: 75 mL via INTRAVENOUS

## 2023-08-23 MED ORDER — FLUTICASONE FUROATE-VILANTEROL 200-25 MCG/ACT IN AEPB
1.0000 | INHALATION_SPRAY | Freq: Every day | RESPIRATORY_TRACT | Status: DC
  Filled 2023-08-23: qty 28

## 2023-08-23 MED ORDER — ACETAMINOPHEN 325 MG PO TABS: 650.0000 mg | ORAL_TABLET | Freq: Four times a day (QID) | ORAL | Status: DC | PRN

## 2023-08-23 MED ORDER — PREGABALIN 50 MG PO CAPS: 100.0000 mg | ORAL_CAPSULE | Freq: Three times a day (TID) | ORAL | Status: DC

## 2023-08-23 MED ORDER — BENZONATATE 100 MG PO CAPS: 100.0000 mg | ORAL_CAPSULE | Freq: Three times a day (TID) | ORAL | Status: DC | PRN

## 2023-08-23 MED ORDER — LEVOTHYROXINE SODIUM 50 MCG PO TABS: 50.0000 ug | ORAL_TABLET | Freq: Every day | ORAL | Status: DC

## 2023-08-23 MED ORDER — FAMOTIDINE 20 MG PO TABS: 20.0000 mg | ORAL_TABLET | Freq: Two times a day (BID) | ORAL | Status: DC

## 2023-08-23 MED ORDER — ACETAMINOPHEN 325 MG RE SUPP: 650.0000 mg | Freq: Four times a day (QID) | RECTAL | Status: DC | PRN

## 2023-08-23 MED ORDER — GUAIFENESIN ER 600 MG PO TB12: 600.0000 mg | ORAL_TABLET | Freq: Two times a day (BID) | ORAL | Status: DC

## 2023-08-23 MED ORDER — CYCLOBENZAPRINE HCL 10 MG PO TABS: 10.0000 mg | ORAL_TABLET | Freq: Three times a day (TID) | ORAL | Status: DC

## 2023-08-23 MED ORDER — SODIUM CHLORIDE 0.9 % IV SOLN
500.0000 mg | Freq: Once | INTRAVENOUS | Status: AC
  Administered 2023-08-23: 500 mg via INTRAVENOUS
  Filled 2023-08-23: qty 5

## 2023-08-23 MED ORDER — POLYETHYLENE GLYCOL 3350 17 G PO PACK: 17.0000 g | PACK | Freq: Every day | ORAL | Status: DC | PRN

## 2023-08-23 MED ORDER — OLANZAPINE 5 MG PO TABS: 5.0000 mg | ORAL_TABLET | Freq: Every day | ORAL | Status: DC

## 2023-08-23 MED ORDER — FLUTICASONE PROPIONATE 50 MCG/ACT NA SUSP
1.0000 | Freq: Every day | NASAL | Status: DC
  Filled 2023-08-23: qty 16

## 2023-08-23 MED ORDER — ESCITALOPRAM OXALATE 10 MG PO TABS: 20.0000 mg | ORAL_TABLET | Freq: Every day | ORAL | Status: DC

## 2023-08-23 MED ORDER — IPRATROPIUM-ALBUTEROL 0.5-2.5 (3) MG/3ML IN SOLN: 3.0000 mL | RESPIRATORY_TRACT | Status: DC

## 2023-08-23 MED ORDER — IPRATROPIUM-ALBUTEROL 0.5-2.5 (3) MG/3ML IN SOLN
3.0000 mL | Freq: Once | RESPIRATORY_TRACT | Status: AC
  Administered 2023-08-23: 3 mL via RESPIRATORY_TRACT
  Filled 2023-08-23: qty 3

## 2023-08-23 NOTE — Consult Note (Signed)
Initial Consultation Note   Patient: Jennifer Vasquez NFA:[id number] DOB: [birth year] PCP: [facility] DOA: 08/23/2023 DOS: the patient was seen and examined on 08/23/2023 Primary service: No att. providers found  Referring physician: Chiquita Loth MD Reason for consult: Admitted for acute on chronic hypoxic and hypercapnic respiratory failure  Patient later left AMA without completing evaluation and further management.  Assessment and Plan: Acute on chronic hypoxic and hypercapnic respiratory failure.  Patient was recently admitted for similar reasons and treated for pneumonia with Rocephin and Zithromax, discharged home on steroid and Zithromax and came back after 1 day with worsening shortness of breath.  ABG with concern of hypoxia and hypercapnia, requiring initially 6 L of oxygen followed by his BiPAP.  CTA was negative for PE but did show persistent bilateral groundglass opacities.  Procalcitonin now positive at 0.47 and BNP mildly elevated at 117 as compared to 33 few days ago. CTA also shows acute to subacute displaced left eighth and ninth rib fractures.  She was having significant left-sided and lower abdominal wall ecchymosis.  Significant tenderness on the left flank area.  Hemoglobin decreased from baseline but currently stable as compared to the most recent check 3 days ago.  Patient might have some bleeding with those fracture. No history of fall.  She has rheumatoid arthritis and on chronic steroid.  CTA findings were not discussed with patient as CT was obtained this morning.  Her breathing status improved after weaning from BiPAP and she left AMA.  Patient did not completed further evaluation and management.  Did receive 1 dose of ceftriaxone and Zithromax.  Patient will remain high risk for readmission.   HPI: Jennifer Vasquez is a 38 y.o. female with past medical history of asthma, bipolar disorder, CHF, COPD, hepatitis C and ongoing tobacco abuse as well as  anxiety, who presented to the emergency room with worsening shortness of breath and some associated chest pain, increased with coughing.  Patient with recent admission from 2/7 - 2/9, with similar presentation when she was treated for pneumonia and COPD exacerbation.  Discharged home on Zithromax and steroid.  Patient continued to have significant left lateral abdominal wall pain with healing ecchymosis.  Still having cough. Patient initially wants to leave and later agreed to stay to complete evaluation.  She later decided to leave AMA.  Review of Systems: As mentioned in the history of present illness. All other systems reviewed and are negative. Past Medical History:  Diagnosis Date   Acute carpal tunnel syndrome 10/10/2014   Anxiety state 09/03/2009   Asthma    Bipolar disorder (HCC) 02/09/2013   CHF (congestive heart failure) (HCC)    COPD (chronic obstructive pulmonary disease) (HCC)    DDD (degenerative disc disease), cervical 11/13/2014   DDD (degenerative disc disease), lumbar 11/13/2014   Hepatitis C    Lumbosacral radiculopathy 11/13/2014   Tobacco use disorder 09/06/2012   Past Surgical History:  Procedure Laterality Date   CERVICAL DISC ARTHROPLASTY     TONSILLECTOMY     TUBAL LIGATION     Social History:  reports that she has been smoking cigarettes. She has never used smokeless tobacco. She reports that she does not currently use alcohol. She reports that she does not use drugs.  Allergies  Allergen Reactions   Gabapentin Hives   Trazodone And Nefazodone     Pt says she was told it could stop her heart   Acetaminophen     Other reaction(s): Other (qualifier value)  Due to hep c   Amoxicillin-Pot Clavulanate Diarrhea and Nausea And Vomiting   Baclofen Other (See Comments)    headaches   Tetanus Toxoids    Tramadol Hives   Shellfish Allergy Rash    oysters    Family History  Problem Relation Age of Onset   Arthritis Mother    Asthma Mother    Diabetes Mother     Hyperlipidemia Mother    Vision loss Mother    Alcohol abuse Father    Asthma Father    Diabetes Father    Heart disease Father    Depression Father    COPD Father    Cancer Father    Hypertension Father    Kidney disease Father    Vision loss Father     Prior to Admission medications   Medication Sig Start Date End Date Taking? Authorizing Provider  acetaminophen (TYLENOL) 500 MG tablet Take 500-1,000 mg by mouth every 6 (six) hours as needed for mild pain or moderate pain.   Yes [provider]  albuterol (VENTOLIN HFA) 108 (90 Base) MCG/ACT inhaler Inhale 1-2 puffs into the lungs every 6 (six) hours as needed for wheezing or shortness of breath.   Yes [provider]  azaTHIOprine (IMURAN) 50 MG tablet Take 50 mg by mouth daily.   Yes [provider]  azithromycin (ZITHROMAX) 500 MG tablet Take 1 tablet (500 mg total) by mouth daily for 3 days. 08/22/23 08/25/23 Yes Lama, Sarina Ill, MD  benzonatate (TESSALON PERLES) 100 MG capsule Take 1 capsule (100 mg total) by mouth 3 (three) times daily as needed for cough. 08/21/23 08/20/24 Yes Meredeth Ide, MD  Buprenorphine HCl-Naloxone HCl 8-2 MG FILM Place 1 Film under the tongue in the morning, at noon, and at bedtime.   Yes [provider]  cyclobenzaprine (FLEXERIL) 10 MG tablet Take 1 tablet by mouth 3 (three) times daily. 03/03/20  Yes [provider]  escitalopram (LEXAPRO) 20 MG tablet Take 20 mg by mouth daily.   Yes [provider]  famotidine (PEPCID) 20 MG tablet Take 20 mg by mouth 2 (two) times daily.   Yes [provider]  fluticasone (FLONASE) 50 MCG/ACT nasal spray Place 1 spray into both nostrils daily.   Yes [provider]  fluticasone-salmeterol (WIXELA INHUB) 500-50 MCG/ACT AEPB Inhale 1 puff into the lungs in the morning and at bedtime.   Yes [provider]  furosemide (LASIX) 40 MG tablet Take 40 mg by mouth daily.   Yes [provider]   guaiFENesin (MUCINEX) 600 MG 12 hr tablet Take 1 tablet (600 mg total) by mouth 2 (two) times daily. 08/21/23  Yes Meredeth Ide, MD  hydrOXYzine (ATARAX) 25 MG tablet Take 25 mg by mouth 4 (four) times daily. 06/06/23  Yes [provider]  ipratropium-albuterol (DUONEB) 0.5-2.5 (3) MG/3ML SOLN Take 3 mLs by nebulization.   Yes [provider]  naloxone (NARCAN) nasal spray 4 mg/0.1 mL Place 0.4 mg into the nose once. 04/26/23  Yes [provider]  nortriptyline (PAMELOR) 50 MG capsule Take 50 mg by mouth at bedtime.   Yes [provider]  OLANZapine (ZYPREXA) 5 MG tablet Take 5 mg by mouth at bedtime.   Yes [provider]  predniSONE (DELTASONE) 5 MG tablet Take 5-25 mg by mouth daily with breakfast. 08/08/23  Yes [provider]  pregabalin (LYRICA) 100 MG capsule Take 1 capsule by mouth in the morning, at noon, and at  bedtime. 03/03/20  Yes [provider]  SPIRIVA RESPIMAT 2.5 MCG/ACT AERS Inhale 1 puff into the lungs daily. 06/24/23  Yes [provider]  SYNTHROID 50 MCG tablet Take 50 mcg by mouth daily. 07/28/23  Yes [provider]  methotrexate (RHEUMATREX) 2.5 MG tablet Take 20 mg by mouth once a week. Patient not taking: Reported on 02/08/2022 07/11/19   [provider]  nicotine (NICODERM CQ - DOSED IN MG/24 HOURS) 14 mg/24hr patch Place 14 mg onto the skin daily. Patient not taking: Reported on 08/20/2023    [provider]  nortriptyline (PAMELOR) 10 MG capsule Take 10 mg by mouth 2 (two) times daily. Patient not taking: Reported on 08/23/2023 07/28/23   [provider]  OXYGEN Inhale 2 L into the lungs daily. continuous    [provider]  predniSONE (DELTASONE) 10 MG tablet Take 10 mg by mouth daily with breakfast. Patient not taking: Reported on 08/23/2023    [provider]  rizatriptan (MAXALT) 5 MG tablet Take 1 tablet by mouth 2 (two) times daily as  needed. Patient not taking: Reported on 02/08/2022 01/18/20   [provider]    Physical Exam: Vitals:   08/23/23 0700 08/23/23 0730 08/23/23 0800 08/23/23 0839  BP: 126/74 125/75 118/68   Pulse: 94 92 90   Resp: 10 12 13    Temp:      TempSrc:      SpO2: 99% 98% 94% 96%  Weight:      Height:       General.  Obese lady, in no acute distress. Pulmonary.  Harsh breath sounds bilaterally, normal respiratory effort. CV.  Regular rate and rhythm, no JVD, rub or murmur. Abdomen.  Soft, nontender, nondistended, BS positive.  Ecchymosis involving lower abdomen and left lateral abdominal wall. CNS.  Alert and oriented .  No focal neurologic deficit. Extremities.  No edema, no cyanosis, pulses intact and symmetrical. Psychiatry.  Judgment and insight appears normal.   Data Reviewed:  Prior data reviewed  Family Communication: No family at bedside Primary team communication: Patient left AMA Thank you very much for involving [place] in the care of your patient.  Author: Arnetha Courser, MD 08/23/2023 12:45 PM  For on call review [email or web address].

## 2023-08-23 NOTE — ED Notes (Signed)
Pt remains on bi-pap.

## 2023-08-23 NOTE — ED Notes (Signed)
RESPRITORY CALLED FOR BIPAP AND BLOOD GAS

## 2023-08-23 NOTE — ED Notes (Signed)
Bed fall alarm active. Fall socks and bracelet in place at this time.

## 2023-08-23 NOTE — ED Triage Notes (Signed)
Pt to ED via EMS from home, pt c/o chest pain x3 days. Pt was seen here yesterday and dx with pneumonia. Pt states she has had some vomiting as well. Pt is on 4L nasal cannula at baseline.

## 2023-08-23 NOTE — ED Notes (Signed)
Discussed with MD pt desire to leave AMA. MD said she is aware and spoke with the patient. Patient signed AMA form after being made aware of the risks of leaving. This RN witnessed.

## 2023-08-23 NOTE — ED Notes (Signed)
Pt sleeping, respirations even and unlabored with bi-pap in place.

## 2023-08-23 NOTE — ED Notes (Signed)
Upon entering room, pt self-removed bipap mask and is asking for crackers and peanut butter. SpO2 dipped below 90%, and patient placed on HFNC at 8lpm. Pt tolerating this modality at this time. Crackers and peanut butter and drink given to patient. Will closely monitor oxygen levels.

## 2023-08-23 NOTE — ED Notes (Signed)
Pt remains on Bipap

## 2023-08-23 NOTE — ED Notes (Signed)
Patient transported to CT

## 2023-08-23 NOTE — ED Notes (Signed)
Pt provided pillow for comfort and support

## 2023-08-23 NOTE — ED Provider Notes (Signed)
[facility] Provider Note    Event Date/Time   First MD Initiated Contact with Patient 08/23/23 [phone]     (approximate)   History   Chest Pain   HPI  Jennifer Vasquez is a 38 y.o. female brought to the ED via EMS from home with a chief complaint of respiratory distress and chest pain.  Patient with a history of COPD, CHF on baseline 4 L nasal cannula oxygen.  Recent hospitalization for multifocal pneumonia, discharged less than 48 hours ago.  Returns for worsening shortness of breath.  Denies fever/chills, abdominal pain, nausea, vomiting or dizziness.     Past Medical History   Past Medical History:  Diagnosis Date   Acute carpal tunnel syndrome 10/10/2014   Anxiety state 09/03/2009   Asthma    Bipolar disorder (HCC) 02/09/2013   CHF (congestive heart failure) (HCC)    COPD (chronic obstructive pulmonary disease) (HCC)    DDD (degenerative disc disease), cervical 11/13/2014   DDD (degenerative disc disease), lumbar 11/13/2014   Hepatitis C    Lumbosacral radiculopathy 11/13/2014   Tobacco use disorder 09/06/2012     Active Problem List   Patient Active Problem List   Diagnosis Date Noted   Acute respiratory failure with hypoxia and hypercapnia (HCC) 08/23/2023   Acute on chronic respiratory failure with hypoxia and hypercapnia (HCC) 08/20/2023   Opioid dependence (HCC) 08/20/2023   Rheumatoid arthritis (HCC) 08/20/2023   COPD with acute exacerbation (HCC) 08/20/2023   COPD exacerbation (HCC) 02/08/2022   Acute respiratory failure with hypoxia (HCC) 02/08/2022   Polysubstance abuse (HCC) 02/08/2022   Opiate withdrawal (HCC) 02/08/2022   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   Multifocal pneumonia 09/21/2019   Sepsis due to pneumonia (HCC) 07/05/2018   Community acquired pneumonia of left lung    Respiratory failure (HCC) 11/05/2016   DDD (degenerative disc disease), lumbar 11/13/2014   DDD (degenerative disc disease), cervical  11/13/2014   Lumbosacral radiculopathy 11/13/2014   Acute carpal tunnel syndrome 10/10/2014   Chronic pain 03/02/2013   Depression 03/02/2013   Bipolar 1 disorder (HCC) 02/09/2013   Hepatitis C 12/18/2012   Asthma 11/23/2012   Neck pain 11/23/2012   Torticollis 11/23/2012   Tobacco use disorder 09/06/2012   Anxiety state 09/03/2009     Past Surgical History   Past Surgical History:  Procedure Laterality Date   CERVICAL DISC ARTHROPLASTY     TONSILLECTOMY     TUBAL LIGATION       Home Medications   Prior to Admission medications   Medication Sig Start Date End Date Taking? Authorizing Provider  acetaminophen (TYLENOL) 500 MG tablet Take 500-1,000 mg by mouth every 6 (six) hours as needed for mild pain or moderate pain.    [provider]  albuterol (VENTOLIN HFA) 108 (90 Base) MCG/ACT inhaler Inhale 1-2 puffs into the lungs every 6 (six) hours as needed for wheezing or shortness of breath.    [provider]  azaTHIOprine (IMURAN) 50 MG tablet Take 50 mg by mouth daily.    [provider]  azithromycin (ZITHROMAX) 500 MG tablet Take 1 tablet (500 mg total) by mouth daily for 3 days. 08/22/23 08/25/23  Meredeth Ide, MD  benzonatate (TESSALON PERLES) 100 MG capsule Take 1 capsule (100 mg total) by mouth 3 (three) times daily as needed for cough. 08/21/23 08/20/24  Meredeth Ide, MD  Buprenorphine HCl-Naloxone HCl 8-2 MG FILM Place 1 Film under the tongue in the morning, at  noon, and at bedtime.    [provider]  cyclobenzaprine (FLEXERIL) 10 MG tablet Take 1 tablet by mouth 3 (three) times daily. 03/03/20   [provider]  escitalopram (LEXAPRO) 20 MG tablet Take 20 mg by mouth daily.    [provider]  famotidine (PEPCID) 20 MG tablet Take 20 mg by mouth 2 (two) times daily.    [provider]  fluticasone (FLONASE) 50 MCG/ACT nasal spray Place 1 spray into both nostrils daily.    [provider]   fluticasone-salmeterol (WIXELA INHUB) 500-50 MCG/ACT AEPB Inhale 1 puff into the lungs in the morning and at bedtime.    [provider]  furosemide (LASIX) 40 MG tablet Take 40 mg by mouth daily.    [provider]  guaiFENesin (MUCINEX) 600 MG 12 hr tablet Take 1 tablet (600 mg total) by mouth 2 (two) times daily. 08/21/23   Meredeth Ide, MD  hydrOXYzine (ATARAX) 25 MG tablet Take 25 mg by mouth 4 (four) times daily. 06/06/23   [provider]  ipratropium-albuterol (DUONEB) 0.5-2.5 (3) MG/3ML SOLN Take 3 mLs by nebulization.    [provider]  methotrexate (RHEUMATREX) 2.5 MG tablet Take 20 mg by mouth once a week. Patient not taking: Reported on 02/08/2022 07/11/19   [provider]  naloxone [facility]) nasal spray 4 mg/0.1 mL Place 0.4 mg into the nose once. 04/26/23   [provider]  nicotine (NICODERM CQ - DOSED IN MG/24 HOURS) 14 mg/24hr patch Place 14 mg onto the skin daily. Patient not taking: Reported on 08/20/2023    [provider]  nortriptyline (PAMELOR) 50 MG capsule Take 50 mg by mouth at bedtime.    [provider]  OLANZapine (ZYPREXA) 5 MG tablet Take 5 mg by mouth at bedtime.    [provider]  OXYGEN Inhale 2 L into the lungs daily. continuous    [provider]  predniSONE (DELTASONE) 10 MG tablet Take 10 mg by mouth daily with breakfast.    [provider]  predniSONE (DELTASONE) 5 MG tablet Take 5-25 mg by mouth daily with breakfast. 08/08/23   [provider]  pregabalin (LYRICA) 100 MG capsule Take 1 capsule by mouth in the morning, at noon, and at bedtime. 03/03/20   [provider]  rizatriptan (MAXALT) 5 MG tablet Take 1 tablet by mouth 2 (two) times daily as needed. Patient not taking: Reported on 02/08/2022 01/18/20   [provider]  SPIRIVA RESPIMAT 2.5 MCG/ACT AERS Inhale 1 puff into the lungs daily. 06/24/23   [provider]   SYNTHROID 50 MCG tablet Take 50 mcg by mouth daily. 07/28/23   [provider]     Allergies  Gabapentin, Trazodone and nefazodone, Acetaminophen, Amoxicillin-pot clavulanate, Baclofen, Tetanus toxoids, Tramadol, and Shellfish allergy   Family History   Family History  Problem Relation Age of Onset   Arthritis Mother    Asthma Mother    Diabetes Mother    Hyperlipidemia Mother    Vision loss Mother    Alcohol abuse Father    Asthma Father    Diabetes Father    Heart disease Father    Depression Father    COPD Father    Cancer Father    Hypertension Father    Kidney disease Father    Vision loss Father      Physical Exam  Triage Vital Signs: ED Triage Vitals  Encounter Vitals Group     BP 08/23/23  0203 125/72     Systolic BP Percentile --      Diastolic BP Percentile --      Pulse Rate 08/23/23 0203 (!) 114     Resp 08/23/23 0203 (!) 22     Temp 08/23/23 0203 98.5 F (36.9 C)     Temp Source 08/23/23 0203 Oral     SpO2 08/23/23 0203 (!) 75 %     Weight 08/23/23 0202 199 lb 15.3 oz (90.7 kg)     Height 08/23/23 0202 5\' 3"  (1.6 m)     Head Circumference --      Peak Flow --      Pain Score 08/23/23 0203 9     Pain Loc --      Pain Education --      Exclude from Growth Chart --     Updated Vital Signs: BP 116/71   Pulse 93   Temp (!) 97.5 F (36.4 C) (Axillary)   Resp 13   Ht 5\' 3"  (1.6 m)   Wt 90.7 kg   LMP 08/03/2023 (Exact Date)   SpO2 99%   BMI 35.42 kg/m    General: Awake, moderate distress.   CV:  Tachycardic.  Good peripheral perfusion.  Resp:  Increased effort.  Retractions.  Diminished aeration. Abd:  Nontender, obese.  No distention.  Other:  No pedal edema.   ED Results / Procedures / Treatments  Labs (all labs ordered are listed, but only abnormal results are displayed) Labs Reviewed  BASIC METABOLIC PANEL - Abnormal; Notable for the following components:      Result Value   Chloride 91 (*)    CO2 33 (*)    Calcium  8.6 (*)    All other components within normal limits  CBC - Abnormal; Notable for the following components:   WBC 20.3 (*)    RBC 3.37 (*)    Hemoglobin 10.5 (*)    HCT 33.6 (*)    All other components within normal limits  BRAIN NATRIURETIC PEPTIDE - Abnormal; Notable for the following components:   B Natriuretic Peptide 117.5 (*)    All other components within normal limits  BLOOD GAS, ARTERIAL - Abnormal; Notable for the following components:   pCO2 arterial 68 (*)    pO2, Arterial 121 (*)    Bicarbonate 43.1 (*)    Acid-Base Excess 15.0 (*)    All other components within normal limits  BLOOD GAS, ARTERIAL - Abnormal; Notable for the following components:   pCO2 arterial 61 (*)    pO2, Arterial 116 (*)    Bicarbonate 39.6 (*)    Acid-Base Excess 12.4 (*)    All other components within normal limits  RESP PANEL BY RT-PCR (RSV, FLU A&B, COVID)  RVPGX2  CULTURE, BLOOD (ROUTINE X 2)  CULTURE, BLOOD (ROUTINE X 2)  LACTIC ACID, PLASMA  LACTIC ACID, PLASMA  POC URINE PREG, ED  TROPONIN I (HIGH SENSITIVITY)  TROPONIN I (HIGH SENSITIVITY)     EKG  ED ECG REPORT I, Shakeerah Gradel J, the attending physician, personally viewed and interpreted this ECG.   Date: 08/23/2023  EKG Time: 0206  Rate: 106  Rhythm: sinus tachycardia  Axis: Normal  Intervals:none  ST&T Change: Nonspecific    RADIOLOGY I have independently visualized and interpreted patient's imaging study as well as noted the radiology interpretation:  Chest x-ray: Improved bilateral interstitial opacities  Official radiology report(s): DG Chest Port 1 View Result Date: 08/23/2023 CLINICAL DATA:  Chest pain. EXAM: PORTABLE  CHEST 1 VIEW COMPARISON:  August 19, 2023 FINDINGS: The heart size and mediastinal contours are within normal limits. Mild diffuse bilateral interstitial opacities are seen which is decreased in severity when compared to the prior study. Mild areas of atelectasis and/or infiltrate are also seen  within the bilateral lung bases, left greater than right. No pleural effusion or pneumothorax is identified. A radiopaque fusion plate and screws are seen overlying the lower cervical spine. No acute osseous abnormalities are identified. IMPRESSION: 1. Mild diffuse bilateral interstitial opacities which is decreased in severity when compared to the prior study. 2. Mild bibasilar atelectasis and/or infiltrate, left greater than right. Electronically Signed   By: Aram Candela M.D.   On: 08/23/2023 03:22     PROCEDURES:  Critical Care performed: Yes, see critical care procedure note(s)  CRITICAL CARE Performed by: Irean Hong   Total critical care time: 45 minutes  Critical care time was exclusive of separately billable procedures and treating other patients.  Critical care was necessary to treat or prevent imminent or life-threatening deterioration.  Critical care was time spent personally by me on the following activities: development of treatment plan with patient and/or surrogate as well as nursing, discussions with consultants, evaluation of patient's response to treatment, examination of patient, obtaining history from patient or surrogate, ordering and performing treatments and interventions, ordering and review of laboratory studies, ordering and review of radiographic studies, pulse oximetry and re-evaluation of patient's condition.   Marland Kitchen1-3 Lead EKG Interpretation  Performed by: Irean Hong, MD Authorized by: Irean Hong, MD     Interpretation: abnormal     ECG rate:  115   ECG rate assessment: tachycardic     Rhythm: sinus tachycardia     Ectopy: none     Conduction: normal   Comments:     Patient placed on cardiac monitor to evaluate for arrhythmias    MEDICATIONS ORDERED IN ED: Medications  sodium chloride 0.9 % bolus 500 mL (0 mLs Intravenous Stopped 08/23/23 0500)  cefTRIAXone (ROCEPHIN) 1 g in sodium chloride 0.9 % 100 mL IVPB (0 g Intravenous Stopped 08/23/23  0348)  azithromycin (ZITHROMAX) 500 mg in sodium chloride 0.9 % 250 mL IVPB (0 mg Intravenous Stopped 08/23/23 0404)  ipratropium-albuterol (DUONEB) 0.5-2.5 (3) MG/3ML nebulizer solution 3 mL (3 mLs Nebulization Given 08/23/23 0245)  methylPREDNISolone sodium succinate (SOLU-MEDROL) 125 mg/2 mL injection 125 mg (125 mg Intravenous Given 08/23/23 0245)     IMPRESSION / MDM / ASSESSMENT AND PLAN / ED COURSE  I reviewed the triage vital signs and the nursing notes.                             38 year old female who presents with acute on chronic respiratory failure with hypoxia. Differential includes, but is not limited to, viral syndrome, bronchitis including COPD exacerbation, pneumonia, reactive airway disease including asthma, CHF including exacerbation with or without pulmonary/interstitial edema, pneumothorax, ACS, thoracic trauma, and pulmonary embolism.  I have personally reviewed patient's records and note her recent hospitalization from 08/16/2023 for same.  Patient's presentation is most consistent with acute presentation with potential threat to life or bodily function.  The patient is on the cardiac monitor to evaluate for evidence of arrhythmia and/or significant heart rate changes.  Will obtain sepsis workup, chest x-ray.  Patient continues to desaturate despite increasing nasal cannula oxygen.  Will trial BiPAP.  Obtain ABG.  Anticipate hospitalization.  Clinical Course as of  08/23/23 0711  Tue Aug 23, 2023  0314 ABG 7.4 1/68/121; patient placed on BiPAP.  Will consult hospitalist services for evaluation and admission. [JS]  [id number] Hospitalist requesting to cancel consult and observe for 1 hour on BiPAP to ensure she does not require ICU level care. [JS]  0500 Improved ABG, hospital services reconsulted for admission. [JS]    Clinical Course User Index [JS] Irean Hong, MD     FINAL CLINICAL IMPRESSION(S) / ED DIAGNOSES   Final diagnoses:  Acute on chronic respiratory failure  with hypoxia (HCC)  Community acquired pneumonia, unspecified laterality  Chronic obstructive pulmonary disease with acute exacerbation (HCC)  Hypercapnia     Rx / DC Orders   ED Discharge Orders     None        Note:  This document was prepared using Dragon voice recognition software and may include unintentional dictation errors.   Irean Hong, MD 08/23/23 [phone]

## 2023-08-23 NOTE — ED Notes (Signed)
Writer RN contacts Librarian, academic for assistance with pt desaturation to 74% on hi flow resp tech contacted for [employer] assistance

## 2023-08-24 LAB — RESPIRATORY PANEL BY PCR

## 2023-08-24 LAB — CULTURE, BLOOD (ROUTINE X 2)
Culture: NO GROWTH
Special Requests: ADEQUATE

## 2023-08-28 LAB — CULTURE, BLOOD (ROUTINE X 2)
Culture: NO GROWTH
Culture: NO GROWTH

## 2024-02-12 ENCOUNTER — Emergency Department
Admission: EM | Admit: 2024-02-12 | Discharge: 2024-02-12 | Disposition: A | Discharge status: Home / Self Care | Attending: Emergency Medicine | Admitting: Emergency Medicine

## 2024-02-12 ENCOUNTER — Other Ambulatory Visit: Payer: Self-pay

## 2024-02-12 ENCOUNTER — Emergency Department

## 2024-02-12 DIAGNOSIS — F22 Delusional disorders: Secondary | ICD-10-CM

## 2024-02-12 DIAGNOSIS — J45909 Unspecified asthma, uncomplicated: Secondary | ICD-10-CM | POA: Diagnosis not present

## 2024-02-12 DIAGNOSIS — R0600 Dyspnea, unspecified: Secondary | ICD-10-CM

## 2024-02-12 DIAGNOSIS — J42 Unspecified chronic bronchitis: Secondary | ICD-10-CM | POA: Diagnosis not present

## 2024-02-12 DIAGNOSIS — D72829 Elevated white blood cell count, unspecified: Secondary | ICD-10-CM | POA: Diagnosis not present

## 2024-02-12 DIAGNOSIS — F319 Bipolar disorder, unspecified: Secondary | ICD-10-CM

## 2024-02-12 DIAGNOSIS — R0602 Shortness of breath: Secondary | ICD-10-CM | POA: Diagnosis present

## 2024-02-12 DIAGNOSIS — E876 Hypokalemia: Secondary | ICD-10-CM | POA: Insufficient documentation

## 2024-02-12 DIAGNOSIS — F1410 Cocaine abuse, uncomplicated: Secondary | ICD-10-CM

## 2024-02-12 LAB — BLOOD GAS, VENOUS

## 2024-02-12 LAB — BASIC METABOLIC PANEL WITH GFR
Anion gap: 9 (ref 5–15)
BUN: 8 mg/dL (ref 6–20)
CO2: 30 mmol/L (ref 22–32)
Calcium: 8.9 mg/dL (ref 8.9–10.3)
Chloride: 99 mmol/L (ref 98–111)
Creatinine, Ser: 0.36 mg/dL — ABNORMAL LOW (ref 0.44–1.00)
GFR, Estimated: >60 mL/min (ref >60)
Glucose, Bld: 153 mg/dL — ABNORMAL HIGH (ref 70–99)
Potassium: 3.4 mmol/L — ABNORMAL LOW (ref 3.5–5.1)
Sodium: 138 mmol/L (ref 135–145)

## 2024-02-12 LAB — URINE DRUG SCREEN, QUALITATIVE (ARMC ONLY)
Amphetamines, Ur Screen: NOT DETECTED
Barbiturates, Ur Screen: NOT DETECTED
Benzodiazepine, Ur Scrn: NOT DETECTED
Cannabinoid 50 Ng, Ur ~~LOC~~: NOT DETECTED
Cocaine Metabolite,Ur ~~LOC~~: POSITIVE — AB
MDMA (Ecstasy)Ur Screen: NOT DETECTED
Methadone Scn, Ur: NOT DETECTED
Opiate, Ur Screen: NOT DETECTED
Phencyclidine (PCP) Ur S: NOT DETECTED
Tricyclic, Ur Screen: POSITIVE — AB

## 2024-02-12 LAB — URINALYSIS, ROUTINE W REFLEX MICROSCOPIC
Glucose, UA: NEGATIVE mg/dL
Hgb urine dipstick: NEGATIVE
Ketones, ur: 5 mg/dL — AB
Leukocytes,Ua: NEGATIVE
Nitrite: NEGATIVE
Protein, ur: 100 mg/dL — AB
Specific Gravity, Urine: 1.031 — ABNORMAL HIGH (ref 1.005–1.030)
pH: 5 (ref 5.0–8.0)

## 2024-02-12 LAB — CBC
HCT: 42.9 % (ref 36.0–46.0)
Hemoglobin: 14.1 g/dL (ref 12.0–15.0)
MCH: 30.3 pg (ref 26.0–34.0)
MCHC: 32.9 g/dL (ref 30.0–36.0)
MCV: 92.3 fL (ref 80.0–100.0)
Platelets: 272 K/uL (ref 150–400)
RBC: 4.65 MIL/uL (ref 3.87–5.11)
RDW: 15.3 % (ref 11.5–15.5)
WBC: 16.5 K/uL — ABNORMAL HIGH (ref 4.0–10.5)
nRBC: 0 % (ref 0.0–0.2)

## 2024-02-12 LAB — D-DIMER, QUANTITATIVE: D-Dimer, Quant: 0.36 ug{FEU}/mL (ref 0.00–0.50)

## 2024-02-12 LAB — HCG, QUANTITATIVE, PREGNANCY: hCG, Beta Chain, Quant, S: 1 m[IU]/mL (ref <5)

## 2024-02-12 MED ORDER — AZITHROMYCIN 250 MG PO TABS: 250.0000 mg | ORAL_TABLET | Freq: Every day | ORAL | Status: DC

## 2024-02-12 MED ORDER — PREGABALIN 50 MG PO CAPS
100.0000 mg | ORAL_CAPSULE | Freq: Three times a day (TID) | ORAL | Status: DC
  Administered 2024-02-12: 100 mg via ORAL
  Filled 2024-02-12: qty 2

## 2024-02-12 MED ORDER — AZITHROMYCIN 500 MG PO TABS
500.0000 mg | ORAL_TABLET | Freq: Once | ORAL | Status: AC
  Administered 2024-02-12: 500 mg via ORAL
  Filled 2024-02-12: qty 1

## 2024-02-12 MED ORDER — PREDNISONE 10 MG PO TABS
10.0000 mg | ORAL_TABLET | Freq: Every day | ORAL | Status: DC
  Administered 2024-02-12: 10 mg via ORAL
  Filled 2024-02-12: qty 1

## 2024-02-12 MED ORDER — HYDROXYZINE HCL 25 MG PO TABS: 25.0000 mg | ORAL_TABLET | Freq: Four times a day (QID) | ORAL | Status: DC | PRN

## 2024-02-12 MED ORDER — LEVOTHYROXINE SODIUM 50 MCG PO TABS
50.0000 ug | ORAL_TABLET | Freq: Every day | ORAL | Status: DC
  Administered 2024-02-12: 50 ug via ORAL
  Filled 2024-02-12: qty 1

## 2024-02-12 MED ORDER — LORAZEPAM 2 MG/ML IJ SOLN
1.0000 mg | Freq: Once | INTRAMUSCULAR | Status: AC
  Administered 2024-02-12: 1 mg via INTRAVENOUS
  Filled 2024-02-12: qty 1

## 2024-02-12 MED ORDER — OLANZAPINE 5 MG PO TABS: 5.0000 mg | ORAL_TABLET | Freq: Every day | ORAL | Status: DC

## 2024-02-12 MED ORDER — UMECLIDINIUM BROMIDE 62.5 MCG/ACT IN AEPB
1.0000 | INHALATION_SPRAY | Freq: Every day | RESPIRATORY_TRACT | Status: DC
  Administered 2024-02-12: 1 via RESPIRATORY_TRACT
  Filled 2024-02-12 (×2): qty 7

## 2024-02-12 MED ORDER — IPRATROPIUM-ALBUTEROL 0.5-2.5 (3) MG/3ML IN SOLN
6.0000 mL | Freq: Once | RESPIRATORY_TRACT | Status: AC
  Administered 2024-02-12: 6 mL via RESPIRATORY_TRACT
  Filled 2024-02-12: qty 6

## 2024-02-12 MED ORDER — FUROSEMIDE 40 MG PO TABS
40.0000 mg | ORAL_TABLET | Freq: Every day | ORAL | Status: DC
  Administered 2024-02-12: 40 mg via ORAL
  Filled 2024-02-12: qty 1

## 2024-02-12 MED ORDER — IPRATROPIUM-ALBUTEROL 0.5-2.5 (3) MG/3ML IN SOLN: 3.0000 mL | RESPIRATORY_TRACT | Status: DC | PRN

## 2024-02-12 MED ORDER — IPRATROPIUM-ALBUTEROL 0.5-2.5 (3) MG/3ML IN SOLN
3.0000 mL | Freq: Once | RESPIRATORY_TRACT | Status: AC
  Administered 2024-02-12: 3 mL via RESPIRATORY_TRACT
  Filled 2024-02-12: qty 3

## 2024-02-12 MED ORDER — ESCITALOPRAM OXALATE 10 MG PO TABS
20.0000 mg | ORAL_TABLET | Freq: Every day | ORAL | Status: DC
  Administered 2024-02-12: 20 mg via ORAL
  Filled 2024-02-12: qty 2

## 2024-02-12 MED ORDER — NORTRIPTYLINE HCL 25 MG PO CAPS: 50.0000 mg | ORAL_CAPSULE | Freq: Every day | ORAL | Status: DC

## 2024-02-12 MED ORDER — TIOTROPIUM BROMIDE MONOHYDRATE 2.5 MCG/ACT IN AERS: 1.0000 | INHALATION_SPRAY | Freq: Every day | RESPIRATORY_TRACT | Status: DC

## 2024-02-12 NOTE — ED Notes (Signed)
 Patient given graham crackers as requested and patient phone to call ride home.

## 2024-02-12 NOTE — ED Notes (Signed)
Psych team at bedside at this time.
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
## 2024-02-12 NOTE — ED Provider Notes (Signed)
 Seen and evaluated by psychiatry who recommends outpatient management, recreational drug use cessation and adherence to her prescribed medications.  She does require a single additional nebulizer and subsequently has resolution of wheezing and reports her breathing feels fine.  She is suitable for outpatient management.   Claudene Rover, MD 02/12/24 1044

## 2024-02-12 NOTE — BH Assessment (Incomplete)
 Comprehensive Clinical Assessment (CCA) Screening, Triage and Referral Note  02/12/2024 PEGGYE POON [id number]  Chief Complaint:  Chief Complaint  Patient presents with   Shortness of Breath   Visit Diagnosis: Cocaine Use Disorder  Jennifer Vasquez is a 38 year old female who presents to the due to shortness of breath and cocaine abuse. While in the ER, the patient shared things were delusional and paranoid in nature but it was due to her substance use. Patient denies SI/HI and AV/H.   Patient Reported Information How did you hear about us ? Self  What Is the Reason for Your Visit/Call Today? Brought to the ER due to shortness of breath and cocaine abuse.  How Long Has This Been Causing You Problems? > than 6 months  What Do You Feel Would Help You the Most Today? Alcohol or Drug Use Treatment   Have You Recently Had Any Thoughts About Hurting Yourself? No  Are You Planning to Commit Suicide/Harm Yourself At This time? No   Have you Recently Had Thoughts About Hurting Someone [place]? No  Are You Planning to Harm Someone at This Time? No  Explanation: No data recorded  Have You Used Any Alcohol or Drugs in the Past 24 Hours? Yes  How Long Ago Did You Use Drugs or Alcohol? Since the age of 30  What Did You Use and How Much? Cocaine   Do You Currently Have a Therapist/Psychiatrist? No  Name of Therapist/Psychiatrist: No data recorded  Have You Been Recently Discharged From Any Office Practice or Programs? No  Explanation of Discharge From Practice/Program: No data recorded   CCA Screening Triage Referral Assessment Type of Contact: Face-to-Face  Telemedicine Service Delivery:   Is this Initial or Reassessment?   Date Telepsych consult ordered in [facility]:    Time Telepsych consult ordered in [facility]:    Location of Assessment: [facility] ED  Provider Location: [facility] ED    Collateral Involvement: No data recorded  Does Patient Have a Court Appointed Legal Guardian? No data  recorded Name and Contact of Legal Guardian: No data recorded If Minor and Not Living with Parent(s), Who has Custody? No data recorded Is CPS involved or ever been involved? Never  Is APS involved or ever been involved? Never   Patient Determined To Be At Risk for Harm To Self or Others Based on Review of Patient Reported Information or Presenting Complaint? No  Method: No data recorded Availability of Means: No data recorded Intent: No data recorded Notification Required: No data recorded Additional Information for Danger to Others Potential: No data recorded Additional Comments for Danger to Others Potential: No data recorded Are There Guns or Other Weapons in Your Home? No data recorded Types of Guns/Weapons: No data recorded Are These Weapons Safely Secured?                            No data recorded Who Could Verify You Are Able To Have These Secured: No data recorded Do You Have any Outstanding Charges, Pending Court Dates, Parole/Probation? No data recorded Contacted To Inform of Risk of Harm To Self or Others: No data recorded  Does Patient Present under Involuntary Commitment? No    [place] of Residence: [place]   Patient Currently Receiving the Following Services: Not Receiving Services   Determination of Need: Emergent (2 hours)   Options For Referral: ED Visit   Disposition Recommendation per psychiatric provider: There are no psychiatric contraindications to discharge at  this time  Kiki DOROTHA Barge MS, LCAS, [facility], [facility] Therapeutic Triage Specialist 02/12/2024 9:14 AM
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
## 2024-02-12 NOTE — ED Triage Notes (Signed)
 Pt presented to ED BIBA from home with c/o shortness of breath x 2 hours. States last took a breathing treatment at 1900 last night. Pt also endorses cough x 2 months. States wears 4L via [place] home O2. Hx COPD. Endorses chest pain.

## 2024-02-13 LAB — BLOOD GAS, VENOUS
Bicarbonate: 36.3 mmol/L — ABNORMAL HIGH (ref 20.0–28.0)
O2 Saturation: 57.2 mmol/L — AB (ref 0.0–2.0)
Patient temperature: 37
Patient temperature: 57.2
pCO2, Ven: 56 mmHg (ref 44–60)
pH, Ven: 7.42 (ref 7.25–7.43)
pO2, Ven: 36.3 mmol/L — AB (ref 32–45)

## 2024-06-08 ENCOUNTER — Emergency Department
Admission: EM | Admit: 2024-06-08 | Discharge: 2024-06-08 | Disposition: A | Discharge status: Home / Self Care | Attending: Emergency Medicine | Admitting: Emergency Medicine

## 2024-06-08 ENCOUNTER — Emergency Department

## 2024-06-08 ENCOUNTER — Other Ambulatory Visit: Payer: Self-pay

## 2024-06-08 DIAGNOSIS — S92352A Displaced fracture of fifth metatarsal bone, left foot, initial encounter for closed fracture: Secondary | ICD-10-CM | POA: Diagnosis not present

## 2024-06-08 DIAGNOSIS — J4489 Other specified chronic obstructive pulmonary disease: Secondary | ICD-10-CM | POA: Insufficient documentation

## 2024-06-08 DIAGNOSIS — Z720 Tobacco use: Secondary | ICD-10-CM | POA: Diagnosis not present

## 2024-06-08 DIAGNOSIS — X509XXA Other and unspecified overexertion or strenuous movements or postures, initial encounter: Secondary | ICD-10-CM | POA: Insufficient documentation

## 2024-06-08 DIAGNOSIS — Y9301 Activity, walking, marching and hiking: Secondary | ICD-10-CM | POA: Insufficient documentation

## 2024-06-08 DIAGNOSIS — S99192A Other physeal fracture of left metatarsal, initial encounter for closed fracture: Secondary | ICD-10-CM

## 2024-06-08 DIAGNOSIS — S99922A Unspecified injury of left foot, initial encounter: Secondary | ICD-10-CM | POA: Diagnosis present

## 2024-06-08 MED ORDER — OXYCODONE HCL 5 MG PO TABS
5.0000 mg | ORAL_TABLET | Freq: Once | ORAL | Status: AC
  Administered 2024-06-08: 5 mg via ORAL
  Filled 2024-06-08: qty 1

## 2024-06-08 NOTE — ED Notes (Signed)
 Pt has [employer] taking her home

## 2024-06-08 NOTE — ED Triage Notes (Addendum)
 Pt comes via EMS from home with left foot injury. Pt has some swelling noted. Pt was going down steps and heard pop. Pt states took some pain med of tylenol .   Pt does wear 4L [place] chronically.\

## 2024-06-08 NOTE — Discharge Instructions (Addendum)
 Keep your foot in the boot and remain nonweightbearing.  Please follow-up with podiatry, call them today to make an appointment.  Please keep your leg elevated.  Return for any new, worsening, or changing symptoms or other concerns.  It was a pleasure caring for you today.

## 2024-06-08 NOTE — ED Provider Notes (Signed)
 [facility] Provider Note    Event Date/Time   First MD Initiated Contact with Patient 06/08/24 1058     (approximate)   History   Foot Pain   HPI  Jennifer Vasquez is a 38 y.o. female with a past medical history of tobacco use, on chronic oxygen, who presents today for evaluation of foot pain.  Patient reports that she was walking down the stairs and she felt a pop in her foot.  She has been unable to bear weight since.  No numbness or tingling.  Pain is on the lateral aspect of her foot.  No other injury sustained.  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   Multifocal pneumonia 09/21/2019   Sepsis due to pneumonia (HCC) 07/05/2018   Community acquired pneumonia    Respiratory failure (HCC) 11/05/2016   DDD (degenerative disc disease), lumbar 11/13/2014   DDD (degenerative disc disease), cervical 11/13/2014   Lumbosacral radiculopathy 11/13/2014   Acute carpal tunnel syndrome 10/10/2014   Chronic pain 03/02/2013   Depression 03/02/2013   Bipolar 1 disorder (HCC) 02/09/2013   Hepatitis C 12/18/2012   Asthma 11/23/2012   Neck pain 11/23/2012   Torticollis 11/23/2012   Tobacco use disorder 09/06/2012   Anxiety state 09/03/2009          Physical Exam   Triage Vital Signs: ED Triage Vitals  Encounter Vitals Group     BP 06/08/24 1032 117/65     Girls Systolic BP  Percentile --      Girls Diastolic BP Percentile --      Boys Systolic BP Percentile --      Boys Diastolic BP Percentile --      Pulse Rate 06/08/24 1032 89     Resp 06/08/24 1032 18     Temp 06/08/24 1032 98.6 F (37 C)     Temp Source 06/08/24 1032 Oral     SpO2 06/08/24 1032 98 %     Weight --      Height --      Head Circumference --      Peak Flow --      Pain Score 06/08/24 1022 10     Pain Loc --      Pain Education --      Exclude from Growth Chart --     Most recent vital signs: Vitals:   06/08/24 1100 06/08/24 1248  BP:  122/71  Pulse:  90  Resp:  18  Temp:    SpO2: 100% 100%    Physical Exam Vitals and nursing note reviewed.  Constitutional:      General: Awake and alert. No acute distress.    Appearance: Normal appearance.  HENT:     Head: Normocephalic and atraumatic.     Mouth: Mucous membranes are moist.  Eyes:     General: PERRL. Normal EOMs        Right eye: No discharge.        Left  eye: No discharge.     Conjunctiva/sclera: Conjunctivae normal.  Cardiovascular:     Rate and Rhythm: Normal rate and regular rhythm.     Pulses: Normal pulses.  Pulmonary:     Effort: Pulmonary effort is normal. No respiratory distress.  On O2 via [place], at baseline    Breath sounds: Normal breath sounds.  Abdominal:     Abdomen is soft. There is no abdominal tenderness. No rebound or guarding. No distention. Musculoskeletal:        General: No swelling. Normal range of motion.     Cervical back: Normal range of motion and neck supple.  Left foot: Tenderness to palpation to the lateral aspect of the left foot.  No erythema, ecchymosis, or wounds noted.  Sensation intact light touch throughout.  Normal pedal pulse.  No plantar ecchymosis. Skin:    General: Skin is warm and dry.     Capillary Refill: Capillary refill takes less than 2 seconds.     Findings: No rash.  Neurological:     Mental Status: The patient is awake and alert.      ED Results / Procedures  / Treatments   Labs (all labs ordered are listed, but only abnormal results are displayed) Labs Reviewed - No data to display   EKG     RADIOLOGY I independently reviewed and interpreted imaging and agree with radiologists findings.     PROCEDURES:  Critical Care performed:   Procedures   MEDICATIONS ORDERED IN ED: Medications  oxyCODONE  (Oxy IR/ROXICODONE ) immediate release tablet 5 mg (5 mg Oral Given 06/08/24 1153)     IMPRESSION / MDM / ASSESSMENT AND PLAN / ED COURSE  I reviewed the triage vital signs and the nursing notes.   Differential diagnosis includes, but is not limited to, fracture, sprain, contusion.  Patient is awake and alert, hemodynamically stable and afebrile.  She has normal 2+ pedal pulses, foot is warm well-perfused, no open wounds.  Further workup is indicated.  X-ray obtained of her foot reveals a nondisplaced Jones fracture.  Discussed this diagnosis with the patient and the importance of close outpatient follow-up with podiatry.  The appropriate follow-up information was provided.  She was given a cam boot and crutches.  She is able to demonstrate proper usage of the crutches.  She feels comfortable using them.  We discussed rest, ice, elevation and she was instructed to remain nonweightbearing.  She agrees to call the podiatrist today for an appointment next week.  She understands return precautions in the meantime which we discussed at length.  She was treated with 1 dose of oxycodone  in the emergency department per her request.  We discussed return precautions and outpatient follow-up.  Patient understands and agrees with plan.  Discharged in stable condition.   Patient's presentation is most consistent with acute complicated illness / injury requiring diagnostic workup.    FINAL CLINICAL IMPRESSION(S) / ED DIAGNOSES   Final diagnoses:  Closed fracture of base of fifth metatarsal bone of left foot at metaphyseal-diaphyseal junction,  initial encounter     Rx / DC Orders   ED Discharge Orders     None        Note:  This document was prepared using Dragon voice recognition software and may include unintentional dictation errors.   Darden Flemister E, PA-C 06/08/24 1312    Arlander Charleston, MD 06/08/24 [phone]
